# Patient Record
Sex: Male | Born: 1980 | Race: Black or African American | Hispanic: No | Smoking: Former smoker
Health system: Southern US, Community
[De-identification: ages and names within clinical notes are randomized; demographics above are authoritative.]

## PROBLEM LIST (undated history)

## (undated) ENCOUNTER — Emergency Department (HOSPITAL_COMMUNITY): Discharge status: Absent without leave | Payer: Self-pay

## (undated) DIAGNOSIS — Z9109 Other allergy status, other than to drugs and biological substances: Secondary | ICD-10-CM

## (undated) DIAGNOSIS — K469 Unspecified abdominal hernia without obstruction or gangrene: Secondary | ICD-10-CM

## (undated) DIAGNOSIS — I10 Essential (primary) hypertension: Secondary | ICD-10-CM

## (undated) DIAGNOSIS — Z8679 Personal history of other diseases of the circulatory system: Secondary | ICD-10-CM

## (undated) DIAGNOSIS — J45909 Unspecified asthma, uncomplicated: Secondary | ICD-10-CM

## (undated) DIAGNOSIS — J4540 Moderate persistent asthma, uncomplicated: Secondary | ICD-10-CM

## (undated) DIAGNOSIS — R002 Palpitations: Secondary | ICD-10-CM

## (undated) DIAGNOSIS — S8991XA Unspecified injury of right lower leg, initial encounter: Secondary | ICD-10-CM

## (undated) HISTORY — DX: Essential (primary) hypertension: I10

---
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
## 2001-03-25 ENCOUNTER — Emergency Department (HOSPITAL_COMMUNITY): Admission: EM | Admit: 2001-03-25 | Discharge: 2001-03-25 | Payer: Self-pay

## 2001-04-10 ENCOUNTER — Emergency Department (HOSPITAL_COMMUNITY): Admission: EM | Admit: 2001-04-10 | Discharge: 2001-04-10 | Payer: Self-pay | Admitting: Emergency Medicine

## 2001-04-10 ENCOUNTER — Encounter: Payer: Self-pay | Admitting: Emergency Medicine

## 2001-11-22 ENCOUNTER — Encounter: Payer: Self-pay | Admitting: Emergency Medicine

## 2001-11-22 ENCOUNTER — Emergency Department (HOSPITAL_COMMUNITY): Admission: EM | Admit: 2001-11-22 | Discharge: 2001-11-22 | Payer: Self-pay | Admitting: Emergency Medicine

## 2002-02-27 ENCOUNTER — Emergency Department (HOSPITAL_COMMUNITY): Admission: EM | Admit: 2002-02-27 | Discharge: 2002-02-27 | Payer: Self-pay | Admitting: Emergency Medicine

## 2002-02-27 ENCOUNTER — Encounter: Payer: Self-pay | Admitting: Emergency Medicine

## 2002-09-17 ENCOUNTER — Encounter: Payer: Self-pay | Admitting: Emergency Medicine

## 2002-09-17 ENCOUNTER — Emergency Department (HOSPITAL_COMMUNITY): Admission: EM | Admit: 2002-09-17 | Discharge: 2002-09-17 | Payer: Self-pay | Admitting: Emergency Medicine

## 2003-09-27 ENCOUNTER — Emergency Department (HOSPITAL_COMMUNITY): Admission: EM | Admit: 2003-09-27 | Discharge: 2003-09-27 | Payer: Self-pay | Admitting: Emergency Medicine

## 2004-01-16 ENCOUNTER — Emergency Department (HOSPITAL_COMMUNITY): Admission: AD | Admit: 2004-01-16 | Discharge: 2004-01-16 | Payer: Self-pay | Admitting: Family Medicine

## 2004-02-23 ENCOUNTER — Emergency Department (HOSPITAL_COMMUNITY): Admission: EM | Admit: 2004-02-23 | Discharge: 2004-02-23 | Payer: Self-pay | Admitting: Emergency Medicine

## 2004-04-12 ENCOUNTER — Emergency Department (HOSPITAL_COMMUNITY): Admission: EM | Admit: 2004-04-12 | Discharge: 2004-04-12 | Payer: Self-pay | Admitting: *Deleted

## 2005-04-02 ENCOUNTER — Emergency Department (HOSPITAL_COMMUNITY): Admission: EM | Admit: 2005-04-02 | Discharge: 2005-04-02 | Payer: Self-pay | Admitting: Emergency Medicine

## 2006-04-03 ENCOUNTER — Emergency Department (HOSPITAL_COMMUNITY): Admission: EM | Admit: 2006-04-03 | Discharge: 2006-04-03 | Payer: Self-pay | Admitting: Family Medicine

## 2006-10-17 ENCOUNTER — Emergency Department (HOSPITAL_COMMUNITY): Admission: EM | Admit: 2006-10-17 | Discharge: 2006-10-17 | Payer: Self-pay | Admitting: Family Medicine

## 2007-02-10 ENCOUNTER — Emergency Department (HOSPITAL_COMMUNITY): Admission: EM | Admit: 2007-02-10 | Discharge: 2007-02-11 | Payer: Self-pay | Admitting: Emergency Medicine

## 2011-07-29 ENCOUNTER — Emergency Department (HOSPITAL_COMMUNITY)
Admission: EM | Admit: 2011-07-29 | Discharge: 2011-07-30 | Disposition: A | Discharge status: Home / Self Care | Payer: Self-pay | Attending: Emergency Medicine | Admitting: Emergency Medicine

## 2011-07-29 DIAGNOSIS — J45909 Unspecified asthma, uncomplicated: Secondary | ICD-10-CM | POA: Insufficient documentation

## 2011-07-29 DIAGNOSIS — K089 Disorder of teeth and supporting structures, unspecified: Secondary | ICD-10-CM | POA: Insufficient documentation

## 2013-06-23 ENCOUNTER — Encounter (HOSPITAL_COMMUNITY): Payer: Self-pay | Admitting: Emergency Medicine

## 2013-06-23 ENCOUNTER — Emergency Department (HOSPITAL_COMMUNITY): Payer: No Typology Code available for payment source

## 2013-06-23 ENCOUNTER — Emergency Department (HOSPITAL_COMMUNITY)
Admission: EM | Admit: 2013-06-23 | Discharge: 2013-06-23 | Disposition: A | Discharge status: Home / Self Care | Payer: No Typology Code available for payment source | Attending: Emergency Medicine | Admitting: Emergency Medicine

## 2013-06-23 DIAGNOSIS — K5289 Other specified noninfective gastroenteritis and colitis: Secondary | ICD-10-CM | POA: Insufficient documentation

## 2013-06-23 DIAGNOSIS — J45909 Unspecified asthma, uncomplicated: Secondary | ICD-10-CM | POA: Insufficient documentation

## 2013-06-23 DIAGNOSIS — K529 Noninfective gastroenteritis and colitis, unspecified: Secondary | ICD-10-CM

## 2013-06-23 DIAGNOSIS — F17200 Nicotine dependence, unspecified, uncomplicated: Secondary | ICD-10-CM | POA: Insufficient documentation

## 2013-06-23 DIAGNOSIS — R197 Diarrhea, unspecified: Secondary | ICD-10-CM | POA: Insufficient documentation

## 2013-06-23 DIAGNOSIS — Z8719 Personal history of other diseases of the digestive system: Secondary | ICD-10-CM | POA: Insufficient documentation

## 2013-06-23 DIAGNOSIS — R112 Nausea with vomiting, unspecified: Secondary | ICD-10-CM | POA: Insufficient documentation

## 2013-06-23 HISTORY — DX: Unspecified asthma, uncomplicated: J45.909

## 2013-06-23 HISTORY — DX: Unspecified abdominal hernia without obstruction or gangrene: K46.9

## 2013-06-23 LAB — COMPREHENSIVE METABOLIC PANEL
Albumin: 4.5 g/dL (ref 3.5–5.2)
Alkaline Phosphatase: 61 U/L (ref 39–117)
BUN: 10 mg/dL (ref 6–23)
Calcium: 9.6 mg/dL (ref 8.4–10.5)
GFR calc Af Amer: >90 mL/min (ref >90)
Glucose, Bld: 105 mg/dL — ABNORMAL HIGH (ref 70–99)
Potassium: 3.4 mEq/L — ABNORMAL LOW (ref 3.5–5.1)
Total Protein: 7.8 g/dL (ref 6.0–8.3)

## 2013-06-23 LAB — CBC WITH DIFFERENTIAL/PLATELET
Basophils Absolute: 0 10*3/uL (ref 0.0–0.1)
Eosinophils Relative: 1 % (ref 0–5)
HCT: 50.3 % (ref 39.0–52.0)
Hemoglobin: 17.5 g/dL — ABNORMAL HIGH (ref 13.0–17.0)
Lymphocytes Relative: 12 % (ref 12–46)
Lymphs Abs: 1.2 10*3/uL (ref 0.7–4.0)
MCV: 87.6 fL (ref 78.0–100.0)
Monocytes Absolute: 0.6 10*3/uL (ref 0.1–1.0)
Monocytes Relative: 6 % (ref 3–12)
Neutro Abs: 8.2 10*3/uL — ABNORMAL HIGH (ref 1.7–7.7)
RBC: 5.74 MIL/uL (ref 4.22–5.81)
RDW: 12.9 % (ref 11.5–15.5)
WBC: 10.1 10*3/uL (ref 4.0–10.5)

## 2013-06-23 MED ORDER — ONDANSETRON HCL 4 MG/2ML IJ SOLN
4.0000 mg | Freq: Once | INTRAMUSCULAR | Status: AC
  Administered 2013-06-23: 4 mg via INTRAVENOUS
  Filled 2013-06-23: qty 2

## 2013-06-23 MED ORDER — SODIUM CHLORIDE 0.9 % IV SOLN: Freq: Once | INTRAVENOUS | Status: DC

## 2013-06-23 MED ORDER — LOPERAMIDE HCL 2 MG PO CAPS
4.0000 mg | ORAL_CAPSULE | Freq: Once | ORAL | Status: AC
  Administered 2013-06-23: 4 mg via ORAL
  Filled 2013-06-23: qty 2

## 2013-06-23 MED ORDER — SODIUM CHLORIDE 0.9 % IV SOLN
1000.0000 mL | Freq: Once | INTRAVENOUS | Status: AC
  Administered 2013-06-23: 1000 mL via INTRAVENOUS

## 2013-06-23 MED ORDER — IOHEXOL 300 MG/ML  SOLN
100.0000 mL | Freq: Once | INTRAMUSCULAR | Status: AC | PRN
  Administered 2013-06-23: 100 mL via INTRAVENOUS

## 2013-06-23 MED ORDER — ONDANSETRON HCL 4 MG PO TABS: 4.0000 mg | ORAL_TABLET | Freq: Four times a day (QID) | ORAL | Status: DC | PRN

## 2013-06-23 MED ORDER — MORPHINE SULFATE 4 MG/ML IJ SOLN
4.0000 mg | Freq: Once | INTRAMUSCULAR | Status: DC
  Filled 2013-06-23: qty 1

## 2013-06-23 MED ORDER — GI COCKTAIL ~~LOC~~
30.0000 mL | Freq: Once | ORAL | Status: AC
  Administered 2013-06-23: 30 mL via ORAL
  Filled 2013-06-23: qty 30

## 2013-06-23 MED ORDER — ONDANSETRON HCL 4 MG/2ML IJ SOLN: 4.0000 mg | Freq: Once | INTRAMUSCULAR | Status: DC

## 2013-06-23 MED ORDER — FENTANYL CITRATE 0.05 MG/ML IJ SOLN
50.0000 ug | Freq: Once | INTRAMUSCULAR | Status: AC
  Administered 2013-06-23: 50 ug via INTRAVENOUS
  Filled 2013-06-23: qty 2

## 2013-06-23 NOTE — ED Notes (Signed)
PT. REPORTS DIARRHEA YESTERDAY , EMESIS LAST NIGHT WITH RIGHT ABDOMINAL PAIN , DENIES FEVER OR CHILLS.

## 2013-06-23 NOTE — ED Provider Notes (Signed)
History    CSN: [id number] Arrival date & time 06/23/13  0222  First MD Initiated Contact with Patient 06/23/13 0305     Chief Complaint  Patient presents with  . Abdominal Pain  . Diarrhea  . Emesis   (Consider location/radiation/quality/duration/timing/severity/associated sxs/prior Treatment) The history is provided by the patient.  32 year old male comes in with complaints of abdominal pain, nausea, vomiting, diarrhea. Symptoms started yesterday with diarrhea and he did develop nausea and vomited once last night. Today, his develop pain in the right mid and upper abdomen without radiation. Pain is severe and he rates it at 9/10. It is not improved with vomiting or with passing stool. He has continued to have watery diarrhea throughout the day today. He denies fever, chills, sweats. He's not done anything to try and treat this. He denies any sick contacts. Past Medical History  Diagnosis Date  . Asthma   . Hernia    History reviewed. No pertinent past surgical history. History reviewed. No pertinent family history. History  Substance Use Topics  . Smoking status: Current Every Day Smoker  . Smokeless tobacco: Not on file  . Alcohol Use: Yes    Review of Systems  All other systems reviewed and are negative.    Allergies  Review of patient's allergies indicates no known allergies.  Home Medications  No current outpatient prescriptions on file. BP 143/71  Pulse 81  Temp(Src) 98.8 F (37.1 C) (Oral)  Resp 20  SpO2 97% Physical Exam  Nursing note and vitals reviewed.  32 year old male, resting comfortably and in no acute distress. Vital signs are significant for borderline hypertension with blood pressure 143/71. Oxygen saturation is 97%, which is normal. Head is normocephalic and atraumatic. PERRLA, EOMI. Oropharynx is clear. Neck is nontender and supple without adenopathy or JVD. Back is nontender and there is no CVA tenderness. Lungs are clear without rales,  wheezes, or rhonchi. Chest is nontender. Heart has regular rate and rhythm without murmur. Abdomen is soft, flat, with moderate tenderness in the right mid and upper abdomen. There is solitary guarding but no rebound tenderness is present. There are no masses or hepatosplenomegaly and peristalsis is hypoactive. Extremities have no cyanosis or edema, full range of motion is present. Skin is warm and dry without rash. Neurologic: Mental status is normal, cranial nerves are intact, there are no motor or sensory deficits.  ED Course  Procedures (including critical care time) Results for orders placed during the hospital encounter of 06/23/13  LIPASE, BLOOD      Result Value Range   Lipase 15  11 - 59 U/L  COMPREHENSIVE METABOLIC PANEL      Result Value Range   Sodium 139  135 - 145 mEq/L   Potassium 3.4 (*) 3.5 - 5.1 mEq/L   Chloride 100  96 - 112 mEq/L   CO2 27  19 - 32 mEq/L   Glucose, Bld 105 (*) 70 - 99 mg/dL   BUN 10  6 - 23 mg/dL   Creatinine, Ser 4.09  0.50 - 1.35 mg/dL   Calcium 9.6  8.4 - 81.1 mg/dL   Total Protein 7.8  6.0 - 8.3 g/dL   Albumin 4.5  3.5 - 5.2 g/dL   AST 23  0 - 37 U/L   ALT 11  0 - 53 U/L   Alkaline Phosphatase 61  39 - 117 U/L   Total Bilirubin 0.7  0.3 - 1.2 mg/dL   GFR calc non Af Amer 86 (*) >  90 mL/min   GFR calc Af Amer >90  >90 mL/min  CBC WITH DIFFERENTIAL      Result Value Range   WBC 10.1  4.0 - 10.5 K/uL   RBC 5.74  4.22 - 5.81 MIL/uL   Hemoglobin 17.5 (*) 13.0 - 17.0 g/dL   HCT 95.2  84.1 - 32.4 %   MCV 87.6  78.0 - 100.0 fL   MCH 30.5  26.0 - 34.0 pg   MCHC 34.8  30.0 - 36.0 g/dL   RDW 40.1  02.7 - 25.3 %   Platelets 157  150 - 400 K/uL   Neutrophils Relative % 81 (*) 43 - 77 %   Neutro Abs 8.2 (*) 1.7 - 7.7 K/uL   Lymphocytes Relative 12  12 - 46 %   Lymphs Abs 1.2  0.7 - 4.0 K/uL   Monocytes Relative 6  3 - 12 %   Monocytes Absolute 0.6  0.1 - 1.0 K/uL   Eosinophils Relative 1  0 - 5 %   Eosinophils Absolute 0.1  0.0 - 0.7 K/uL    Basophils Relative 0  0 - 1 %   Basophils Absolute 0.0  0.0 - 0.1 K/uL   Ct Abdomen Pelvis W Contrast  06/23/2013   *RADIOLOGY REPORT*  Clinical Data: Diarrhea.  Emesis last night with right side abdominal pain.  CT ABDOMEN AND PELVIS WITH CONTRAST  Technique:  Multidetector CT imaging of the abdomen and pelvis was performed following the standard protocol during bolus administration of intravenous contrast.  Contrast: OMNIPAQUE IOHEXOL 300 MG/ML  SOLN  Comparison: Plain film of 10/17/2006.  No prior CT.  Findings: Lung bases:  Normal  Abdomen/pelvis:  Normal liver, spleen, stomach, pancreas, gallbladder, biliary tract, adrenal glands, left kidney.  Right extrarenal pelvis.  No retroperitoneal or retrocrural adenopathy.  Normal colon and terminal ileum.  Appendix is not visualized but there is no evidence of right lower quadrant inflammation.  Normal small bowel without abdominal ascites.    No pelvic adenopathy.    Normal urinary bladder and prostate.  No significant free fluid.  Bones/Musculoskeletal:  No acute osseous abnormality.  IMPRESSION:  1. No acute process in the abdomen or pelvis. 2.  Lack of visualization of the appendix.  No right lower quadrant inflammation seen.   Original Report Authenticated By: Jeronimo Greaves, M.D.     1. Gastroenteritis     MDM  Vomiting, diarrhea, abdominal pain which may be viral gastroenteritis. However, abdominal tenderness seems out of proportion. WBC is come back normal with a left shift. He will be sent for CT scan. He is also given IV fluids, IV morphine, and IV ondansetron.  CT is unremarkable. He states that he is feeling somewhat better but still having intermittent cramping pain. He is given a dose of loperamide. He started to complain of some epigastric pain and is given a dose of a GI cocktail. Following this, he is sleeping comfortably. Clinically, he seems to have a viral gastroenteritis. He is discharged with a prescription for ondansetron and is  told to use over-the-counter loperamide as needed for diarrhea.  Dione Booze, MD 06/23/13 [phone]

## 2013-06-23 NOTE — ED Notes (Signed)
Patient presents with c/o RLQ pain that started 2 days ago had diarrhea yesterday and this AM he started vomiting.  Also has a hernia above his umbilicus.  When palpation to the RLQ, patient grimaces and grabs hand.

## 2013-06-24 ENCOUNTER — Telehealth: Payer: Self-pay | Admitting: Family Medicine

## 2013-06-24 NOTE — Telephone Encounter (Signed)
Pt aware, appt made w/ Dr Shary Decamp

## 2013-06-24 NOTE — Telephone Encounter (Signed)
Pt's mom called and would like to know if you will accept her son, the pt. as a new pt? Pt was seen in ED on Sun for stomach issues.

## 2013-06-24 NOTE — Telephone Encounter (Signed)
Sorry but I'm not taking new patients now

## 2013-06-25 ENCOUNTER — Encounter: Payer: Self-pay | Admitting: Internal Medicine

## 2013-06-25 ENCOUNTER — Ambulatory Visit (INDEPENDENT_AMBULATORY_CARE_PROVIDER_SITE_OTHER): Payer: No Typology Code available for payment source | Admitting: Internal Medicine

## 2013-06-25 VITALS — BP 128/80 | HR 68 | Temp 98.2°F | Resp 18 | Ht 73.25 in | Wt 162.0 lb

## 2013-06-25 DIAGNOSIS — J4520 Mild intermittent asthma, uncomplicated: Secondary | ICD-10-CM

## 2013-06-25 DIAGNOSIS — Z720 Tobacco use: Secondary | ICD-10-CM | POA: Insufficient documentation

## 2013-06-25 DIAGNOSIS — Z23 Encounter for immunization: Secondary | ICD-10-CM

## 2013-06-25 DIAGNOSIS — J45909 Unspecified asthma, uncomplicated: Secondary | ICD-10-CM | POA: Insufficient documentation

## 2013-06-25 DIAGNOSIS — F17200 Nicotine dependence, unspecified, uncomplicated: Secondary | ICD-10-CM

## 2013-06-25 HISTORY — DX: Unspecified asthma, uncomplicated: J45.909

## 2013-06-25 MED ORDER — ALBUTEROL SULFATE HFA 108 (90 BASE) MCG/ACT IN AERS: 2.0000 | INHALATION_SPRAY | Freq: Four times a day (QID) | RESPIRATORY_TRACT | Status: DC | PRN

## 2013-06-25 NOTE — Progress Notes (Signed)
  Subjective:    Patient ID: Austin Curry, male    DOB: Jul 29, 1981, 32 y.o.   MRN: [id number]  HPI   32 year old patient who is seen today to establish with our practice. He recently had a EEG visit do to nausea and vomiting and abdominal pain. These symptoms have resolved.  Past medical history is remarkable for a history of asthma. He presently uses when necessary albuterol and has only mild seasonal asthma. Past medical  history otherwise unremarkable.  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 Gradually the patient's high school. [employer]  Family history father in good health Mother history of diabetes and coronary artery disease Paternal grandmother with colon cancer    Review of Systems  Constitutional: Negative for fever, chills, appetite change and fatigue.  HENT: Negative for hearing loss, ear pain, congestion, sore throat, trouble swallowing, neck stiffness, dental problem, voice change and tinnitus.   Eyes: Negative for pain, discharge and visual disturbance.  Respiratory: Negative for cough, chest tightness, wheezing and stridor.   Cardiovascular: Negative for chest pain, palpitations and leg swelling.  Gastrointestinal: Negative for nausea, vomiting, abdominal pain, diarrhea, constipation, blood in stool and abdominal distention.  Genitourinary: Negative for urgency, hematuria, flank pain, discharge, difficulty urinating and genital sores.  Musculoskeletal: Negative for myalgias, back pain, joint swelling, arthralgias and gait problem.  Skin: Negative for rash.  Neurological: Negative for dizziness, syncope, speech difficulty, weakness, numbness and headaches.  Hematological: Negative for adenopathy. Does not bruise/bleed easily.  Psychiatric/Behavioral: Negative for behavioral problems and dysphoric mood. The patient is not nervous/anxious.        Objective:   Physical Exam  Constitutional: He appears well-developed and well-nourished.   HENT:  Head: Normocephalic and atraumatic.  Right Ear: External ear normal.  Left Ear: External ear normal.  Nose: Nose normal.  Mouth/Throat: Oropharynx is clear and moist.  Eyes: Conjunctivae and EOM are normal. Pupils are equal, round, and reactive to light. No scleral icterus.  Neck: Normal range of motion. Neck supple. No JVD present. No thyromegaly present.  Cardiovascular: Regular rhythm, normal heart sounds and intact distal pulses.  Exam reveals no gallop and no friction rub.   No murmur heard. Pulmonary/Chest: Effort normal and breath sounds normal. He exhibits no tenderness.  Abdominal: Soft. Bowel sounds are normal. He exhibits no distension and no mass. There is no tenderness.  Small easily reducible ventral hernia in the epigastric area  Genitourinary: Prostate normal and penis normal.  Musculoskeletal: Normal range of motion. He exhibits no edema and no tenderness.  Lymphadenopathy:    He has no cervical adenopathy.  Neurological: He is alert. He has normal reflexes. No cranial nerve deficit. Coordination normal.  Skin: Skin is warm and dry. No rash noted.  Psychiatric: He has a normal mood and affect. His behavior is normal.          Assessment & Plan:   Preventive health exam Asthma mild stable Small ventral hernia  Albuterol refilled Return in one year or as needed Smoking cessation encouraged

## 2013-06-25 NOTE — Patient Instructions (Addendum)
It is important that you exercise regularly, at least 20 minutes 3 to 4 times per week.  If you develop chest pain or shortness of breath seek  medical attention.  Smoking tobacco is very bad for your health. You should stop smoking immediately.  Call or return to clinic prn if these symptoms worsen or fail to improve as anticipated. Preventive Care for Adults, Male A healthy lifestyle and preventive care can promote health and wellness. Preventive health guidelines for men include the following key practices:  A routine yearly physical is a good way to check with your caregiver about your health and preventative screening. It is a chance to share any concerns and updates on your health, and to receive a thorough exam.  Visit your dentist for a routine exam and preventative care every 6 months. Brush your teeth twice a day and floss once a day. Good oral hygiene prevents tooth decay and gum disease.  The frequency of eye exams is based on your age, health, family medical history, use of contact lenses, and other factors. Follow your caregiver's recommendations for frequency of eye exams.  Eat a healthy diet. Foods like vegetables, fruits, whole grains, low-fat dairy products, and lean protein foods contain the nutrients you need without too many calories. Decrease your intake of foods high in solid fats, added sugars, and salt. Eat the right amount of calories for you.Get information about a proper diet from your caregiver, if necessary.  Regular physical exercise is one of the most important things you can do for your health. Most adults should get at least 150 minutes of moderate-intensity exercise (any activity that increases your heart rate and causes you to sweat) each week. In addition, most adults need muscle-strengthening exercises on 2 or more days a week.  Maintain a healthy weight. The body mass index (BMI) is a screening tool to identify possible weight problems. It provides an  estimate of body fat based on height and weight. Your caregiver can help determine your BMI, and can help you achieve or maintain a healthy weight.For adults 20 years and older:  A BMI below 18.5 is considered underweight.  A BMI of 18.5 to 24.9 is normal.  A BMI of 25 to 29.9 is considered overweight.  A BMI of 30 and above is considered obese.  Maintain normal blood lipids and cholesterol levels by exercising and minimizing your intake of saturated fat. Eat a balanced diet with plenty of fruit and vegetables. Blood tests for lipids and cholesterol should begin at age 69 and be repeated every 5 years. If your lipid or cholesterol levels are high, you are over 50, or you are a high risk for heart disease, you may need your cholesterol levels checked more frequently.Ongoing high lipid and cholesterol levels should be treated with medicines if diet and exercise are not effective.  If you smoke, find out from your caregiver how to quit. If you do not use tobacco, do not start.  If you choose to drink alcohol, do not exceed 2 drinks per day. One drink is considered to be 12 ounces (355 mL) of beer, 5 ounces (148 mL) of wine, or 1.5 ounces (44 mL) of liquor.  Avoid use of street drugs. Do not share needles with anyone. Ask for help if you need support or instructions about stopping the use of drugs.  High blood pressure causes heart disease and increases the risk of stroke. Your blood pressure should be checked at least every 1 to 2  years. Ongoing high blood pressure should be treated with medicines, if weight loss and exercise are not effective.  If you are 57 to 31 years old, ask your caregiver if you should take aspirin to prevent heart disease.  Diabetes screening involves taking a blood sample to check your fasting blood sugar level. This should be done once every 3 years, after age 11, if you are within normal weight and without risk factors for diabetes. Testing should be considered at a  younger age or be carried out more frequently if you are overweight and have at least 1 risk factor for diabetes.  Colorectal cancer can be detected and often prevented. Most routine colorectal cancer screening begins at the age of 47 and continues through age 10. However, your caregiver may recommend screening at an earlier age if you have risk factors for colon cancer. On a yearly basis, your caregiver may provide home test kits to check for hidden blood in the stool. Use of a small camera at the end of a tube, to directly examine the colon (sigmoidoscopy or colonoscopy), can detect the earliest forms of colorectal cancer. Talk to your caregiver about this at age 53, when routine screening begins. Direct examination of the colon should be repeated every 5 to 10 years through age 79, unless early forms of pre-cancerous polyps or small growths are found.  Hepatitis C blood testing is recommended for all people born from [birth year] through 1965 and any individual with known risks for hepatitis C.  Practice safe sex. Use condoms and avoid high-risk sexual practices to reduce the spread of sexually transmitted infections (STIs). STIs include gonorrhea, chlamydia, syphilis, trichomonas, herpes, HPV, and human immunodeficiency virus (HIV). Herpes, HIV, and HPV are viral illnesses that have no cure. They can result in disability, cancer, and death.  A one-time screening for abdominal aortic aneurysm (AAA) and surgical repair of large AAAs by sound wave imaging (ultrasonography) is recommended for ages 22 to 39 years who are current or former smokers.  Healthy men should no longer receive prostate-specific antigen (PSA) blood tests as part of routine cancer screening. Consult with your caregiver about prostate cancer screening.  Testicular cancer screening is not recommended for adult males who have no symptoms. Screening includes self-exam, caregiver exam, and other screening tests. Consult with your caregiver  about any symptoms you have or any concerns you have about testicular cancer.  Use sunscreen with skin protection factor (SPF) of 30 or more. Apply sunscreen liberally and repeatedly throughout the day. You should seek shade when your shadow is shorter than you. Protect yourself by wearing long sleeves, pants, a wide-brimmed hat, and sunglasses year round, whenever you are outdoors.  Once a month, do a whole body skin exam, using a mirror to look at the skin on your back. Notify your caregiver of new moles, moles that have irregular borders, moles that are larger than a pencil eraser, or moles that have changed in shape or color.  Stay current with required immunizations.  Influenza. You need a dose every fall (or winter). The composition of the flu vaccine changes each year, so being vaccinated once is not enough.  Pneumococcal polysaccharide. You need 1 to 2 doses if you smoke cigarettes or if you have certain chronic medical conditions. You need 1 dose at age 44 (or older) if you have never been vaccinated.  Tetanus, diphtheria, pertussis (Tdap, Td). Get 1 dose of Tdap vaccine if you are younger than age 97 years, are over 34  and have contact with an infant, are a Research scientist (physical sciences), or simply want to be protected from whooping cough. After that, you need a Td booster dose every 10 years. Consult your caregiver if you have not had at least 3 tetanus and diphtheria-containing shots sometime in your life or have a deep or dirty wound.  HPV. This vaccine is recommended for males 13 through 31 years of age. This vaccine may be given to men 22 through 31 years of age who have not completed the 3 dose series. It is recommended for men through age 64 who have sex with men or whose immune system is weakened because of HIV infection, other illness, or medications. The vaccine is given in 3 doses over 6 months.  Measles, mumps, rubella (MMR). You need at least 1 dose of MMR if you were born in 1957 or later.  You may also need a 2nd dose.  Meningococcal. If you are age 69 to 58 years and a Orthoptist living in a residence hall, or have one of several medical conditions, you need to get vaccinated against meningococcal disease. You may also need additional booster doses.  Zoster (shingles). If you are age 21 years or older, you should get this vaccine.  Varicella (chickenpox). If you have never had chickenpox or you were vaccinated but received only 1 dose, talk to your caregiver to find out if you need this vaccine.  Hepatitis A. You need this vaccine if you have a specific risk factor for hepatitis A virus infection, or you simply wish to be protected from this disease. The vaccine is usually given as 2 doses, 6 to 18 months apart.  Hepatitis B. You need this vaccine if you have a specific risk factor for hepatitis B virus infection or you simply wish to be protected from this disease. The vaccine is given in 3 doses, usually over 6 months. Preventative Service / Frequency Ages 8 to 86  Blood pressure check.** / Every 1 to 2 years.  Lipid and cholesterol check.** / Every 5 years beginning at age 40.  Hepatitis C blood test.** / For any individual with known risks for hepatitis C.  Skin self-exam. / Monthly.  Influenza immunization.** / Every year.  Pneumococcal polysaccharide immunization.** / 1 to 2 doses if you smoke cigarettes or if you have certain chronic medical conditions.  Tetanus, diphtheria, pertussis (Tdap,Td) immunization. / A one-time dose of Tdap vaccine. After that, you need a Td booster dose every 10 years.  HPV immunization. / 3 doses over 6 months, if 26 and younger.  Measles, mumps, rubella (MMR) immunization. / You need at least 1 dose of MMR if you were born in 1957 or later. You may also need a 2nd dose.  Meningococcal immunization. / 1 dose if you are age 65 to 43 years and a Orthoptist living in a residence hall, or have one of  several medical conditions, you need to get vaccinated against meningococcal disease. You may also need additional booster doses.  Varicella immunization.** / Consult your caregiver.  Hepatitis A immunization.** / Consult your caregiver. 2 doses, 6 to 18 months apart.  Hepatitis B immunization.** / Consult your caregiver. 3 doses usually over 6 months. Ages 8 to 50  Blood pressure check.** / Every 1 to 2 years.  Lipid and cholesterol check.** / Every 5 years beginning at age 9.  Fecal occult blood test (FOBT) of stool. / Every year beginning at age 3 and continuing until  age 49. You may not have to do this test if you get colonoscopy every 10 years.  Flexible sigmoidoscopy** or colonoscopy.** / Every 5 years for a flexible sigmoidoscopy or every 10 years for a colonoscopy beginning at age 27 and continuing until age 50.  Hepatitis C blood test.** / For all people born from [birth year] through 1965 and any individual with known risks for hepatitis C.  Skin self-exam. / Monthly.  Influenza immunization.** / Every year.  Pneumococcal polysaccharide immunization.** / 1 to 2 doses if you smoke cigarettes or if you have certain chronic medical conditions.  Tetanus, diphtheria, pertussis (Tdap/Td) immunization.** / A one-time dose of Tdap vaccine. After that, you need a Td booster dose every 10 years.  Measles, mumps, rubella (MMR) immunization. / You need at least 1 dose of MMR if you were born in 1957 or later. You may also need a 2nd dose.  Varicella immunization.**/ Consult your caregiver.  Meningococcal immunization.** / Consult your caregiver.  Hepatitis A immunization.** / Consult your caregiver. 2 doses, 6 to 18 months apart.  Hepatitis B immunization.** / Consult your caregiver. 3 doses, usually over 6 months. Ages 65 and over  Blood pressure check.** / Every 1 to 2 years.  Lipid and cholesterol check.**/ Every 5 years beginning at age 91.  Fecal occult blood test (FOBT) of  stool. / Every year beginning at age 60 and continuing until age 70. You may not have to do this test if you get colonoscopy every 10 years.  Flexible sigmoidoscopy** or colonoscopy.** / Every 5 years for a flexible sigmoidoscopy or every 10 years for a colonoscopy beginning at age 26 and continuing until age 109.  Hepatitis C blood test.** / For all people born from [birth year] through 1964 and any individual with known risks for hepatitis C.  Abdominal aortic aneurysm (AAA) screening.** / A one-time screening for ages 63 to 73 years who are current or former smokers.  Skin self-exam. / Monthly.  Influenza immunization.** / Every year.  Pneumococcal polysaccharide immunization.** / 1 dose at age 73 (or older) if you have never been vaccinated.  Tetanus, diphtheria, pertussis (Tdap, Td) immunization. / A one-time dose of Tdap vaccine if you are over 65 and have contact with an infant, are a Research scientist (physical sciences), or simply want to be protected from whooping cough. After that, you need a Td booster dose every 10 years.  Varicella immunization. ** / Consult your caregiver.  Meningococcal immunization.** / Consult your caregiver.  Hepatitis A immunization. ** / Consult your caregiver. 2 doses, 6 to 18 months apart.  Hepatitis B immunization.** / Check with your caregiver. 3 doses, usually over 6 months. **Family history and personal history of risk and conditions may change your caregiver's recommendations. Document Released: 02/07/2002 Document Revised: 03/05/2012 Document Reviewed: 05/09/2011 [facility] Patient Information 2014 [place], [place].

## 2013-09-11 ENCOUNTER — Ambulatory Visit (INDEPENDENT_AMBULATORY_CARE_PROVIDER_SITE_OTHER): Payer: No Typology Code available for payment source | Admitting: *Deleted

## 2013-09-11 DIAGNOSIS — Z299 Encounter for prophylactic measures, unspecified: Secondary | ICD-10-CM

## 2013-10-31 ENCOUNTER — Other Ambulatory Visit: Payer: Self-pay

## 2014-03-24 ENCOUNTER — Encounter (HOSPITAL_COMMUNITY): Payer: Self-pay | Admitting: Emergency Medicine

## 2014-03-24 ENCOUNTER — Emergency Department (INDEPENDENT_AMBULATORY_CARE_PROVIDER_SITE_OTHER)
Admission: EM | Admit: 2014-03-24 | Discharge: 2014-03-24 | Disposition: A | Discharge status: Home / Self Care | Payer: No Typology Code available for payment source | Source: Home / Self Care | Attending: Emergency Medicine | Admitting: Emergency Medicine

## 2014-03-24 DIAGNOSIS — L0390 Cellulitis, unspecified: Secondary | ICD-10-CM

## 2014-03-24 DIAGNOSIS — L0291 Cutaneous abscess, unspecified: Secondary | ICD-10-CM

## 2014-03-24 MED ORDER — MUPIROCIN 2 % EX OINT: TOPICAL_OINTMENT | CUTANEOUS | Status: DC

## 2014-03-24 MED ORDER — SULFAMETHOXAZOLE-TMP DS 800-160 MG PO TABS: 2.0000 | ORAL_TABLET | Freq: Two times a day (BID) | ORAL | Status: DC

## 2014-03-24 NOTE — ED Provider Notes (Addendum)
  Chief Complaint   Chief Complaint  Patient presents with  . Abscess    History of Present Illness   Austin Curry is a 84[name]3 year old male who has had a one-week history of a small abscess in his right temple. This is draining a small amount of pus. He denies any fever or chills. No other skin lesions. No prior history of abscesses or skin infections.  Review of Systems   Other than as noted above, the patient denies any of the following symptoms: Systemic:  No fever, chills or sweats. Skin:  No rash or itching.  PMFSH   Past medical history, family history, social history, meds, and allergies were reviewed. No history of diabetes.  Physical Examination     Vital signs:  BP 141/79  Pulse 57  Temp(Src) 98.5 F (36.9 C) (Oral)  Resp 18  SpO2 100% Skin:  There is a 1 cm, firm, raised, red papule in the right temporal area. This was not fluctuant, was mildly tender, was not draining any pus.  Skin exam was otherwise normal.  No rash. Ext:  Distal pulses were full, patient has full ROM of all joints.  Assessment   The encounter diagnosis was Abscess.  No need for I&D.  Plan     1.  Meds:  The following meds were prescribed:   Discharge Medication List as of 03/24/2014  8:47 PM    START taking these medications   Details  mupirocin ointment (BACTROBAN) 2 % Apply QID, Normal    sulfamethoxazole-trimethoprim (BACTRIM DS) 800-160 MG per tablet Take 2 tablets by mouth 2 (two) times daily., Starting 03/24/2014, Until Discontinued, Normal        2.  Patient Education/Counseling:  The patient was given appropriate handouts, self care instructions, and instructed in symptomatic relief.  Suggested moist warm compresses, antibiotics, and mupirocin ointment to be applied topically. Return again if this hasn't completely healed up in 2 weeks.  3.  Follow up:  The patient was instructed to return again if this hasn't completely healed in 2 weeks or sooner if it should get  worse.    [name], MD 03/24/14 2118  [name], MD 03/24/14 2118

## 2014-03-24 NOTE — ED Notes (Addendum)
Thought he had a ringworm on R temple onset 3/20. He applied Blue star ointment. Then it started swelling. He squeezed it and pus came out 3/26. 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

## 2014-03-24 NOTE — Discharge Instructions (Signed)
Abscess An abscess is an infected area that contains a collection of pus and debris.It can occur in almost any part of the body. An abscess is also known as a furuncle or boil. CAUSES  An abscess occurs when tissue gets infected. This can occur from blockage of oil or sweat glands, infection of hair follicles, or a minor injury to the skin. As the body tries to fight the infection, pus collects in the area and creates pressure under the skin. This pressure causes pain. People with weakened immune systems have difficulty fighting infections and get certain abscesses more often.  SYMPTOMS Usually an abscess develops on the skin and becomes a painful mass that is red, warm, and tender. If the abscess forms under the skin, you may feel a moveable soft area under the skin. Some abscesses break open (rupture) on their own, but most will continue to get worse without care. The infection can spread deeper into the body and eventually into the bloodstream, causing you to feel ill.  DIAGNOSIS  Your caregiver will take your medical history and perform a physical exam. A sample of fluid may also be taken from the abscess to determine what is causing your infection. TREATMENT  Your caregiver may prescribe antibiotic medicines to fight the infection. However, taking antibiotics alone usually does not cure an abscess. Your caregiver may need to make a small cut (incision) in the abscess to drain the pus. In some cases, gauze is packed into the abscess to reduce pain and to continue draining the area. HOME CARE INSTRUCTIONS   Only take over-the-counter or prescription medicines for pain, discomfort, or fever as directed by your caregiver.  If you were prescribed antibiotics, take them as directed. Finish them even if you start to feel better.  If gauze is used, follow your caregiver's directions for changing the gauze.  To avoid spreading the infection:  Keep your draining abscess covered with a  bandage.  Wash your hands well.  Do not share personal care items, towels, or whirlpools with others.  Avoid skin contact with others.  Keep your skin and clothes clean around the abscess.  Keep all follow-up appointments as directed by your caregiver. SEEK MEDICAL CARE IF:   You have increased pain, swelling, redness, fluid drainage, or bleeding.  You have muscle aches, chills, or a general ill feeling.  You have a fever. MAKE SURE YOU:   Understand these instructions.  Will watch your condition.  Will get help right away if you are not doing well or get worse. Document Released: 09/21/2005 Document Revised: 06/12/2012 Document Reviewed: 02/24/2012 [employer] Patient Information 2014 [employer].  

## 2014-03-26 ENCOUNTER — Telehealth: Payer: Self-pay | Admitting: Internal Medicine

## 2014-03-26 NOTE — Telephone Encounter (Signed)
Patient taking Bactrim DS for abscess on the side of his head.  [facility]  States the bottle tells him to take 2 tabs twice daily.  Wants to know if this is correct.  Per Epic, Rx written for Bactrim DS, 2 tabs BID x 10 days.  Advised to take two pills twice daily until finished.  No further questions or concerns at this time.  krs/can

## 2014-04-17 ENCOUNTER — Ambulatory Visit (INDEPENDENT_AMBULATORY_CARE_PROVIDER_SITE_OTHER): Payer: Self-pay | Admitting: Physician Assistant

## 2014-04-17 ENCOUNTER — Encounter: Payer: Self-pay | Admitting: Physician Assistant

## 2014-04-17 ENCOUNTER — Telehealth: Payer: Self-pay | Admitting: Internal Medicine

## 2014-04-17 VITALS — BP 124/88 | HR 95 | Temp 98.9°F | Resp 16 | Wt 154.0 lb

## 2014-04-17 DIAGNOSIS — J45901 Unspecified asthma with (acute) exacerbation: Secondary | ICD-10-CM

## 2014-04-17 DIAGNOSIS — J3089 Other allergic rhinitis: Secondary | ICD-10-CM

## 2014-04-17 MED ORDER — ALBUTEROL SULFATE HFA 108 (90 BASE) MCG/ACT IN AERS: 2.0000 | INHALATION_SPRAY | Freq: Four times a day (QID) | RESPIRATORY_TRACT | Status: DC | PRN

## 2014-04-17 MED ORDER — BECLOMETHASONE DIPROPIONATE 80 MCG/ACT IN AERS: 1.0000 | INHALATION_SPRAY | Freq: Two times a day (BID) | RESPIRATORY_TRACT | Status: DC

## 2014-04-17 NOTE — Progress Notes (Signed)
Pre visit review using our clinic review tool, if applicable. No additional management support is needed unless otherwise documented below in the visit note. 

## 2014-04-17 NOTE — Patient Instructions (Addendum)
You should have refills on your albuterol inhaler at the pharmacy, 2 puffs by mouth every 6 hours as needed for rescue symptoms.  Qvar 80 mcg inhaler one inhalation twice a day for symptom control.  Force NON dairy fluids .    OTC Flonase OR Nasacort AQ 1 spray in each nostril twice a day as needed. Use the "crossover" technique into opposite nostril spraying toward opposite ear @ 45 degree angle, not straight up into nostril.   Plain OTC Allegra (NOT D )  160 daily , OTC Loratidine 10 mg , OR OTC Zyrtec 10 mg @ bedtime  as needed for itchy eyes & sneezing.  Followup is symptoms worsen or do not improve despite treatment. Asthma Attack Prevention Although there is no way to prevent asthma from starting, you can take steps to control the disease and reduce its symptoms. Learn about your asthma and how to control it. Take an active role to control your asthma by working with your health care provider to create and follow an asthma action plan. An asthma action plan guides you in:  Taking your medicines properly.  Avoiding things that set off your asthma or make your asthma worse (asthma triggers).  Tracking your level of asthma control.  Responding to worsening asthma.  Seeking emergency care when needed. To track your asthma, keep records of your symptoms, check your peak flow number using a handheld device that shows how well air moves out of your lungs (peak flow meter), and get regular asthma checkups.  WHAT ARE SOME WAYS TO PREVENT AN ASTHMA ATTACK?  Take medicines as directed by your health care provider.  Keep track of your asthma symptoms and level of control.  With your health care provider, write a detailed plan for taking medicines and managing an asthma attack. Then be sure to follow your action plan. Asthma is an ongoing condition that needs regular monitoring and treatment.  Identify and avoid asthma triggers. Many outdoor allergens and irritants (such as pollen, mold,  cold air, and air pollution) can trigger asthma attacks. Find out what your asthma triggers are and take steps to avoid them.  Monitor your breathing. Learn to recognize warning signs of an attack, such as coughing, wheezing, or shortness of breath. Your lung function may decrease before you notice any signs or symptoms, so regularly measure and record your peak airflow with a home peak flow meter.  Identify and treat attacks early. If you act quickly, you are less likely to have a severe attack. You will also need less medicine to control your symptoms. When your peak flow measurements decrease and alert you to an upcoming attack, take your medicine as instructed and immediately stop any activity that may have triggered the attack. If your symptoms do not improve, get medical help.  Pay attention to increasing quick-relief inhaler use. If you find yourself relying on your quick-relief inhaler, your asthma is not under control. See your health care provider about adjusting your treatment. WHAT CAN MAKE MY SYMPTOMS WORSE? A number of common things can set off or make your asthma symptoms worse and cause temporary increased inflammation of your airways. Keep track of your asthma symptoms for several weeks, detailing all the environmental and emotional factors that are linked with your asthma. When you have an asthma attack, go back to your asthma diary to see which factor, or combination of factors, might have contributed to it. Once you know what these factors are, you can take steps to control  many of them. If you have allergies and asthma, it is important to take asthma prevention steps at home. Minimizing contact with the substance to which you are allergic will help prevent an asthma attack. Some triggers and ways to avoid these triggers are: Animal Dander:  Some people are allergic to the flakes of skin or dried saliva from animals with fur or feathers.   There is no such thing as a hypoallergenic  dog or cat breed. All dogs or cats can cause allergies, even if they don't shed.  Keep these pets out of your home.  If you are not able to keep a pet outdoors, keep the pet out of your bedroom and other sleeping areas at all times, and keep the door closed.  Remove carpets and furniture covered with cloth from your home. If that is not possible, keep the pet away from fabric-covered furniture and carpets. Dust Mites: Many people with asthma are allergic to dust mites. Dust mites are tiny bugs that are found in every home in mattresses, pillows, carpets, fabric-covered furniture, bedcovers, clothes, stuffed toys, and other fabric-covered items.   Cover your mattress in a special dust-proof cover.  Cover your pillow in a special dust-proof cover, or wash the pillow each week in hot water. Water must be hotter than 130 F (54.4 C) to kill dust mites. Cold or warm water used with detergent and bleach can also be effective.  Wash the sheets and blankets on your bed each week in hot water.  Try not to sleep or lie on cloth-covered cushions.  Call ahead when traveling and ask for a smoke-free hotel room. Bring your own bedding and pillows in case the hotel only supplies feather pillows and down comforters, which may contain dust mites and cause asthma symptoms.  Remove carpets from your bedroom and those laid on concrete, if you can.  Keep stuffed toys out of the bed, or wash the toys weekly in hot water or cooler water with detergent and bleach. Cockroaches: Many people with asthma are allergic to the droppings and remains of cockroaches.   Keep food and garbage in closed containers. Never leave food out.  Use poison baits, traps, powders, gels, or paste (for example, boric acid).  If a spray is used to kill cockroaches, stay out of the room until the odor goes away. Indoor Mold:  Fix leaky faucets, pipes, or other sources of water that have mold around them.  Clean floors and moldy  surfaces with a fungicide or diluted bleach.  Avoid using humidifiers, vaporizers, or swamp coolers. These can spread molds through the air. Pollen and Outdoor Mold:  When pollen or mold spore counts are high, try to keep your windows closed.  Stay indoors with windows closed from late morning to afternoon. Pollen and some mold spore counts are highest at that time.  Ask your health care provider whether you need to take anti-inflammatory medicine or increase your dose of the medicine before your allergy season starts. Other Irritants to Avoid:  Tobacco smoke is an irritant. If you smoke, ask your health care provider how you can quit. Ask family members to quit smoking too. Do not allow smoking in your home or car.  If possible, do not use a wood-burning stove, kerosene heater, or fireplace. Minimize exposure to all sources of smoke, including to incense, candles, fires, and fireworks.  Try to stay away from strong odors and sprays, such as perfume, talcum powder, hair spray, and paints.  Decrease  humidity in your home and use an indoor air cleaning device. Reduce indoor humidity to below 60%. Dehumidifiers or central air conditioners can do this.  Decrease house dust exposure by changing furnace and air cooler filters frequently.  Try to have someone else vacuum for you once or twice a week. Stay out of rooms while they are being vacuumed and for a short while afterward.  If you vacuum, use a dust mask from a hardware store, a double-layered or microfilter vacuum cleaner bag, or a vacuum cleaner with a HEPA filter.  Sulfites in foods and beverages can be irritants. Do not drink beer or wine or eat dried fruit, processed potatoes, or shrimp if they cause asthma symptoms.  Cold air can trigger an asthma attack. Cover your nose and mouth with a scarf on cold or windy days.  Several health conditions can make asthma more difficult to manage, including a runny nose, sinus infections,  reflux disease, psychological stress, and sleep apnea. Work with your health care provider to manage these conditions.  Avoid close contact with people who have a respiratory infection such as a cold or the flu, since your asthma symptoms may get worse if you catch the infection. Wash your hands thoroughly after touching items that may have been handled by people with a respiratory infection.  Get a flu shot every year to protect against the flu virus, which often makes asthma worse for days or weeks. Also get a pneumonia shot if you have not previously had one. Unlike the flu shot, the pneumonia shot does not need to be given yearly. Medicines:  Talk to your health care provider about whether it is safe for you to take aspirin or non-steroidal anti-inflammatory medicines (NSAIDs). In a small number of people with asthma, aspirin and NSAIDs can cause asthma attacks. These medicines must be avoided by people who have known aspirin-sensitive asthma. It is important that people with aspirin-sensitive asthma read labels of all over-the-counter medicines used to treat pain, colds, coughs, and fever.  Beta blockers and ACE inhibitors are other medicines you should discuss with your health care provider. HOW CAN I FIND OUT WHAT I AM ALLERGIC TO? Ask your asthma health care provider about allergy skin testing or blood testing (the RAST test) to identify the allergens to which you are sensitive. If you are found to have allergies, the most important thing to do is to try to avoid exposure to any allergens that you are sensitive to as much as possible. Other treatments for allergies, such as medicines and allergy shots (immunotherapy) are available.  CAN I EXERCISE? Follow your health care provider's advice regarding asthma treatment before exercising. It is important to maintain a regular exercise program, but vigorous exercise, or exercise in cold, humid, or dry environments can cause asthma attacks, especially  for those people who have exercise-induced asthma. Document Released: 11/30/2009 Document Revised: 08/14/2013 Document Reviewed: 06/19/2013 [facility] Patient Information 2014 [place], [place].

## 2014-04-17 NOTE — Progress Notes (Signed)
Subjective:    Patient ID: Austin Curry, male    DOB: Aug 12, 1981, 33 y.o.   MRN: [id number]  Asthma His past medical history is significant for asthma.   Patient is a 33 year old African American male with a history of asthma presenting today for an asthma attack he experienced yesterday with symptoms of wheezing and shortness of breath.. Patient states that as the weather has gotten warmer his asthma has progressively gotten worse with him sometimes having to use his inhaler as many times as 4 times per day, as was the case for yesterday, as well as around 4 nighttime symptom episodes per month. Patient currently only has an albuterol rescue inhaler which he carries with him daily. Patient states that yesterday after his albuterol inhaler hadn't worked all day, he used his brother's nebulizer treatment, which he states helped him dramatically. He states that seasonal allergies make his symptoms worse, however he is not currently taking any allergy medicine. This morning he states he is no longer feeling symptoms. He denies fevers, chills, nausea, vomiting, diarrhea.   Review of Systems As per the history of present illness and are otherwise negative.  Past Medical History  Diagnosis Date  . Asthma   . Hernia    History reviewed. No pertinent past surgical history.  reports that he has been smoking Cigarettes.  He has been smoking about 0.25 packs per day. He has never used smokeless tobacco. He reports that he drinks alcohol. He reports that he uses illicit drugs (Marijuana). family history includes Diabetes in his mother; Hyperlipidemia in his father; Hypertension in his mother. No Known Allergies     Objective:   Physical Exam  Nursing note and vitals reviewed. Constitutional: He is oriented to person, place, and time. He appears well-developed and well-nourished. No distress.  HENT:  Head: Normocephalic and atraumatic.  Right Ear: External ear normal.  Left Ear: External ear  normal.  Nose: Nose normal.  Mouth/Throat: Oropharynx is clear and moist. No oropharyngeal exudate.  Eyes: Conjunctivae and EOM are normal. Pupils are equal, round, and reactive to light.  Neck: Normal range of motion. Neck supple. No JVD present. No tracheal deviation present. No thyromegaly present.  Cardiovascular: Normal rate, regular rhythm, normal heart sounds and intact distal pulses.  Exam reveals no gallop and no friction rub.   No murmur heard. Pulmonary/Chest: Effort normal and breath sounds normal. No stridor. No respiratory distress. He has no wheezes. He has no rales. He exhibits no tenderness.  Abdominal: Soft. Bowel sounds are normal. There is no tenderness.  Musculoskeletal: Normal range of motion.  Lymphadenopathy:    He has no cervical adenopathy.  Neurological: He is alert and oriented to person, place, and time. He has normal reflexes. Coordination normal.  Skin: Skin is warm and dry. No rash noted. He is not diaphoretic. No erythema. No pallor.  Psychiatric: He has a normal mood and affect. His behavior is normal. Judgment and thought content normal.   Filed Vitals:   04/17/14 1019  BP: 124/88  Pulse: 95  Temp: 98.9 F (37.2 C)  Resp: 16   Lab Results  Component Value Date   WBC 10.1 06/23/2013   HGB 17.5* 06/23/2013   HCT 50.3 06/23/2013   PLT 157 06/23/2013   GLUCOSE 105* 06/23/2013   ALT 11 06/23/2013   AST 23 06/23/2013   NA 139 06/23/2013   K 3.4* 06/23/2013   CL 100 06/23/2013   CREATININE 1.12 06/23/2013   BUN 10 06/23/2013  CO2 27 06/23/2013      Assessment & Plan:  Francee PiccoloDerek was seen today for asthma.  Diagnoses and associated orders for this visit:  Asthma with acute exacerbation - albuterol (PROVENTIL HFA;VENTOLIN HFA) 108 (90 BASE) MCG/ACT inhaler; Inhale 2 puffs into the lungs every 6 (six) hours as needed for wheezing. - beclomethasone (QVAR) 80 MCG/ACT inhaler; Inhale 1 puff into the lungs 2 (two) times daily.  Environmental and seasonal  allergies - albuterol (PROVENTIL HFA;VENTOLIN HFA) 108 (90 BASE) MCG/ACT inhaler; Inhale 2 puffs into the lungs every 6 (six) hours as needed for wheezing.    Patient Instructions  You should have refills on your albuterol inhaler at the pharmacy, 2 puffs by mouth every 6 hours as needed for rescue symptoms.  Qvar 80 mcg inhaler one inhalation twice a day for symptom control.  Force NON dairy fluids .    OTC Flonase OR Nasacort AQ 1 spray in each nostril twice a day as needed. Use the "crossover" technique into opposite nostril spraying toward opposite ear @ 45 degree angle, not straight up into nostril.   Plain OTC Allegra (NOT D )  160 daily , OTC Loratidine 10 mg , OR OTC Zyrtec 10 mg @ bedtime  as needed for itchy eyes & sneezing.  Followup is symptoms worsen or do not improve despite treatment.

## 2014-04-17 NOTE — Telephone Encounter (Signed)
Patient Information:  Caller Name: Francee PiccoloDerek  Phone: [phone]  Patient: Austin Curry, Austin Curry  Gender: Male  DOB: 07/04/1981  Age: 33 Years  PCP: Eleonore ChiquitoKwiatkowski, Peter (Family Practice > 2256yrs old)  Office Follow Up:  Does the office need to follow up with this patient?: No  Instructions For The Office: N/A  RN Note:  ER CALL. Asthma Flare up w/ Cough, SOB, Chest tightness, onset 4-22. Pt used last of Proventil HFA on 4-22, needs refill. Afebrile. Pt is currently not wheezing or SOB.  All emergent sxs ruled out per Asthma Attack protocol, see w/n 2 weeks d/t no asthma checkup in over 6mths.  Appt scheduled at 1015 on 4-23 w/ Donell BeersMatthew Tucker, PA, d/t Asthma flare up, out of inhaler w/ refill request and no OV since June 2014, no availablity w/ PCP.  Discussed taking anti-histamine daily.  Pt verbalized understanding.  Symptoms  Reason For Call & Symptoms: ER CALL. Asthma Flare up w/ Cough, SOB, onset 4-22.  Reviewed Health History In EMR: N/A  Reviewed Medications In EMR: N/A  Reviewed Allergies In EMR: N/A  Reviewed Surgeries / Procedures: N/A  Date of Onset of Symptoms: 04/16/2014  Treatments Tried: Proventil  Treatments Tried Worked: No  Guideline(s) Used:  Asthma Attack  Disposition Per Guideline:   See Within 2 Weeks in Office  Reason For Disposition Reached:   No asthma checkup in > 6 months  Advice Given:  N/A  Patient Will Follow Care Advice:  YES  Appointment Scheduled:  04/17/2014 10:15:00 Appointment Scheduled Provider:  Donell Beersucker, Matthew

## 2014-04-17 NOTE — Telephone Encounter (Signed)
Noted  

## 2014-04-18 ENCOUNTER — Telehealth: Payer: Self-pay | Admitting: Internal Medicine

## 2014-04-18 NOTE — Telephone Encounter (Signed)
Relevant patient education assigned to patient using Emmi. ° °

## 2014-06-30 ENCOUNTER — Other Ambulatory Visit (INDEPENDENT_AMBULATORY_CARE_PROVIDER_SITE_OTHER): Payer: Self-pay

## 2014-06-30 DIAGNOSIS — Z0000 Encounter for general adult medical examination without abnormal findings: Secondary | ICD-10-CM

## 2014-06-30 LAB — BASIC METABOLIC PANEL
BUN: 21 mg/dL (ref 6–23)
CALCIUM: 9.6 mg/dL (ref 8.4–10.5)
CO2: 25 mEq/L (ref 19–32)
CREATININE: 1.1 mg/dL (ref 0.4–1.5)
Chloride: 103 mEq/L (ref 96–112)
GFR: 95.45 mL/min (ref >60.00)
Glucose, Bld: 96 mg/dL (ref 70–99)
Potassium: 4.1 mEq/L (ref 3.5–5.1)
Sodium: 138 mEq/L (ref 135–145)

## 2014-06-30 LAB — CBC WITH DIFFERENTIAL/PLATELET
BASOS PCT: 0.7 % (ref 0.0–3.0)
Basophils Absolute: 0 10*3/uL (ref 0.0–0.1)
EOS ABS: 0.1 10*3/uL (ref 0.0–0.7)
EOS PCT: 1.7 % (ref 0.0–5.0)
HCT: 47.4 % (ref 39.0–52.0)
Hemoglobin: 15.7 g/dL (ref 13.0–17.0)
LYMPHS PCT: 38.2 % (ref 12.0–46.0)
Lymphs Abs: 2.1 10*3/uL (ref 0.7–4.0)
MCHC: 33.2 g/dL (ref 30.0–36.0)
MCV: 89.3 fl (ref 78.0–100.0)
Monocytes Absolute: 0.4 10*3/uL (ref 0.1–1.0)
Monocytes Relative: 7.6 % (ref 3.0–12.0)
NEUTROS PCT: 51.8 % (ref 43.0–77.0)
Neutro Abs: 2.9 10*3/uL (ref 1.4–7.7)
Platelets: 169 10*3/uL (ref 150.0–400.0)
RBC: 5.3 Mil/uL (ref 4.22–5.81)
RDW: 13.4 % (ref 11.5–15.5)
WBC: 5.6 10*3/uL (ref 4.0–10.5)

## 2014-06-30 LAB — TSH: TSH: 0.5 u[IU]/mL (ref 0.35–4.50)

## 2014-06-30 LAB — POCT URINALYSIS DIPSTICK
BILIRUBIN UA: NEGATIVE
Blood, UA: NEGATIVE
GLUCOSE UA: NEGATIVE
KETONES UA: NEGATIVE
Leukocytes, UA: NEGATIVE
Nitrite, UA: NEGATIVE
PROTEIN UA: NEGATIVE
SPEC GRAV UA: 1.01
Urobilinogen, UA: 0.2
pH, UA: 6.5

## 2014-06-30 LAB — HEPATIC FUNCTION PANEL
ALK PHOS: 50 U/L (ref 39–117)
ALT: 15 U/L (ref 0–53)
AST: 24 U/L (ref 0–37)
Albumin: 4.2 g/dL (ref 3.5–5.2)
BILIRUBIN DIRECT: 0 mg/dL (ref 0.0–0.3)
TOTAL PROTEIN: 7.4 g/dL (ref 6.0–8.3)
Total Bilirubin: 0.8 mg/dL (ref 0.2–1.2)

## 2014-06-30 LAB — LIPID PANEL
CHOLESTEROL: 189 mg/dL (ref 0–200)
HDL: 59.2 mg/dL (ref >39.00)
LDL CALC: 118 mg/dL — AB (ref 0–99)
NonHDL: 129.8
TRIGLYCERIDES: 58 mg/dL (ref 0.0–149.0)
Total CHOL/HDL Ratio: 3
VLDL: 11.6 mg/dL (ref 0.0–40.0)

## 2014-07-07 ENCOUNTER — Ambulatory Visit (INDEPENDENT_AMBULATORY_CARE_PROVIDER_SITE_OTHER): Payer: Self-pay | Admitting: Internal Medicine

## 2014-07-07 ENCOUNTER — Encounter: Payer: Self-pay | Admitting: Internal Medicine

## 2014-07-07 VITALS — BP 130/80 | HR 90 | Temp 98.8°F | Resp 20 | Ht 73.5 in | Wt 167.0 lb

## 2014-07-07 DIAGNOSIS — Z720 Tobacco use: Secondary | ICD-10-CM

## 2014-07-07 DIAGNOSIS — F17200 Nicotine dependence, unspecified, uncomplicated: Secondary | ICD-10-CM

## 2014-07-07 DIAGNOSIS — Z0000 Encounter for general adult medical examination without abnormal findings: Secondary | ICD-10-CM

## 2014-07-07 DIAGNOSIS — J45909 Unspecified asthma, uncomplicated: Secondary | ICD-10-CM

## 2014-07-07 NOTE — Progress Notes (Signed)
Pre visit review using our clinic review tool, if applicable. No additional management support is needed unless otherwise documented below in the visit note. 

## 2014-07-07 NOTE — Patient Instructions (Signed)

## 2014-07-07 NOTE — Progress Notes (Signed)
   Subjective:    Patient ID: Austin Curry, male    DOB: 09/04/1981, 33 y.o.   MRN: [id number]  HPI  Wt Readings from Last 3 Encounters:  07/07/14 167 lb (75.751 kg)  04/17/14 154 lb (69.854 kg)  06/25/13 162 lb (73.483 kg)   Subjective:    Patient ID: Austin Kirschnererek J Stober, male    DOB: 09/04/1981, 33 y.o.   MRN: [id number]  HPI   33 year old patient who is seen today for a health examination. Past medical history is remarkable for a history of asthma. He presently uses when necessary albuterol and has only mild seasonal asthma. Past medical  history otherwise unremarkable. He smokes a rare cigarette only  [place]  Graduated [employer]. College  Degree  Family history father in good health Mother history of diabetes and coronary artery disease Paternal grandmother with colon cancer    Review of Systems  Constitutional: Negative for fever, chills, appetite change and fatigue.  HENT: Negative for hearing loss, ear pain, congestion, sore throat, trouble swallowing, neck stiffness, dental problem, voice change and tinnitus.   Eyes: Negative for pain, discharge and visual disturbance.  Respiratory: Negative for cough, chest tightness, wheezing and stridor.   Cardiovascular: Negative for chest pain, palpitations and leg swelling.  Gastrointestinal: Negative for nausea, vomiting, abdominal pain, diarrhea, constipation, blood in stool and abdominal distention.  Genitourinary: Negative for urgency, hematuria, flank pain, discharge, difficulty urinating and genital sores.  Musculoskeletal: Negative for myalgias, back pain, joint swelling, arthralgias and gait problem.  Skin: Negative for rash.  Neurological: Negative for dizziness, syncope, speech difficulty, weakness, numbness and headaches.  Hematological: Negative for adenopathy. Does not bruise/bleed easily.  Psychiatric/Behavioral: Negative for behavioral problems and  dysphoric mood. The patient is not nervous/anxious.        Objective:   Physical Exam  Constitutional: He appears well-developed and well-nourished.  HENT:  Head: Normocephalic and atraumatic.  Right Ear: External ear normal.  Left Ear: External ear normal.  Nose: Nose normal.  Mouth/Throat: Oropharynx is clear and moist.  Eyes: Conjunctivae and EOM are normal. Pupils are equal, round, and reactive to light. No scleral icterus.  Neck: Normal range of motion. Neck supple. No JVD present. No thyromegaly present.  Cardiovascular: Regular rhythm, normal heart sounds and intact distal pulses.  Exam reveals no gallop and no friction rub.   No murmur heard. Pulmonary/Chest: Effort normal and breath sounds normal. He exhibits no tenderness.  Abdominal: Soft. Bowel sounds are normal. He exhibits no distension and no mass. There is no tenderness.  Small easily reducible ventral hernia in the epigastric area  Genitourinary:  Musculoskeletal: Normal range of motion. He exhibits no edema and no tenderness.  Lymphadenopathy:    He has no cervical adenopathy.  Neurological: He is alert. He has normal reflexes. No cranial nerve deficit. Coordination normal.  Skin: Skin is warm and dry. No rash noted.  Psychiatric: He has a normal mood and affect. His behavior is normal.          Assessment & Plan:   Preventive health exam Asthma mild stable Small ventral hernia  Albuterol refilled Return in one year or as needed Smoking cessation encouraged   Review of Systems    as above Objective:   Physical Exam   As above     Assessment & Plan:  Preventive health examination

## 2014-07-08 ENCOUNTER — Telehealth: Payer: Self-pay | Admitting: Internal Medicine

## 2014-07-08 NOTE — Telephone Encounter (Signed)
Relevant patient education mailed to patient.  

## 2014-08-06 ENCOUNTER — Telehealth: Payer: Self-pay | Admitting: Internal Medicine

## 2014-08-06 DIAGNOSIS — Z114 Encounter for screening for human immunodeficiency virus [HIV]: Secondary | ICD-10-CM

## 2014-08-06 NOTE — Telephone Encounter (Signed)
Pt is requesting an order for labs to be tested for HIV.

## 2014-08-07 NOTE — Telephone Encounter (Signed)
Left message on voicemail to call office.  

## 2014-08-11 NOTE — Telephone Encounter (Signed)
Error/gd °

## 2014-08-12 NOTE — Telephone Encounter (Signed)
Left message for pt to call back and schedule appt °

## 2014-08-12 NOTE — Telephone Encounter (Signed)
Left message on voicemail to call office. [facility].

## 2014-08-15 NOTE — Telephone Encounter (Signed)
Left message to call back  

## 2015-02-24 ENCOUNTER — Emergency Department (HOSPITAL_COMMUNITY)
Admission: EM | Admit: 2015-02-24 | Discharge: 2015-02-24 | Disposition: A | Discharge status: Home / Self Care | Payer: 59 | Attending: Emergency Medicine | Admitting: Emergency Medicine

## 2015-02-24 ENCOUNTER — Encounter (HOSPITAL_COMMUNITY): Payer: Self-pay | Admitting: Emergency Medicine

## 2015-02-24 ENCOUNTER — Emergency Department (HOSPITAL_COMMUNITY): Payer: 59

## 2015-02-24 DIAGNOSIS — Y998 Other external cause status: Secondary | ICD-10-CM | POA: Insufficient documentation

## 2015-02-24 DIAGNOSIS — Y92003 Bedroom of unspecified non-institutional (private) residence as the place of occurrence of the external cause: Secondary | ICD-10-CM | POA: Diagnosis not present

## 2015-02-24 DIAGNOSIS — Z7951 Long term (current) use of inhaled steroids: Secondary | ICD-10-CM | POA: Diagnosis not present

## 2015-02-24 DIAGNOSIS — Z8719 Personal history of other diseases of the digestive system: Secondary | ICD-10-CM | POA: Diagnosis not present

## 2015-02-24 DIAGNOSIS — J45909 Unspecified asthma, uncomplicated: Secondary | ICD-10-CM | POA: Insufficient documentation

## 2015-02-24 DIAGNOSIS — S8991XA Unspecified injury of right lower leg, initial encounter: Secondary | ICD-10-CM | POA: Diagnosis present

## 2015-02-24 DIAGNOSIS — X58XXXA Exposure to other specified factors, initial encounter: Secondary | ICD-10-CM | POA: Diagnosis not present

## 2015-02-24 DIAGNOSIS — Z79899 Other long term (current) drug therapy: Secondary | ICD-10-CM | POA: Insufficient documentation

## 2015-02-24 DIAGNOSIS — Z87891 Personal history of nicotine dependence: Secondary | ICD-10-CM | POA: Diagnosis not present

## 2015-02-24 DIAGNOSIS — Y9389 Activity, other specified: Secondary | ICD-10-CM | POA: Diagnosis not present

## 2015-02-24 DIAGNOSIS — M25561 Pain in right knee: Secondary | ICD-10-CM

## 2015-02-24 MED ORDER — HYDROCODONE-ACETAMINOPHEN 5-325 MG PO TABS: 2.0000 | ORAL_TABLET | ORAL | Status: DC | PRN

## 2015-02-24 MED ORDER — NAPROXEN 500 MG PO TABS: 500.0000 mg | ORAL_TABLET | Freq: Two times a day (BID) | ORAL | Status: DC

## 2015-02-24 NOTE — ED Notes (Signed)
Pt states he is having pain in his right knee  Pt states last night he felt a pop and it has become more uncomfortable making it hard for him to sleep

## 2015-02-24 NOTE — Discharge Instructions (Signed)
It is important to follow-up with orthopedics for definitive care of your right knee. Your x-rays today did not show any evidence of fracture or dislocation, but does suggest you may have a ruptured patellar tendon. Please take your pain medicines as directed. Return to ED for worsening symptoms.

## 2015-02-24 NOTE — ED Notes (Signed)
Applied knee imboilzer to right knee. Pt tolerated it well.

## 2015-02-24 NOTE — ED Provider Notes (Signed)
CSN: [id number]     Arrival date & time 02/24/15  0505 History   First MD Initiated Contact with Patient 02/24/15 0630     Chief Complaint  Patient presents with  . Knee Pain     (Consider location/radiation/quality/duration/timing/severity/associated sxs/prior Treatment) HPI Austin Curry is a 34 y.o. male who comes in for evaluation of right knee pain. Patient states this morning at approximately 4:00 he was rolling over in bed when he experienced a sharp pain and a pop in his right knee. He reports being able to bear weight, but it is uncomfortable. He reports decreased range of motion due to discomfort. He has not tried anything to improve his symptoms, palpation and certain movements exacerbate his symptoms. He rates his discomfort as 7/10.  Past Medical History  Diagnosis Date  . Asthma   . Hernia    History reviewed. No pertinent past surgical history. Family History  Problem Relation Age of Onset  . Diabetes Mother   . Hypertension Mother   . Hyperlipidemia Father    History  Substance Use Topics  . Smoking status: Former Smoker -- 0.25 packs/day    Types: Cigarettes    Quit date: 12/09/2014  . Smokeless tobacco: Never Used  . Alcohol Use: Yes     Comment: 1 drink every other month or so, socially    Review of Systems  Constitutional: Negative for fever.  Respiratory: Negative for shortness of breath.   Cardiovascular: Negative for chest pain.  Musculoskeletal: Positive for arthralgias.  Skin: Negative for color change and wound.  Neurological: Negative for weakness and numbness.      Allergies  Review of patient's allergies indicates no known allergies.  Home Medications   Prior to Admission medications   Medication Sig Start Date End Date Taking? Authorizing Provider  albuterol (PROVENTIL HFA;VENTOLIN HFA) 108 (90 BASE) MCG/ACT inhaler Inhale 2 puffs into the lungs every 6 (six) hours as needed for wheezing. 04/17/14  Yes Toniann Ket, PA-C   beclomethasone (QVAR) 80 MCG/ACT inhaler Inhale 1 puff into the lungs 2 (two) times daily. 04/17/14  Yes Toniann Ket, PA-C  HYDROcodone-acetaminophen (NORCO/VICODIN) 5-325 MG per tablet Take 2 tablets by mouth every 4 (four) hours as needed. 02/24/15   Earle Gell Beckey Polkowski, PA-C  naproxen (NAPROSYN) 500 MG tablet Take 1 tablet (500 mg total) by mouth 2 (two) times daily. 02/24/15   Earle Gell Aneira Cavitt, PA-C   BP 138/69 mmHg  Pulse 81  Temp(Src) 97.6 F (36.4 C) (Oral)  Resp 16  Ht  (1.905 m)  Wt 170 lb (77.111 kg)  BMI 21.25 kg/m2  SpO2 98% Physical Exam  Constitutional:  Awake, alert, nontoxic appearance.  HENT:  Head: Atraumatic.  Eyes: Right eye exhibits no discharge. Left eye exhibits no discharge.  Neck: Neck supple.  Pulmonary/Chest: Effort normal. He exhibits no tenderness.  Abdominal: Soft. There is no tenderness. There is no rebound.  Musculoskeletal: He exhibits no tenderness.  Mild tenderness to medial aspect of the patellar tendon on right knee. No ligamentous laxity noted. No tenderness along the joint lines. No obvious effusion evident. No overt warmth, erythema or edema. Decreased range of motion due to discomfort. Distal pulses intact.  Neurological:  Mental status and motor strength appears baseline for patient and situation.  Skin: No rash noted.  Psychiatric: He has a normal mood and affect.  Nursing note and vitals reviewed.   ED Course  Procedures (including critical care time) Labs Review Labs Reviewed - No  data to display  Imaging Review Dg Knee Complete 4 Views Right  02/24/2015   CLINICAL DATA:  Medial right knee pain after pop. Initial encounter.  EXAM: RIGHT KNEE - COMPLETE 4+ VIEW  COMPARISON:  None.  FINDINGS: Persistent patella alta with mild fat haziness in Hoffa's fat pad. There is no joint effusion or fracture. No degenerative change.  IMPRESSION: 1. Patella alta, as seen with patellar tendon injury. Correlate with extensor exam. 2. No acute  fracture.   Electronically Signed   By: Marnee SpringJonathon  Watts M.D.   On: 02/24/2015 06:53     EKG Interpretation None     Meds given in ED:  Medications - No data to display  New Prescriptions   HYDROCODONE-ACETAMINOPHEN (NORCO/VICODIN) 5-325 MG PER TABLET    Take 2 tablets by mouth every 4 (four) hours as needed.   NAPROXEN (NAPROSYN) 500 MG TABLET    Take 1 tablet (500 mg total) by mouth 2 (two) times daily.   Filed Vitals:   02/24/15 0517  BP: 138/69  Pulse: 81  Temp: 97.6 F (36.4 C)  TempSrc: Oral  Resp: 16  Height: 6\' 3"  (1.905 m)  Weight: 170 lb (77.111 kg)  SpO2: 98%    MDM  Vitals stable - WNL -afebrile Pt resting comfortably in ED. PE--no evidence of hemarthrosis, septic joint. Distal pulses and neurovascularly intact. Imaging--x-ray right knee shows patella alta, no evidence of other acute fracture or dislocation.  Will DC with pain medicines, knee immobilizer and referral to orthopedics. I discussed all relevant lab findings and imaging results with pt and they verbalized understanding. Discussed f/u with PCP within 48 hrs and return precautions, pt very amenable to plan.  Prior to patient discharge, I discussed and reviewed this case with Dr.Oni     Final diagnoses:  Knee pain, acute, right        Sharlene MottsBenjamin W Siana Panameno, PA-C 02/24/15 [id number]  Tomasita CrumbleAdeleke Oni, MD 02/24/15 [phone]

## 2015-02-27 ENCOUNTER — Ambulatory Visit: Payer: 59 | Admitting: Internal Medicine

## 2015-04-21 ENCOUNTER — Emergency Department (HOSPITAL_COMMUNITY): Admission: EM | Admit: 2015-04-21 | Discharge: 2015-04-21 | Discharge status: Absent without leave | Payer: 59

## 2015-07-02 ENCOUNTER — Other Ambulatory Visit (INDEPENDENT_AMBULATORY_CARE_PROVIDER_SITE_OTHER): Payer: 59

## 2015-07-02 DIAGNOSIS — Z0000 Encounter for general adult medical examination without abnormal findings: Secondary | ICD-10-CM | POA: Diagnosis not present

## 2015-07-02 LAB — LIPID PANEL
Cholesterol: 173 mg/dL (ref 0–200)
HDL: 46 mg/dL (ref >39.00)
LDL Cholesterol: 102 mg/dL — ABNORMAL HIGH (ref 0–99)
NonHDL: 127
Total CHOL/HDL Ratio: 4
Triglycerides: 125 mg/dL (ref 0.0–149.0)
VLDL: 25 mg/dL (ref 0.0–40.0)

## 2015-07-02 LAB — POCT URINALYSIS DIPSTICK
Bilirubin, UA: NEGATIVE
Blood, UA: NEGATIVE
GLUCOSE UA: NEGATIVE
Ketones, UA: NEGATIVE
Leukocytes, UA: NEGATIVE
Nitrite, UA: NEGATIVE
Protein, UA: NEGATIVE
Spec Grav, UA: 1.025
UROBILINOGEN UA: 1
pH, UA: 5.5

## 2015-07-02 LAB — CBC WITH DIFFERENTIAL/PLATELET
Basophils Absolute: 0 10*3/uL (ref 0.0–0.1)
Basophils Relative: 0.6 % (ref 0.0–3.0)
EOS PCT: 1.7 % (ref 0.0–5.0)
Eosinophils Absolute: 0.1 10*3/uL (ref 0.0–0.7)
HCT: 46.4 % (ref 39.0–52.0)
Hemoglobin: 15.5 g/dL (ref 13.0–17.0)
LYMPHS PCT: 31.3 % (ref 12.0–46.0)
Lymphs Abs: 1.6 10*3/uL (ref 0.7–4.0)
MCHC: 33.4 g/dL (ref 30.0–36.0)
MCV: 88.8 fl (ref 78.0–100.0)
MONOS PCT: 4.8 % (ref 3.0–12.0)
Monocytes Absolute: 0.2 10*3/uL (ref 0.1–1.0)
NEUTROS PCT: 61.6 % (ref 43.0–77.0)
Neutro Abs: 3.1 10*3/uL (ref 1.4–7.7)
Platelets: 161 10*3/uL (ref 150.0–400.0)
RBC: 5.22 Mil/uL (ref 4.22–5.81)
RDW: 13.9 % (ref 11.5–15.5)
WBC: 5 10*3/uL (ref 4.0–10.5)

## 2015-07-02 LAB — HEPATIC FUNCTION PANEL
ALT: 11 U/L (ref 0–53)
AST: 17 U/L (ref 0–37)
Albumin: 4 g/dL (ref 3.5–5.2)
Alkaline Phosphatase: 41 U/L (ref 39–117)
BILIRUBIN DIRECT: 0.1 mg/dL (ref 0.0–0.3)
TOTAL PROTEIN: 6.7 g/dL (ref 6.0–8.3)
Total Bilirubin: 0.4 mg/dL (ref 0.2–1.2)

## 2015-07-02 LAB — BASIC METABOLIC PANEL
BUN: 13 mg/dL (ref 6–23)
CO2: 30 mEq/L (ref 19–32)
Calcium: 9.8 mg/dL (ref 8.4–10.5)
Chloride: 102 mEq/L (ref 96–112)
Creatinine, Ser: 1.04 mg/dL (ref 0.40–1.50)
GFR: 105.46 mL/min (ref >60.00)
GLUCOSE: 88 mg/dL (ref 70–99)
Potassium: 5 mEq/L (ref 3.5–5.1)
SODIUM: 140 meq/L (ref 135–145)

## 2015-07-02 LAB — TSH: TSH: 0.42 u[IU]/mL (ref 0.35–4.50)

## 2015-07-09 ENCOUNTER — Encounter: Payer: Self-pay | Admitting: Internal Medicine

## 2015-07-09 ENCOUNTER — Ambulatory Visit (INDEPENDENT_AMBULATORY_CARE_PROVIDER_SITE_OTHER): Payer: 59 | Admitting: Internal Medicine

## 2015-07-09 VITALS — BP 120/70 | HR 70 | Temp 98.6°F | Resp 18 | Ht 73.0 in | Wt 164.0 lb

## 2015-07-09 DIAGNOSIS — Z0000 Encounter for general adult medical examination without abnormal findings: Secondary | ICD-10-CM | POA: Diagnosis not present

## 2015-07-09 DIAGNOSIS — J4520 Mild intermittent asthma, uncomplicated: Secondary | ICD-10-CM

## 2015-07-09 DIAGNOSIS — J3089 Other allergic rhinitis: Secondary | ICD-10-CM

## 2015-07-09 MED ORDER — ALBUTEROL SULFATE HFA 108 (90 BASE) MCG/ACT IN AERS: 2.0000 | INHALATION_SPRAY | Freq: Four times a day (QID) | RESPIRATORY_TRACT | Status: DC | PRN

## 2015-07-09 MED ORDER — BECLOMETHASONE DIPROPIONATE 80 MCG/ACT IN AERS: 1.0000 | INHALATION_SPRAY | Freq: Two times a day (BID) | RESPIRATORY_TRACT | Status: DC

## 2015-07-09 NOTE — Patient Instructions (Signed)

## 2015-07-09 NOTE — Progress Notes (Signed)
Pre visit review using our clinic review tool, if applicable. No additional management support is needed unless otherwise documented below in the visit note. 

## 2015-07-09 NOTE — Progress Notes (Signed)
Subjective:    Patient ID: Elmon Kirschnererek J Jaber, male    DOB: [birth year], 34 y.o.   MRN: [id number]  HPI   Wt Readings from Last 3 Encounters:  07/09/15 164 lb (74.39 kg)  02/24/15 170 lb (77.111 kg)  07/07/14 167 lb (75.751 kg)   Subjective:    Patient ID: Elmon Kirschnererek J Boldon, male    DOB: [birth year], 34 y.o.   MRN: [id number]  HPI   34 -year-old patient who is seen today for a health examination. Past medical history is remarkable for a history of asthma. He presently uses when necessary albuterol and has only mild seasonal asthma. Past medical  history otherwise unremarkable. He smokes a rare cigarette only  [place]  Graduated [employer]. College  Degree; Graduated  [employer]  Family history father in good health Mother history of diabetes and coronary artery disease Paternal grandmother with colon cancer    Review of Systems  Constitutional: Negative for fever, chills, appetite change and fatigue.  HENT: Negative for hearing loss, ear pain, congestion, sore throat, trouble swallowing, neck stiffness, dental problem, voice change and tinnitus.   Eyes: Negative for pain, discharge and visual disturbance.  Respiratory: Negative for cough, chest tightness, wheezing and stridor.   Cardiovascular: Negative for chest pain, palpitations and leg swelling.  Gastrointestinal: Negative for nausea, vomiting, abdominal pain, diarrhea, constipation, blood in stool and abdominal distention.  Genitourinary: Negative for urgency, hematuria, flank pain, discharge, difficulty urinating and genital sores.  Musculoskeletal: Negative for myalgias, back pain, joint swelling, arthralgias and gait problem.  Skin: Negative for rash.  Neurological: Negative for dizziness, syncope, speech difficulty, weakness, numbness and headaches.  Hematological: Negative for adenopathy. Does not bruise/bleed easily.  Psychiatric/Behavioral: Negative for  behavioral problems and dysphoric mood. The patient is not nervous/anxious.        Objective:   Physical Exam  Constitutional: He appears well-developed and well-nourished.  HENT:  Head: Normocephalic and atraumatic.  Right Ear: External ear normal.  Left Ear: External ear normal.  Nose: Nose normal.  Mouth/Throat: Oropharynx is clear and moist.  Eyes: Conjunctivae and EOM are normal. Pupils are equal, round, and reactive to light. No scleral icterus.  Neck: Normal range of motion. Neck supple. No JVD present. No thyromegaly present.  Cardiovascular: Regular rhythm, normal heart sounds and intact distal pulses.  Exam reveals no gallop and no friction rub.   No murmur heard. Pulmonary/Chest: Effort normal and breath sounds normal. He exhibits no tenderness.  Abdominal: Soft. Bowel sounds are normal. He exhibits no distension and no mass. There is no tenderness.  Small easily reducible ventral hernia in the epigastric area  Genitourinary:  Musculoskeletal: Normal range of motion. He exhibits no edema and no tenderness.  Lymphadenopathy:    He has no cervical adenopathy.  Neurological: He is alert. He has normal reflexes. No cranial nerve deficit. Coordination normal.  Skin: Skin is warm and dry. No rash noted.  Psychiatric: He has a normal mood and affect. His behavior is normal.          Assessment & Plan:   Preventive health exam Asthma mild stable Small ventral hernia  Albuterol refilled Return in one year or as needed Smoking cessation encouraged   Review of Systems     as above Objective:   Physical Exam    As above     Assessment & Plan:  Preventive health examination Total smoking cessation encouraged Mild asthma, stable.  Continue when necessary albuterol  Recheck one year or as needed

## 2015-08-21 ENCOUNTER — Emergency Department (INDEPENDENT_AMBULATORY_CARE_PROVIDER_SITE_OTHER)
Admission: EM | Admit: 2015-08-21 | Discharge: 2015-08-21 | Disposition: A | Discharge status: Home / Self Care | Payer: 59 | Source: Home / Self Care | Attending: Family Medicine | Admitting: Family Medicine

## 2015-08-21 ENCOUNTER — Encounter (HOSPITAL_COMMUNITY): Payer: Self-pay | Admitting: Emergency Medicine

## 2015-08-21 DIAGNOSIS — M79672 Pain in left foot: Secondary | ICD-10-CM

## 2015-08-21 DIAGNOSIS — M722 Plantar fascial fibromatosis: Secondary | ICD-10-CM | POA: Diagnosis not present

## 2015-08-21 MED ORDER — DICLOFENAC SODIUM 1 % TD GEL: 2.0000 g | Freq: Four times a day (QID) | TRANSDERMAL | Status: DC

## 2015-08-21 MED ORDER — NAPROXEN 500 MG PO TABS: 500.0000 mg | ORAL_TABLET | Freq: Two times a day (BID) | ORAL | Status: DC

## 2015-08-21 NOTE — ED Notes (Signed)
C/o left heel pain onset 1 month... Pain increases w/activity Denies inj/trauma Steady gait Alert... No acute distress.

## 2015-08-21 NOTE — ED Provider Notes (Signed)
CSN: [id number]     Arrival date & time 08/21/15  1731 History   First MD Initiated Contact with Patient 08/21/15 1914     Chief Complaint  Patient presents with  . Foot Pain   (Consider location/radiation/quality/duration/timing/severity/associated sxs/prior Treatment) HPI Comments: Patient presents with a painful left heel. No known injury. Very painful in the mornings when stepping out of bed. 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 No achilles pain. No swelling is noted.   Patient is a 34 y.o. male presenting with lower extremity pain. The history is provided by the patient.  Foot Pain    Past Medical History  Diagnosis Date  . Asthma   . Hernia    History reviewed. No pertinent past surgical history. Family History  Problem Relation Age of Onset  . Diabetes Mother   . Hypertension Mother   . Hyperlipidemia Father    Social History  Substance Use Topics  . Smoking status: Former Smoker -- 0.25 packs/day    Types: Cigarettes    Quit date: 12/09/2014  . Smokeless tobacco: Never Used  . Alcohol Use: Yes     Comment: 1 drink every other month or so, socially    Review of Systems  All other systems reviewed and are negative.   Allergies  Review of patient's allergies indicates no known allergies.  Home Medications   Prior to Admission medications   Medication Sig Start Date End Date Taking? Authorizing Provider  albuterol (PROVENTIL HFA;VENTOLIN HFA) 108 (90 BASE) MCG/ACT inhaler Inhale 2 puffs into the lungs every 6 (six) hours as needed for wheezing. 07/09/15   Gordy Savers, MD  beclomethasone (QVAR) 80 MCG/ACT inhaler Inhale 1 puff into the lungs 2 (two) times daily. 07/09/15   Gordy Savers, MD  diclofenac sodium (VOLTAREN) 1 % GEL Apply 2 g topically 4 (four) times daily. 08/21/15   Riki Sheer, PA-C  naproxen (NAPROSYN) 500 MG tablet Take 1 tablet (500 mg total) by mouth 2 (two) times daily. 08/21/15   Riki Sheer, PA-C   Meds Ordered  and Administered this Visit  Medications - No data to display  BP 134/87 mmHg  Pulse 60  Temp(Src) 98.2 F (36.8 C) (Oral)  Resp 16  SpO2 96% No data found.   Physical Exam  Constitutional: He is oriented to person, place, and time. He appears well-developed and well-nourished. No distress.  Musculoskeletal:  Tenderness to the left heel with palpation. No warmth or erythema. No welling. Pain with flexion of the foot is reproduced.   Neurological: He is alert and oriented to person, place, and time.  Skin: Skin is warm and dry. He is not diaphoretic.  Psychiatric: His behavior is normal.  Nursing note and vitals reviewed.   ED Course  Procedures (including critical care time)  Labs Review Labs Reviewed - No data to display  Imaging Review No results found.   Visual Acuity Review  Right Eye Distance:   Left Eye Distance:   Bilateral Distance:    Right Eye Near:   Left Eye Near:    Bilateral Near:         MDM   1. Plantar fasciitis of left foot   2. Heel pain, left    Treat with NSAIDs both oral and topical. Rest, ice, stretches and heel cups. Work note given for rest. F/U with ortho if not improved. No emergent concerns.    Riki Sheer, PA-C 08/21/15 1941

## 2015-08-21 NOTE — Discharge Instructions (Signed)
Plantar Fasciitis Plantar fasciitis is a common condition that causes foot pain. It is soreness (inflammation) of the band of tough fibrous tissue on the bottom of the foot that runs from the heel bone (calcaneus) to the ball of the foot. The cause of this soreness may be from excessive standing, poor fitting shoes, running on hard surfaces, being overweight, having an abnormal walk, or overuse (this is common in runners) of the painful foot or feet. It is also common in aerobic exercise dancers and ballet dancers. SYMPTOMS  Most people with plantar fasciitis complain of:  Severe pain in the morning on the bottom of their foot especially when taking the first steps out of bed. This pain recedes after a few minutes of walking.  Severe pain is experienced also during walking following a long period of inactivity.  Pain is worse when walking barefoot or up stairs DIAGNOSIS   Your caregiver will diagnose this condition by examining and feeling your foot.  Special tests such as X-rays of your foot, are usually not needed. PREVENTION   Consult a sports medicine professional before beginning a new exercise program.  Walking programs offer a good workout. With walking there is a lower chance of overuse injuries common to runners. There is less impact and less jarring of the joints.  Begin all new exercise programs slowly. If problems or pain develop, decrease the amount of time or distance until you are at a comfortable level.  Wear good shoes and replace them regularly.  Stretch your foot and the heel cords at the back of the ankle (Achilles tendon) both before and after exercise.  Run or exercise on even surfaces that are not hard. For example, asphalt is better than pavement.  Do not run barefoot on hard surfaces.  If using a treadmill, vary the incline.  Do not continue to workout if you have foot or joint problems. Seek professional help if they do not improve. HOME CARE INSTRUCTIONS     Avoid activities that cause you pain until you recover.  Use ice or cold packs on the problem or painful areas after working out.  Only take over-the-counter or prescription medicines for pain, discomfort, or fever as directed by your caregiver.  Soft shoe inserts or athletic shoes with air or gel sole cushions may be helpful.  If problems continue or become more severe, consult a sports medicine caregiver or your own health care provider. Cortisone is a potent anti-inflammatory medication that may be injected into the painful area. You can discuss this treatment with your caregiver. MAKE SURE YOU:   Understand these instructions.  Will watch your condition.  Will get help right away if you are not doing well or get worse. Document Released: 09/06/2001 Document Revised: 03/05/2012 Document Reviewed: 11/05/2008 [facility] Patient Information 2015 [place], [place]. This information is not intended to replace advice given to you by your health care provider. Make sure you discuss any questions you have with your health care provider.    Treat with oral medications and topical medications for 2 weeks. Gel cups to heels. Stretches as we discussed and icing with frozen water bottle.

## 2015-09-02 ENCOUNTER — Encounter (HOSPITAL_COMMUNITY): Payer: Self-pay | Admitting: *Deleted

## 2015-09-02 ENCOUNTER — Emergency Department (HOSPITAL_COMMUNITY): Payer: 59

## 2015-09-02 ENCOUNTER — Emergency Department (HOSPITAL_COMMUNITY)
Admission: EM | Admit: 2015-09-02 | Discharge: 2015-09-02 | Disposition: A | Discharge status: Home / Self Care | Payer: 59 | Attending: Emergency Medicine | Admitting: Emergency Medicine

## 2015-09-02 DIAGNOSIS — Z87891 Personal history of nicotine dependence: Secondary | ICD-10-CM | POA: Diagnosis not present

## 2015-09-02 DIAGNOSIS — R079 Chest pain, unspecified: Secondary | ICD-10-CM | POA: Diagnosis not present

## 2015-09-02 DIAGNOSIS — Z7951 Long term (current) use of inhaled steroids: Secondary | ICD-10-CM | POA: Insufficient documentation

## 2015-09-02 DIAGNOSIS — Z79899 Other long term (current) drug therapy: Secondary | ICD-10-CM | POA: Diagnosis not present

## 2015-09-02 DIAGNOSIS — Z8719 Personal history of other diseases of the digestive system: Secondary | ICD-10-CM | POA: Insufficient documentation

## 2015-09-02 DIAGNOSIS — J45909 Unspecified asthma, uncomplicated: Secondary | ICD-10-CM | POA: Diagnosis not present

## 2015-09-02 LAB — BASIC METABOLIC PANEL
ANION GAP: 9 (ref 5–15)
BUN: 16 mg/dL (ref 6–20)
CALCIUM: 10 mg/dL (ref 8.9–10.3)
CO2: 27 mmol/L (ref 22–32)
CREATININE: 1.2 mg/dL (ref 0.61–1.24)
Chloride: 102 mmol/L (ref 101–111)
Glucose, Bld: 109 mg/dL — ABNORMAL HIGH (ref 65–99)
Potassium: 3.7 mmol/L (ref 3.5–5.1)
SODIUM: 138 mmol/L (ref 135–145)

## 2015-09-02 LAB — I-STAT CHEM 8, ED
BUN: 19 mg/dL (ref 6–20)
CALCIUM ION: 1.18 mmol/L (ref 1.12–1.23)
CREATININE: 1.2 mg/dL (ref 0.61–1.24)
Chloride: 103 mmol/L (ref 101–111)
GLUCOSE: 104 mg/dL — AB (ref 65–99)
HEMATOCRIT: 49 % (ref 39.0–52.0)
HEMOGLOBIN: 16.7 g/dL (ref 13.0–17.0)
Potassium: 3.7 mmol/L (ref 3.5–5.1)
Sodium: 141 mmol/L (ref 135–145)
TCO2: 24 mmol/L (ref 0–100)

## 2015-09-02 LAB — CBC
HCT: 46.1 % (ref 39.0–52.0)
HEMOGLOBIN: 15.5 g/dL (ref 13.0–17.0)
MCH: 29.6 pg (ref 26.0–34.0)
MCHC: 33.6 g/dL (ref 30.0–36.0)
MCV: 88 fL (ref 78.0–100.0)
PLATELETS: 184 10*3/uL (ref 150–400)
RBC: 5.24 MIL/uL (ref 4.22–5.81)
RDW: 13.1 % (ref 11.5–15.5)
WBC: 6.4 10*3/uL (ref 4.0–10.5)

## 2015-09-02 LAB — I-STAT TROPONIN, ED: Troponin i, poc: 0 ng/mL (ref 0.00–0.08)

## 2015-09-02 NOTE — ED Provider Notes (Addendum)
CSN: [id number]     Arrival date & time 09/02/15  1618 History   First MD Initiated Contact with Patient 09/02/15 1816     Chief Complaint  Patient presents with  . Chest Pain     (Consider location/radiation/quality/duration/timing/severity/associated sxs/prior Treatment) Patient is a 34 y.o. male presenting with chest pain. The history is provided by the patient.  Chest Pain Associated symptoms: no abdominal pain, no back pain, no headache, no nausea, no numbness, no shortness of breath, not vomiting and no weakness    patient has had chest pain. His his left mid chest. Worse with moving. No cough. No shortness of breath. No trauma. Not worse with exertion. Worse with certain positions and moving his arms. He is a former smoker. No early cardiac disease in his first-degree relatives. No fevers or chills. No swelling in his legs. Has had it on his left chest for last couple days. He's had a decreased appetite but states he's not been able eat much due to his work schedule. Past Medical History  Diagnosis Date  . Asthma   . Hernia    History reviewed. No pertinent past surgical history. Family History  Problem Relation Age of Onset  . Diabetes Mother   . Hypertension Mother   . Hyperlipidemia Father    Social History  Substance Use Topics  . Smoking status: Former Smoker -- 0.25 packs/day    Types: Cigarettes    Quit date: 12/09/2014  . Smokeless tobacco: Never Used  . Alcohol Use: Yes     Comment: 1 drink every other month or so, socially    Review of Systems  Constitutional: Negative for activity change and appetite change.  Eyes: Negative for pain.  Respiratory: Negative for chest tightness and shortness of breath.   Cardiovascular: Positive for chest pain. Negative for leg swelling.  Gastrointestinal: Negative for nausea, vomiting, abdominal pain and diarrhea.  Genitourinary: Negative for flank pain.  Musculoskeletal: Negative for back pain and neck stiffness.  Skin:  Negative for rash.  Neurological: Negative for weakness, numbness and headaches.  Psychiatric/Behavioral: Negative for behavioral problems.  All other systems reviewed and are negative.     Allergies  Review of patient's allergies indicates no known allergies.  Home Medications   Prior to Admission medications   Medication Sig Start Date End Date Taking? Authorizing Provider  albuterol (PROVENTIL HFA;VENTOLIN HFA) 108 (90 BASE) MCG/ACT inhaler Inhale 2 puffs into the lungs every 6 (six) hours as needed for wheezing. 07/09/15  Yes Gordy Savers, MD  beclomethasone (QVAR) 80 MCG/ACT inhaler Inhale 1 puff into the lungs 2 (two) times daily. 07/09/15  Yes Gordy Savers, MD  naproxen (NAPROSYN) 500 MG tablet Take 1 tablet (500 mg total) by mouth 2 (two) times daily. 08/21/15  Yes Riki Sheer, PA-C  diclofenac sodium (VOLTAREN) 1 % GEL Apply 2 g topically 4 (four) times daily. Patient not taking: Reported on 09/02/2015 08/21/15   Riki Sheer, PA-C   BP 139/73 mmHg  Pulse 62  Temp(Src) 98.7 F (37.1 C) (Oral)  Resp 21  SpO2 97% Physical Exam  Constitutional: He appears well-developed and well-nourished.  HENT:  Head: Atraumatic.  Cardiovascular: Normal rate.   Pulmonary/Chest: Effort normal.   Mild tenderness to anterior chest wall bilaterally.  Abdominal: Soft.  Musculoskeletal: He exhibits no edema.  Neurological: He is alert.  Skin: Skin is warm.  Vitals reviewed.   ED Course  Procedures (including critical care time) Labs Review Labs Reviewed  BASIC  METABOLIC PANEL - Abnormal; Notable for the following:    Glucose, Bld 109 (*)    All other components within normal limits  I-STAT CHEM 8, ED - Abnormal; Notable for the following:    Glucose, Bld 104 (*)    All other components within normal limits  CBC  I-STAT TROPOININ, ED    Imaging Review No results found. I have personally reviewed and evaluated these images and lab results as part of my medical  decision-making.   EKG Interpretation   Date/Time:  Wednesday September 02 2015 17:07:53 EDT Ventricular Rate:  94 PR Interval:  136 QRS Duration: 96 QT Interval:  352 QTC Calculation: 440 R Axis:   100 Text Interpretation:  Normal sinus rhythm Rightward axis Borderline ECG  Confirmed by Rubin Payor  MD, Harrold Donath [phone]) on 09/02/2015 6:14:06 PM      MDM   Final diagnoses:  Chest pain, unspecified chest pain type    Chest pain. Doubt cardiac cause. Will d/c home.     Benjiman Core, MD 09/02/15 6045  Benjiman Core, MD 09/30/15 [phone]

## 2015-09-02 NOTE — ED Notes (Signed)
NAD. Pt verbalized understanding of discharge orders.

## 2015-09-02 NOTE — Discharge Instructions (Signed)

## 2015-09-02 NOTE — ED Notes (Signed)
Pt states that he has had intermittent chest pain since Saturday. Pt states that pain is in the left side of his chest with no radiation. Pt states that the pain worsened today. Denies any associated symptoms.

## 2016-01-12 ENCOUNTER — Encounter: Payer: Self-pay | Admitting: Family Medicine

## 2016-01-12 ENCOUNTER — Ambulatory Visit (INDEPENDENT_AMBULATORY_CARE_PROVIDER_SITE_OTHER): Payer: 59 | Admitting: Family Medicine

## 2016-01-12 VITALS — BP 141/86 | HR 82 | Temp 98.7°F | Ht 73.0 in | Wt 169.0 lb

## 2016-01-12 DIAGNOSIS — J4520 Mild intermittent asthma, uncomplicated: Secondary | ICD-10-CM

## 2016-01-12 DIAGNOSIS — K439 Ventral hernia without obstruction or gangrene: Secondary | ICD-10-CM | POA: Diagnosis not present

## 2016-01-12 DIAGNOSIS — J3089 Other allergic rhinitis: Secondary | ICD-10-CM | POA: Diagnosis not present

## 2016-01-12 HISTORY — DX: Ventral hernia without obstruction or gangrene: K43.9

## 2016-01-12 MED ORDER — ALBUTEROL SULFATE HFA 108 (90 BASE) MCG/ACT IN AERS: 2.0000 | INHALATION_SPRAY | Freq: Four times a day (QID) | RESPIRATORY_TRACT | Status: DC | PRN

## 2016-01-12 MED ORDER — BECLOMETHASONE DIPROPIONATE 80 MCG/ACT IN AERS: 1.0000 | INHALATION_SPRAY | Freq: Two times a day (BID) | RESPIRATORY_TRACT | Status: DC

## 2016-01-12 NOTE — Progress Notes (Signed)
   Subjective:    Patient ID: Austin Curry, male    DOB: Dec 20, 1981, 35 y.o.   MRN: [id number]  HPI Here to check a hernia on the abdominal wall that has been present for about 10 years but which has become more painful over the past few weeks. BMs are normal. Sometimes he as to lis down and take Ibuprofen for relief.    Review of Systems  Constitutional: Negative.   Respiratory: Negative.   Cardiovascular: Negative.   Gastrointestinal: Positive for abdominal pain. Negative for nausea, vomiting, diarrhea, constipation, blood in stool, abdominal distention, anal bleeding and rectal pain.       Objective:   Physical Exam  Constitutional: He appears well-developed and well-nourished.  Cardiovascular: Normal rate, regular rhythm, normal heart sounds and intact distal pulses.   Pulmonary/Chest: Effort normal and breath sounds normal.  Abdominal: Soft. Bowel sounds are normal. He exhibits no distension. There is no rebound and no guarding.  Tender reducible ventral hernia just above the umbilicus           Assessment & Plan:  Ventral hernia that may require surgery. Refer to Surgery.

## 2016-01-12 NOTE — Progress Notes (Signed)
Pre visit review using our clinic review tool, if applicable. No additional management support is needed unless otherwise documented below in the visit note. 

## 2016-01-26 ENCOUNTER — Other Ambulatory Visit: Payer: Self-pay | Admitting: Surgery

## 2016-02-15 ENCOUNTER — Emergency Department (HOSPITAL_COMMUNITY)
Admission: EM | Admit: 2016-02-15 | Discharge: 2016-02-15 | Disposition: A | Discharge status: Home / Self Care | Payer: Self-pay | Attending: Emergency Medicine | Admitting: Emergency Medicine

## 2016-02-15 ENCOUNTER — Encounter (HOSPITAL_COMMUNITY): Payer: Self-pay

## 2016-02-15 DIAGNOSIS — Z87891 Personal history of nicotine dependence: Secondary | ICD-10-CM | POA: Insufficient documentation

## 2016-02-15 DIAGNOSIS — Z7951 Long term (current) use of inhaled steroids: Secondary | ICD-10-CM | POA: Insufficient documentation

## 2016-02-15 DIAGNOSIS — J45909 Unspecified asthma, uncomplicated: Secondary | ICD-10-CM | POA: Insufficient documentation

## 2016-02-15 DIAGNOSIS — Z79899 Other long term (current) drug therapy: Secondary | ICD-10-CM | POA: Insufficient documentation

## 2016-02-15 DIAGNOSIS — K439 Ventral hernia without obstruction or gangrene: Secondary | ICD-10-CM

## 2016-02-15 MED ORDER — TRAMADOL HCL 50 MG PO TABS: 50.0000 mg | ORAL_TABLET | Freq: Four times a day (QID) | ORAL | Status: DC | PRN

## 2016-02-15 NOTE — Discharge Instructions (Signed)
Ventral Hernia A ventral hernia (also called an incisional hernia) is a hernia that occurs at the site of a previous surgical cut (incision) in the abdomen. The abdominal wall spans from your lower chest down to your pelvis. If the abdominal wall is weakened from a surgical incision, a hernia can occur. A hernia is a bulge of bowel or muscle tissue pushing out on the weakened part of the abdominal wall. Ventral hernias can get bigger from straining or lifting. Obese and older people are at higher risk for a ventral hernia. People who develop infections after surgery or require repeat incisions at the same site on the abdomen are also at increased risk. CAUSES  A ventral hernia occurs because of weakness in the abdominal wall at an incision site.  SYMPTOMS  Common symptoms include:  A visible bulge or lump on the abdominal wall.  Pain or tenderness around the lump.  Increased discomfort if you cough or make a sudden movement. If the hernia has blocked part of the intestine, a serious complication can occur (incarcerated or strangulated hernia). This can become a problem that requires emergency surgery because the blood flow to the blocked intestine may be cut off. Symptoms may include:  Feeling sick to your stomach (nauseous).  Throwing up (vomiting).  Stomach swelling (distention) or bloating.  Fever.  Rapid heartbeat. DIAGNOSIS  Your health care provider will take a medical history and perform a physical exam. Various tests may be ordered, such as:  Blood tests.  Urine tests.  Ultrasonography.  X-rays.  Computed tomography (CT). TREATMENT  Watchful waiting may be all that is needed for a smaller hernia that does not cause symptoms. Your health care provider may recommend the use of a supportive belt (truss) that helps to keep the abdominal wall intact. For larger hernias or those that cause pain, surgery to repair the hernia is usually recommended. If a hernia becomes  strangulated, emergency surgery needs to be done right away. HOME CARE INSTRUCTIONS  Avoid putting pressure or strain on the abdominal area.  Avoid heavy lifting.  Use good body positioning for physical tasks. Ask your health care provider about proper body positioning.  Use a supportive belt as directed by your health care provider.  Maintain a healthy weight.  Eat foods that are high in fiber, such as whole grains, fruits, and vegetables. Fiber helps prevent difficult bowel movements (constipation).  Drink enough fluids to keep your urine clear or pale yellow.  Follow up with your health care provider as directed. SEEK MEDICAL CARE IF:   Your hernia seems to be getting larger or more painful. SEEK IMMEDIATE MEDICAL CARE IF:   You have abdominal pain that is sudden and sharp.  Your pain becomes severe.  You have repeated vomiting.  You are sweating a lot.  You notice a rapid heartbeat.  You develop a fever. MAKE SURE YOU:   Understand these instructions.  Will watch your condition.  Will get help right away if you are not doing well or get worse.   This information is not intended to replace advice given to you by your health care provider. Make sure you discuss any questions you have with your health care provider.  Follow up with your general surgeon's office for re-evaluation. Take pain medication as needed. Avoid heavy lifting. Return to the Emergency department if you experience severe worsening of your symptoms, inability to have a bowel movement, vomiting, fever, severe increase in your pain unrelieved by pain medication. See  above for additional symptoms that would warrant return to the Emergency Department.

## 2016-02-15 NOTE — ED Notes (Signed)
Declined W/C at D/C and was escorted to lobby by RN. 

## 2016-02-15 NOTE — ED Notes (Signed)
Patient here with umbilical hernia pain, reports that he does a lot of moving at work and pain increased, area soft and no swelling noted

## 2016-02-15 NOTE — ED Provider Notes (Signed)
CSN: [id number]     Arrival date & time 02/15/16  1459 History  By signing my name below, I, Soijett Blue, attest that this documentation has been prepared under the direction and in the presence of Gaylyn Rong, PA-C Electronically Signed: Soijett Blue, ED Scribe. 02/15/2016. 4:42 PM.   Chief Complaint  Patient presents with  . pain to umbilical hernia      The history is provided by the patient. No language interpreter was used.     HPI Comments: Austin Curry is a 35 y.o. male with a medical hx of hernia who presents to the Emergency Department complaining of constant umbilical hernia pain x 10 years worsening recently. He notes that this pain is the same pain that he has been evaluated for in January. Pt reports that his umbilical hernia is reducible with moderate pain when attempted. Pt states that since he has been working the pain has worsened and he works for [employer]. Pt last bowel movement was this afternoon. Pt has been seen by his PCP and a surgeon, Dr. Rayburn Ma since January for his symptoms. Pt has been prescribed norco for the relief of his symptoms. Pt was informed that he will need to have surgery and he hasn't had it yet due to issues with his insurance. He states that he has tried Rx norco for the relief for his symptoms. He reports that he is not taking any other medications for the relief of his symptoms. He denies fever, testicular pain, n/v, and any other symptoms.  Past Medical History  Diagnosis Date  . Asthma   . Hernia    History reviewed. No pertinent past surgical history. Family History  Problem Relation Age of Onset  . Diabetes Mother   . Hypertension Mother   . Hyperlipidemia Father    Social History  Substance Use Topics  . Smoking status: Former Smoker -- 0.25 packs/day    Types: Cigarettes    Quit date: 12/09/2014  . Smokeless tobacco: Never Used  . Alcohol Use: 0.0 oz/week    0 Standard drinks or equivalent per week     Comment: 1 drink every  other month or so, socially    Review of Systems  Constitutional: Negative for fever.  Gastrointestinal: Negative for nausea, vomiting and constipation.       Pain to umbilical hernia  Genitourinary: Negative for testicular pain.  All other systems reviewed and are negative.     Allergies  Review of patient's allergies indicates no known allergies.  Home Medications   Prior to Admission medications   Medication Sig Start Date End Date Taking? Authorizing Provider  albuterol (PROVENTIL HFA;VENTOLIN HFA) 108 (90 Base) MCG/ACT inhaler Inhale 2 puffs into the lungs every 6 (six) hours as needed for wheezing. 01/12/16   Nelwyn Salisbury, MD  beclomethasone (QVAR) 80 MCG/ACT inhaler Inhale 1 puff into the lungs 2 (two) times daily. 01/12/16   Nelwyn Salisbury, MD  diclofenac sodium (VOLTAREN) 1 % GEL Apply 2 g topically 4 (four) times daily. Patient not taking: Reported on 09/02/2015 08/21/15   Riki Sheer, PA-C  naproxen (NAPROSYN) 500 MG tablet Take 1 tablet (500 mg total) by mouth 2 (two) times daily. Patient not taking: Reported on 01/12/2016 08/21/15   Riki Sheer, PA-C   BP 104/81 mmHg  Pulse 85  Temp(Src) 98.8 F (37.1 C) (Oral)  Resp 20  Ht  (1.905 m)  Wt 172 lb (78.019 kg)  BMI 21.50 kg/m2  SpO2 96%  Physical Exam  Constitutional: He is oriented to person, place, and time. He appears well-developed and well-nourished. No distress.  HENT:  Head: Normocephalic and atraumatic.  Mouth/Throat: No oropharyngeal exudate.  Eyes: Conjunctivae and EOM are normal. Pupils are equal, round, and reactive to light. Right eye exhibits no discharge. Left eye exhibits no discharge. No scleral icterus.  Neck: Neck supple.  Cardiovascular: Normal rate, regular rhythm, normal heart sounds and intact distal pulses.  Exam reveals no gallop and no friction rub.   No murmur heard. Pulmonary/Chest: Effort normal and breath sounds normal. No respiratory distress. He has no wheezes. He has no  rales. He exhibits no tenderness.  Abdominal: Soft. Bowel sounds are normal. He exhibits no distension. There is tenderness in the periumbilical area. There is no guarding. A hernia is present. Hernia confirmed positive in the ventral area.  Ventral hernia present, reducible. No sign of incarceration or strangulation. Bowel sound present and active. No testicular pain/swelling  Musculoskeletal: Normal range of motion. He exhibits no edema.  Neurological: He is alert and oriented to person, place, and time.  Skin: Skin is warm and dry. No rash noted. He is not diaphoretic. No erythema. No pallor.  Psychiatric: He has a normal mood and affect. His behavior is normal.  Nursing note and vitals reviewed.   ED Course  Procedures (including critical care time) DIAGNOSTIC STUDIES: Oxygen Saturation is 96% on RA, nl by my interpretation.    COORDINATION OF CARE: 4:19 PM Discussed treatment plan with pt at bedside which includes ultram Rx and follow up with PCP and pt agreed to plan.    Labs Review Labs Reviewed - No data to display  Imaging Review No results found.    EKG Interpretation None      MDM   Final diagnoses:  Ventral hernia without obstruction or gangrene    Pt presents with ventral hernia. Pt has been seen by PCP and surgery for same issues. Pt was scheduled to have surgery to correct this in March but is waiting for insurance to come through so is having to wait for this. Pt is taking hydrocodone for pain which does help, but he is unable to take this at work as it is a narcotic. N sign of incarceration or strangulation on exam. Hernia is reducible. Bowel sounds presents. Pt having normal, regular bowel movements. Will provide pt with additional pain medication for the interim. Recommend contacting insurance companies about retroactive pay. Work note given, no heavy lifting. Educated pts on red flag symptoms of hernia. Return precautions outlined in patient discharge  instructions.    I personally performed the services described in this documentation, which was scribed in my presence. The recorded information has been reviewed and is accurate.     Lester Kinsman [place], PA-C 02/15/16 1741  Benjiman Core, MD 02/16/16 [phone]

## 2016-02-15 NOTE — ED Notes (Signed)
PT reports he saw a surgeon 01-26-16

## 2016-02-16 ENCOUNTER — Ambulatory Visit (INDEPENDENT_AMBULATORY_CARE_PROVIDER_SITE_OTHER): Payer: Self-pay | Admitting: Internal Medicine

## 2016-02-16 ENCOUNTER — Ambulatory Visit: Payer: Self-pay | Admitting: Internal Medicine

## 2016-02-16 ENCOUNTER — Encounter: Payer: Self-pay | Admitting: Internal Medicine

## 2016-02-16 VITALS — BP 110/70 | HR 88 | Temp 98.4°F | Resp 20 | Ht 75.0 in | Wt 169.0 lb

## 2016-02-16 DIAGNOSIS — K439 Ventral hernia without obstruction or gangrene: Secondary | ICD-10-CM

## 2016-02-16 NOTE — Progress Notes (Signed)
Pre visit review using our clinic review tool, if applicable. No additional management support is needed unless otherwise documented below in the visit note. 

## 2016-02-16 NOTE — Patient Instructions (Addendum)
General surgery follow-up as scheduled  Call or return to clinic prn if these symptoms worsen or fail to improve as anticipated.

## 2016-02-16 NOTE — Progress Notes (Signed)
Subjective:    Patient ID: Austin Curry, male    DOB: 1981-09-28, 35 y.o.   MRN: [id number]  HPI  35 year old patient who has a history of a chronic ventral hernia.  This has been symptomatic for some months and he has been referred to general surgery.  Elective surgery is pending availability of insurance benefits.  He has been seen in the ED also and has been on tramadol.  He has a note from general surgery restricting heavy lifting to 20 pounds.  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  Hours  Past Medical History  Diagnosis Date  . Asthma   . Hernia     Social History   Social History  . Marital Status: Single    Spouse Name: N/A  . Number of Children: N/A  . Years of Education: N/A   Occupational History  . Not on file.   Social History Main Topics  . Smoking status: Former Smoker -- 0.25 packs/day    Types: Cigarettes    Quit date: 12/09/2014  . Smokeless tobacco: Never Used  . Alcohol Use: 0.0 oz/week    0 Standard drinks or equivalent per week     Comment: 1 drink every other month or so, socially  . Drug Use: No     Comment: Stopped Marjuiana 03/24/2014  . Sexual Activity: Not on file   Other Topics Concern  . Not on file   Social History Narrative    No past surgical history on file.  Family History  Problem Relation Age of Onset  . Diabetes Mother   . Hypertension Mother   . Hyperlipidemia Father     No Known Allergies  Current Outpatient Prescriptions on File Prior to Visit  Medication Sig Dispense Refill  . albuterol (PROVENTIL HFA;VENTOLIN HFA) 108 (90 Base) MCG/ACT inhaler Inhale 2 puffs into the lungs every 6 (six) hours as needed for wheezing. 1 each 11  . beclomethasone (QVAR) 80 MCG/ACT inhaler Inhale 1 puff into the lungs 2 (two) times daily. 1 Inhaler 11  . traMADol (ULTRAM) 50 MG tablet Take 1 tablet (50 mg total) by mouth every 6 (six) hours as needed. (Patient not taking: Reported on 02/16/2016) 15 tablet 0    No current facility-administered medications on file prior to visit.    BP 110/70 mmHg  Pulse 88  Temp(Src) 98.4 F (36.9 C) (Oral)  Resp 20  Ht  (1.905 m)  Wt 169 lb (76.658 kg)  BMI 21.12 kg/m2  SpO2 98%     Review of Systems  Constitutional: Negative for fever, chills, appetite change and fatigue.  HENT: Negative for congestion, dental problem, ear pain, hearing loss, sore throat, tinnitus, trouble swallowing and voice change.   Eyes: Negative for pain, discharge and visual disturbance.  Respiratory: Negative for cough, chest tightness, wheezing and stridor.   Cardiovascular: Negative for chest pain, palpitations and leg swelling.  Gastrointestinal: Positive for abdominal pain. Negative for nausea, vomiting, diarrhea, constipation, blood in stool and abdominal distention.  Genitourinary: Negative for urgency, hematuria, flank pain, discharge, difficulty urinating and genital sores.  Musculoskeletal: Negative for myalgias, back pain, joint swelling, arthralgias, gait problem and neck stiffness.  Skin: Negative for rash.  Neurological: Negative for dizziness, syncope, speech difficulty, weakness, numbness and headaches.  Hematological: Negative for adenopathy. Does not bruise/bleed easily.  Psychiatric/Behavioral: Negative for behavioral problems and dysphoric mood. The patient is not nervous/anxious.        Objective:  Physical Exam  Constitutional: He is oriented to person, place, and time. He appears well-developed.  HENT:  Head: Normocephalic.  Right Ear: External ear normal.  Left Ear: External ear normal.  Eyes: Conjunctivae and EOM are normal.  Neck: Normal range of motion.  Cardiovascular: Normal rate and normal heart sounds.   Pulmonary/Chest: Breath sounds normal. He has no wheezes.  Abdominal: Soft. Bowel sounds are normal.  3-4 cm ventral hernia just superior to the umbilicus.  Easily reducible  Musculoskeletal: Normal range of motion. He exhibits  no edema or tenderness.  Neurological: He is alert and oriented to person, place, and time.  Psychiatric: He has a normal mood and affect. His behavior is normal.          Assessment & Plan:   Symptomatic ventral hernia.  The patient has been evaluated by general surgery.  Will have elective surgery when he has insurance coverage.  He does have analgesics from the ED A note was written on his behalf to limit workdays to 8 hours and work weeks  To 40 hours and to avoid heavy lifting in excess of 20 pounds

## 2016-04-06 ENCOUNTER — Encounter (HOSPITAL_COMMUNITY): Payer: Self-pay | Admitting: Emergency Medicine

## 2016-04-06 ENCOUNTER — Emergency Department (HOSPITAL_COMMUNITY)
Admission: EM | Admit: 2016-04-06 | Discharge: 2016-04-06 | Disposition: A | Discharge status: Home / Self Care | Payer: Self-pay | Attending: Emergency Medicine | Admitting: Emergency Medicine

## 2016-04-06 DIAGNOSIS — J45901 Unspecified asthma with (acute) exacerbation: Secondary | ICD-10-CM | POA: Insufficient documentation

## 2016-04-06 DIAGNOSIS — Z8719 Personal history of other diseases of the digestive system: Secondary | ICD-10-CM | POA: Insufficient documentation

## 2016-04-06 DIAGNOSIS — Z87891 Personal history of nicotine dependence: Secondary | ICD-10-CM | POA: Insufficient documentation

## 2016-04-06 DIAGNOSIS — Z7951 Long term (current) use of inhaled steroids: Secondary | ICD-10-CM | POA: Insufficient documentation

## 2016-04-06 MED ORDER — IPRATROPIUM-ALBUTEROL 0.5-2.5 (3) MG/3ML IN SOLN
3.0000 mL | Freq: Once | RESPIRATORY_TRACT | Status: AC
  Administered 2016-04-06: 3 mL via RESPIRATORY_TRACT
  Filled 2016-04-06: qty 3

## 2016-04-06 MED ORDER — ALBUTEROL SULFATE (2.5 MG/3ML) 0.083% IN NEBU: 2.5000 mg | INHALATION_SOLUTION | RESPIRATORY_TRACT | Status: DC | PRN

## 2016-04-06 MED ORDER — PREDNISONE 20 MG PO TABS
60.0000 mg | ORAL_TABLET | Freq: Once | ORAL | Status: AC
  Administered 2016-04-06: 60 mg via ORAL
  Filled 2016-04-06: qty 3

## 2016-04-06 MED ORDER — ALBUTEROL SULFATE HFA 108 (90 BASE) MCG/ACT IN AERS
2.0000 | INHALATION_SPRAY | RESPIRATORY_TRACT | Status: DC | PRN
  Filled 2016-04-06: qty 6.7

## 2016-04-06 MED ORDER — PREDNISONE 10 MG PO TABS: 40.0000 mg | ORAL_TABLET | Freq: Every day | ORAL | Status: DC

## 2016-04-06 NOTE — ED Provider Notes (Signed)
CSN: [id number]     Arrival date & time 04/06/16  0505 History   First MD Initiated Contact with Patient 04/06/16 0522     Chief Complaint  Patient presents with  . Shortness of Breath     (Consider location/radiation/quality/duration/timing/severity/associated sxs/prior Treatment) HPI Comments: Patient presents with shortness of breath. He has a history of asthma. He states that over the last 2 days he's had worsening asthma symptoms with shortness of breath cough and wheezing. He does tend to get worse during this time of year with the pollen. He reports some mild runny nose and congestion. No fevers. No chest pain. No productive cough. He denies any leg pain or swelling. He used his inhaler yesterday with some improvement in symptoms but currently is out of his inhaler.  Patient is a 35 y.o. male presenting with shortness of breath.  Shortness of Breath Associated symptoms: cough and wheezing   Associated symptoms: no abdominal pain, no chest pain, no diaphoresis, no fever, no headaches, no rash and no vomiting     Past Medical History  Diagnosis Date  . Asthma   . Hernia    History reviewed. No pertinent past surgical history. Family History  Problem Relation Age of Onset  . Diabetes Mother   . Hypertension Mother   . Hyperlipidemia Father    Social History  Substance Use Topics  . Smoking status: Former Smoker -- 0.25 packs/day    Types: Cigarettes    Quit date: 12/09/2014  . Smokeless tobacco: Never Used  . Alcohol Use: 0.0 oz/week    0 Standard drinks or equivalent per week     Comment: 1 drink every other month or so, socially    Review of Systems  Constitutional: Negative for fever, chills, diaphoresis and fatigue.  HENT: Negative for congestion, rhinorrhea and sneezing.   Eyes: Negative.   Respiratory: Positive for cough, shortness of breath and wheezing. Negative for chest tightness.   Cardiovascular: Negative for chest pain and leg swelling.   Gastrointestinal: Negative for nausea, vomiting, abdominal pain, diarrhea and blood in stool.  Genitourinary: Negative for frequency, hematuria, flank pain and difficulty urinating.  Musculoskeletal: Negative for back pain and arthralgias.  Skin: Negative for rash.  Neurological: Negative for dizziness, speech difficulty, weakness, numbness and headaches.      Allergies  Review of patient's allergies indicates no known allergies.  Home Medications   Prior to Admission medications   Medication Sig Start Date End Date Taking? Authorizing Provider  beclomethasone (QVAR) 80 MCG/ACT inhaler Inhale 1 puff into the lungs 2 (two) times daily. 01/12/16  Yes Nelwyn Salisbury, MD  cetirizine (ZYRTEC) 10 MG tablet Take 10 mg by mouth daily as needed for allergies.   Yes Historical Provider, MD  albuterol (PROVENTIL) (2.5 MG/3ML) 0.083% nebulizer solution Take 3 mLs (2.5 mg total) by nebulization every 4 (four) hours as needed for wheezing or shortness of breath. 04/06/16   Rolan Bucco, MD  predniSONE (DELTASONE) 10 MG tablet Take 4 tablets (40 mg total) by mouth daily. 04/06/16   Rolan Bucco, MD  traMADol (ULTRAM) 50 MG tablet Take 1 tablet (50 mg total) by mouth every 6 (six) hours as needed. Patient not taking: Reported on 02/16/2016 02/15/16   Samantha Tripp Dowless, PA-C   BP 135/89 mmHg  Pulse 63  Temp(Src) 97.9 F (36.6 C) (Oral)  Resp 20  Ht  (1.905 m)  Wt 172 lb (78.019 kg)  BMI 21.50 kg/m2  SpO2 95% Physical Exam  Constitutional:  He is oriented to person, place, and time. He appears well-developed and well-nourished.  HENT:  Head: Normocephalic and atraumatic.  Eyes: Pupils are equal, round, and reactive to light.  Neck: Normal range of motion. Neck supple.  Cardiovascular: Normal rate, regular rhythm and normal heart sounds.   Pulmonary/Chest: Effort normal. No respiratory distress. He has wheezes. He has no rales. He exhibits no tenderness.  Moderate expiratory wheezing with  some diminished breath sounds bilaterally. No increased work of breathing.  Abdominal: Soft. Bowel sounds are normal. There is no tenderness. There is no rebound and no guarding.  Musculoskeletal: Normal range of motion. He exhibits no edema.  No edema or calf tenderness  Lymphadenopathy:    He has no cervical adenopathy.  Neurological: He is alert and oriented to person, place, and time.  Skin: Skin is warm and dry. No rash noted.  Psychiatric: He has a normal mood and affect.    ED Course  Procedures (including critical care time) Labs Review Labs Reviewed - No data to display  Imaging Review No results found. I have personally reviewed and evaluated these images and lab results as part of my medical decision-making.   EKG Interpretation None      MDM   Final diagnoses:  Asthma exacerbation    Patient presents with wheezing and shortness of breath consistent with his past asthma exacerbations. He was given nebulizer treatment in the ED and is feeling much better after this. He has some scarce wheezing following this but feels pretty much back to baseline. He's talking in full sentences with no increased work of breathing. He is maintaining normal oxygen saturations. He has no fever or productive cough or other symptoms that would warrant chest x-ray. He was discharged home in good condition. He was dispensed an albuterol inhaler. He was given a prescription for his albuterol nebulizer solution as well as a continuation of a five-day course of prednisone. He was given the first dose in the ED.    Rolan BuccoMelanie Joneric Streight, MD 04/06/16 [phone]

## 2016-04-06 NOTE — Discharge Instructions (Signed)
Asthma, Adult Asthma is a recurring condition in which the airways tighten and narrow. Asthma can make it difficult to breathe. It can cause coughing, wheezing, and shortness of breath. Asthma episodes, also called asthma attacks, range from minor to life-threatening. Asthma cannot be cured, but medicines and lifestyle changes can help control it. CAUSES Asthma is believed to be caused by inherited (genetic) and environmental factors, but its exact cause is unknown. Asthma may be triggered by allergens, lung infections, or irritants in the air. Asthma triggers are different for each person. Common triggers include:   Animal dander.  Dust mites.  Cockroaches.  Pollen from trees or grass.  Mold.  Smoke.  Air pollutants such as dust, household cleaners, hair sprays, aerosol sprays, paint fumes, strong chemicals, or strong odors.  Cold air, weather changes, and winds (which increase molds and pollens in the air).  Strong emotional expressions such as crying or laughing hard.  Stress.  Certain medicines (such as aspirin) or types of drugs (such as beta-blockers).  Sulfites in foods and drinks. Foods and drinks that may contain sulfites include dried fruit, potato chips, and sparkling grape juice.  Infections or inflammatory conditions such as the flu, a cold, or an inflammation of the nasal membranes (rhinitis).  Gastroesophageal reflux disease (GERD).  Exercise or strenuous activity. SYMPTOMS Symptoms may occur immediately after asthma is triggered or many hours later. Symptoms include:  Wheezing.  Excessive nighttime or early morning coughing.  Frequent or severe coughing with a common cold.  Chest tightness.  Shortness of breath. DIAGNOSIS  The diagnosis of asthma is made by a review of your medical history and a physical exam. Tests may also be performed. These may include:  Lung function studies. These tests show how much air you breathe in and out.  Allergy  tests.  Imaging tests such as X-rays. TREATMENT  Asthma cannot be cured, but it can usually be controlled. Treatment involves identifying and avoiding your asthma triggers. It also involves medicines. There are 2 classes of medicine used for asthma treatment:   Controller medicines. These prevent asthma symptoms from occurring. They are usually taken every day.  Reliever or rescue medicines. These quickly relieve asthma symptoms. They are used as needed and provide short-term relief. Your health care provider will help you create an asthma action plan. An asthma action plan is a written plan for managing and treating your asthma attacks. It includes a list of your asthma triggers and how they may be avoided. It also includes information on when medicines should be taken and when their dosage should be changed. An action plan may also involve the use of a device called a peak flow meter. A peak flow meter measures how well the lungs are working. It helps you monitor your condition. HOME CARE INSTRUCTIONS   Take medicines only as directed by your health care provider. Speak with your health care provider if you have questions about how or when to take the medicines.  Use a peak flow meter as directed by your health care provider. Record and keep track of readings.  Understand and use the action plan to help minimize or stop an asthma attack without needing to seek medical care.  Control your home environment in the following ways to help prevent asthma attacks:  Do not smoke. Avoid being exposed to secondhand smoke.  Change your heating and air conditioning filter regularly.  Limit your use of fireplaces and wood stoves.  Get rid of pests (such as roaches   and mice) and their droppings.  Throw away plants if you see mold on them.  Clean your floors and dust regularly. Use unscented cleaning products.  Try to have someone else vacuum for you regularly. Stay out of rooms while they are  being vacuumed and for a short while afterward. If you vacuum, use a dust mask from a hardware store, a double-layered or microfilter vacuum cleaner bag, or a vacuum cleaner with a HEPA filter.  Replace carpet with wood, tile, or vinyl flooring. Carpet can trap dander and dust.  Use allergy-proof pillows, mattress covers, and box spring covers.  Wash bed sheets and blankets every week in hot water and dry them in a dryer.  Use blankets that are made of polyester or cotton.  Clean bathrooms and kitchens with bleach. If possible, have someone repaint the walls in these rooms with mold-resistant paint. Keep out of the rooms that are being cleaned and painted.  Wash hands frequently. SEEK MEDICAL CARE IF:   You have wheezing, shortness of breath, or a cough even if taking medicine to prevent attacks.  The colored mucus you cough up (sputum) is thicker than usual.  Your sputum changes from clear or white to yellow, green, gray, or bloody.  You have any problems that may be related to the medicines you are taking (such as a rash, itching, swelling, or trouble breathing).  You are using a reliever medicine more than 2-3 times per week.  Your peak flow is still at 50-79% of your personal best after following your action plan for 1 hour.  You have a fever. SEEK IMMEDIATE MEDICAL CARE IF:   You seem to be getting worse and are unresponsive to treatment during an asthma attack.  You are short of breath even at rest.  You get short of breath when doing very little physical activity.  You have difficulty eating, drinking, or talking due to asthma symptoms.  You develop chest pain.  You develop a fast heartbeat.  You have a bluish color to your lips or fingernails.  You are light-headed, dizzy, or faint.  Your peak flow is less than 50% of your personal best.   This information is not intended to replace advice given to you by your health care provider. Make sure you discuss any  questions you have with your health care provider.   Document Released: 12/12/2005 Document Revised: 09/02/2015 Document Reviewed: 07/11/2013 Elsevier Interactive Patient Education 2016 [employer]  

## 2016-04-06 NOTE — ED Notes (Signed)
Per pt, he started feeling short of breath x 2 days ago. Pt states it was worse this morning and he had difficulty sleeping due to the shortness of breath. Hx of asthma.

## 2016-04-06 NOTE — ED Notes (Signed)
Pt verbalized understanding of discharge instructions and follow up care. Demonstrated appropriate use of albuterol inhaler. Vital signs stable at discharge.

## 2016-04-07 ENCOUNTER — Telehealth: Payer: Self-pay | Admitting: Internal Medicine

## 2016-04-07 ENCOUNTER — Encounter (HOSPITAL_BASED_OUTPATIENT_CLINIC_OR_DEPARTMENT_OTHER): Payer: Self-pay | Admitting: Emergency Medicine

## 2016-04-07 ENCOUNTER — Emergency Department (HOSPITAL_BASED_OUTPATIENT_CLINIC_OR_DEPARTMENT_OTHER): Payer: Self-pay

## 2016-04-07 ENCOUNTER — Observation Stay (HOSPITAL_BASED_OUTPATIENT_CLINIC_OR_DEPARTMENT_OTHER)
Admission: EM | Admit: 2016-04-07 | Discharge: 2016-04-08 | Disposition: A | Discharge status: Home / Self Care | Payer: Self-pay | Attending: Internal Medicine | Admitting: Internal Medicine

## 2016-04-07 DIAGNOSIS — J45901 Unspecified asthma with (acute) exacerbation: Principal | ICD-10-CM | POA: Insufficient documentation

## 2016-04-07 DIAGNOSIS — J189 Pneumonia, unspecified organism: Secondary | ICD-10-CM

## 2016-04-07 DIAGNOSIS — Z87891 Personal history of nicotine dependence: Secondary | ICD-10-CM | POA: Insufficient documentation

## 2016-04-07 DIAGNOSIS — K439 Ventral hernia without obstruction or gangrene: Secondary | ICD-10-CM | POA: Insufficient documentation

## 2016-04-07 DIAGNOSIS — J45902 Unspecified asthma with status asthmaticus: Secondary | ICD-10-CM

## 2016-04-07 LAB — CBC WITH DIFFERENTIAL/PLATELET
Basophils Absolute: 0 10*3/uL (ref 0.0–0.1)
Basophils Relative: 1 %
EOS ABS: 0.4 10*3/uL (ref 0.0–0.7)
EOS PCT: 5 %
HCT: 44.1 % (ref 39.0–52.0)
Hemoglobin: 14.4 g/dL (ref 13.0–17.0)
LYMPHS ABS: 3.3 10*3/uL (ref 0.7–4.0)
Lymphocytes Relative: 37 %
MCH: 30.1 pg (ref 26.0–34.0)
MCHC: 32.7 g/dL (ref 30.0–36.0)
MCV: 92.1 fL (ref 78.0–100.0)
MONOS PCT: 8 %
Monocytes Absolute: 0.7 10*3/uL (ref 0.1–1.0)
NEUTROS PCT: 49 %
Neutro Abs: 4.4 10*3/uL (ref 1.7–7.7)
PLATELETS: 159 10*3/uL (ref 150–400)
RBC: 4.79 MIL/uL (ref 4.22–5.81)
RDW: 13.6 % (ref 11.5–15.5)
WBC: 8.8 10*3/uL (ref 4.0–10.5)

## 2016-04-07 LAB — BASIC METABOLIC PANEL
Anion gap: 6 (ref 5–15)
BUN: 16 mg/dL (ref 6–20)
CALCIUM: 9.1 mg/dL (ref 8.9–10.3)
CO2: 27 mmol/L (ref 22–32)
CREATININE: 1.19 mg/dL (ref 0.61–1.24)
Chloride: 108 mmol/L (ref 101–111)
GFR calc Af Amer: >60 mL/min (ref >60)
GLUCOSE: 86 mg/dL (ref 65–99)
Potassium: 3.7 mmol/L (ref 3.5–5.1)
SODIUM: 141 mmol/L (ref 135–145)

## 2016-04-07 MED ORDER — ALBUTEROL (5 MG/ML) CONTINUOUS INHALATION SOLN
15.0000 mg | INHALATION_SOLUTION | Freq: Once | RESPIRATORY_TRACT | Status: AC
  Administered 2016-04-07: 15 mg via RESPIRATORY_TRACT

## 2016-04-07 MED ORDER — PREDNISONE 20 MG PO TABS
40.0000 mg | ORAL_TABLET | Freq: Once | ORAL | Status: AC
  Administered 2016-04-07: 40 mg via ORAL
  Filled 2016-04-07: qty 2

## 2016-04-07 MED ORDER — IPRATROPIUM BROMIDE 0.02 % IN SOLN
1.5000 mg | Freq: Once | RESPIRATORY_TRACT | Status: AC
  Administered 2016-04-07: 1.5 mg via RESPIRATORY_TRACT
  Filled 2016-04-07: qty 7.5

## 2016-04-07 MED ORDER — ALBUTEROL (5 MG/ML) CONTINUOUS INHALATION SOLN
10.0000 mg/h | INHALATION_SOLUTION | RESPIRATORY_TRACT | Status: AC
  Administered 2016-04-07: 10 mg/h via RESPIRATORY_TRACT
  Filled 2016-04-07: qty 20

## 2016-04-07 MED ORDER — MAGNESIUM SULFATE 2 GM/50ML IV SOLN
2.0000 g | Freq: Once | INTRAVENOUS | Status: AC
  Administered 2016-04-07: 2 g via INTRAVENOUS
  Filled 2016-04-07: qty 50

## 2016-04-07 MED ORDER — IPRATROPIUM BROMIDE 0.02 % IN SOLN
0.5000 mg | Freq: Once | RESPIRATORY_TRACT | Status: DC
  Filled 2016-04-07: qty 2.5

## 2016-04-07 NOTE — ED Notes (Signed)
Sob for two days.  [facility], given medications.  Pt continues to have difficulty working with medications.

## 2016-04-07 NOTE — ED Notes (Signed)
No acute respiratory distress noted.  Pulse ox 96% on room air.

## 2016-04-07 NOTE — ED Provider Notes (Signed)
CSN: [id number]     Arrival date & time 04/07/16  1854 History  By signing my name below, I, [facility], attest that this documentation has been prepared under the direction and in the presence of Melene Plan, DO. Electronically Signed: Randell Patient, ED Scribe. 04/07/2016. 11:46 PM.   Chief Complaint  Patient presents with  . Shortness of Breath   Patient is a 35 y.o. male presenting with shortness of breath. The history is provided by the patient. No language interpreter was used.  Shortness of Breath Severity:  Mild Onset quality:  Gradual Duration:  2 days Timing:  Constant Progression:  Unchanged Chronicity:  New Ineffective treatments:  Inhaler Associated symptoms: no abdominal pain, no chest pain, no fever, no headaches, no rash and no vomiting   HPI Comments: Austin Curry is a 35 y.o. male with PMHx of asthma who presents to the Emergency Department complaining of constant, mild SOB onset 2 days ago. Patient reports that he was seen yesterday by Dr. Fredderick Phenix at [facility] ED for the same complaint where he was prescribed an albuterol inhaler and prednisone. 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 He has used the albuterol inhaler and taken prednisone without relief. Denies wheezing or any other symptoms.  Past Medical History  Diagnosis Date  . Asthma   . Hernia    No past surgical history on file. Family History  Problem Relation Age of Onset  . Diabetes Mother   . Hypertension Mother   . Hyperlipidemia Father    Social History  Substance Use Topics  . Smoking status: Former Smoker -- 0.25 packs/day    Types: Cigarettes    Quit date: 12/09/2014  . Smokeless tobacco: Never Used  . Alcohol Use: 0.0 oz/week    0 Standard drinks or equivalent per week     Comment: 1 drink every other month or so, socially    Review of Systems  Constitutional: Negative for fever and chills.  HENT: Negative for  congestion and facial swelling.   Eyes: Negative for discharge and visual disturbance.  Respiratory: Positive for shortness of breath.   Cardiovascular: Negative for chest pain and palpitations.  Gastrointestinal: Negative for vomiting, abdominal pain and diarrhea.  Musculoskeletal: Negative for myalgias and arthralgias.  Skin: Negative for color change and rash.  Neurological: Negative for tremors, syncope and headaches.  Psychiatric/Behavioral: Negative for confusion and dysphoric mood.    Allergies  Review of patient's allergies indicates no known allergies.  Home Medications   Prior to Admission medications   Medication Sig Start Date End Date Taking? Authorizing Provider  albuterol (PROVENTIL) (2.5 MG/3ML) 0.083% nebulizer solution Take 3 mLs (2.5 mg total) by nebulization every 4 (four) hours as needed for wheezing or shortness of breath. 04/06/16  Yes Rolan Bucco, MD  beclomethasone (QVAR) 80 MCG/ACT inhaler Inhale 1 puff into the lungs 2 (two) times daily. 01/12/16  Yes Nelwyn Salisbury, MD  predniSONE (DELTASONE) 10 MG tablet Take 4 tablets (40 mg total) by mouth daily. 04/06/16  Yes Rolan Bucco, MD   BP 137/75 mmHg  Pulse 70  Temp(Src) 98.1 F (36.7 C) (Oral)  Resp 18  Ht  (1.905 m)  Wt 172 lb (78.019 kg)  BMI 21.50 kg/m2  SpO2 100% Physical Exam  Constitutional: He is oriented to person, place, and time. He appears well-developed and well-nourished.  HENT:  Head: Normocephalic and atraumatic.  Eyes: EOM are normal. Pupils are equal,  round, and reactive to light.  Neck: Normal range of motion. Neck supple. No JVD present.  Cardiovascular: Normal rate and regular rhythm.  Exam reveals no gallop and no friction rub.   No murmur heard. Pulmonary/Chest: No tachypnea. No respiratory distress. He has decreased breath sounds. He has no wheezes.  Diminished breath sounds in all lung field. Prolonged expirations. No tachypnea.  Abdominal: He exhibits no distension. There  is no rebound and no guarding.  Musculoskeletal: Normal range of motion.  Neurological: He is alert and oriented to person, place, and time.  Skin: No rash noted. No pallor.  Psychiatric: He has a normal mood and affect. His behavior is normal.  Nursing note and vitals reviewed.   ED Course  Procedures   DIAGNOSTIC STUDIES: Oxygen Saturation is 98% on RA, normal by my interpretation.    COORDINATION OF CARE: 9:07 PM Will order breathing treatment and show pt how to use spacer for his inhaler. Discussed treatment plan with pt at bedside and pt agreed to plan.  10:27 PM Returned to re-evaluate pt. Pt has not improved with treatment.  MDM   Final diagnoses:  Status asthmaticus, unspecified asthma severity    35 yo M With shortness of breath. Patient was seen yesterday for the same. Feels like his breathing is gotten worse again. Has a history of asthma also is a smoker. Patient is very diminished on my initial exam was given continuous albuterol treatment with 1.5 mg of ipratropium. Patient with very minimal improvement still having increased work of breathing. Placed back on another continuous chest x-ray unremarkable as viewed by me.  Discussed smoking cessation with patient and was they were offerred resources to help stop.  Total time was 5 min CPT code [id number].    I personally performed the services described in this documentation, which was scribed in my presence. The recorded information has been reviewed and is accurate.   CRITICAL CARE Performed by: Rae Roamaniel Patrick Shyam Dawson   Total critical care time: 80 minutes  Critical care time was exclusive of separately billable procedures and treating other patients.  Critical care was necessary to treat or prevent imminent or life-threatening deterioration.  Critical care was time spent personally by me on the following activities: development of treatment plan with patient and/or surrogate as well as nursing, discussions with  consultants, evaluation of patient's response to treatment, examination of patient, obtaining history from patient or surrogate, ordering and performing treatments and interventions, ordering and review of laboratory studies, ordering and review of radiographic studies, pulse oximetry and re-evaluation of patient's condition.  The patients results and plan were reviewed and discussed.   Any x-rays performed were independently reviewed by myself.   Differential diagnosis were considered with the presenting HPI.  Medications  albuterol (PROVENTIL,VENTOLIN) solution continuous neb (0 mg/hr Nebulization Stopped 04/07/16 2239)  ipratropium (ATROVENT) nebulizer solution 1.5 mg (1.5 mg Nebulization Given 04/07/16 2127)  predniSONE (DELTASONE) tablet 40 mg (40 mg Oral Given 04/07/16 2123)  magnesium sulfate IVPB 2 g 50 mL (0 g Intravenous Stopped 04/07/16 2338)  albuterol (PROVENTIL,VENTOLIN) solution continuous neb (15 mg Nebulization Given 04/07/16 2243)    Filed Vitals:   04/07/16 2127 04/07/16 2245 04/07/16 2247 04/07/16 2341  BP:   137/75   Pulse:   70   Temp:      TempSrc:      Resp:   18   Height:      Weight:      SpO2: 97% 94% 99% 100%  Final diagnoses:  Status asthmaticus, unspecified asthma severity    Admission/ observation were discussed with the admitting physician, patient and/or family and they are comfortable with the plan.    Melene Plan, DO 04/08/16 [phone]

## 2016-04-07 NOTE — Telephone Encounter (Signed)
[facility] Night - Client TELEPHONE ADVICE RECORD [facility] Patient Name: Austin MulliganDEREK Folson DOB: 07-26-1981 Initial Comment Seen in ER yesterday asthma, still having issues breathing, shortness of breath Nurse Assessment Nurse: Ladona RidgelGaddy, RN, Felicia Date/Time (Eastern Time): 04/07/2016 3:56:29 PM Confirm and document reason for call. If symptomatic, describe symptoms. You must click the next button to save text entered. ---Trouble breathing since Tues. ER last night. Has asthma. Has the patient traveled out of the country within the last 30 days? ---No Does the patient have any new or worsening symptoms? ---Yes Will a triage be completed? ---YesRelated visit to physician within the last 2 weeks? ---Yes Does the PT have any chronic conditions? (i.e. diabetes, asthma, etc.) ---Yes List chronic conditions. ---asthma Is this a behavioral health or substance abuse call? ---No Guidelines Guideline Title Affirmed Question Affirmed Notes Asthma Attack Patient sounds very sick or weak to the triager Final Disposition User Go to ED Now (or PCP triage) Ladona RidgelGaddy, RN, Sunny SchleinFelicia Comments decided to send him to ER bc he took proventil 2 h ago and he sounds very short of breath and started coughing several times when nurse was trying to get assessment answers. He spoke in short sentences. Told him to take 4 puffs on proventil now and 4 puffs can be used 20 min from now again on the way - suggested he get someone to drive him but he said he can drive self Referrals Disagree/Comply: Comply Call Id: [id number] [facility]

## 2016-04-07 NOTE — ED Notes (Signed)
Patient transported to X-ray 

## 2016-04-08 ENCOUNTER — Observation Stay (HOSPITAL_COMMUNITY): Payer: Self-pay

## 2016-04-08 ENCOUNTER — Encounter (HOSPITAL_COMMUNITY): Payer: Self-pay | Admitting: Internal Medicine

## 2016-04-08 DIAGNOSIS — J45901 Unspecified asthma with (acute) exacerbation: Secondary | ICD-10-CM

## 2016-04-08 DIAGNOSIS — J45902 Unspecified asthma with status asthmaticus: Secondary | ICD-10-CM | POA: Insufficient documentation

## 2016-04-08 HISTORY — DX: Unspecified asthma with (acute) exacerbation: J45.901

## 2016-04-08 LAB — PHOSPHORUS: PHOSPHORUS: 3.4 mg/dL (ref 2.5–4.6)

## 2016-04-08 MED ORDER — IPRATROPIUM-ALBUTEROL 0.5-2.5 (3) MG/3ML IN SOLN
3.0000 mL | Freq: Four times a day (QID) | RESPIRATORY_TRACT | Status: DC
  Administered 2016-04-08 (×2): 3 mL via RESPIRATORY_TRACT
  Filled 2016-04-08 (×2): qty 3

## 2016-04-08 MED ORDER — MONTELUKAST SODIUM 10 MG PO TABS: 10.0000 mg | ORAL_TABLET | Freq: Every day | ORAL | Status: DC

## 2016-04-08 MED ORDER — IPRATROPIUM BROMIDE 0.02 % IN SOLN: 0.5000 mg | Freq: Four times a day (QID) | RESPIRATORY_TRACT | Status: DC

## 2016-04-08 MED ORDER — IPRATROPIUM-ALBUTEROL 0.5-2.5 (3) MG/3ML IN SOLN
3.0000 mL | Freq: Four times a day (QID) | RESPIRATORY_TRACT | Status: DC
  Administered 2016-04-08: 3 mL via RESPIRATORY_TRACT
  Filled 2016-04-08: qty 3

## 2016-04-08 MED ORDER — PANTOPRAZOLE SODIUM 40 MG PO TBEC
40.0000 mg | DELAYED_RELEASE_TABLET | Freq: Every day | ORAL | Status: DC
  Administered 2016-04-08: 40 mg via ORAL
  Filled 2016-04-08: qty 1

## 2016-04-08 MED ORDER — POTASSIUM CHLORIDE IN NACL 20-0.9 MEQ/L-% IV SOLN
INTRAVENOUS | Status: DC
  Administered 2016-04-08: 08:00:00 via INTRAVENOUS
  Filled 2016-04-08 (×2): qty 1000

## 2016-04-08 MED ORDER — FLUTICASONE PROPIONATE 50 MCG/ACT NA SUSP: 1.0000 | Freq: Every day | NASAL | Status: DC

## 2016-04-08 MED ORDER — ALBUTEROL SULFATE (2.5 MG/3ML) 0.083% IN NEBU: 2.5000 mg | INHALATION_SOLUTION | RESPIRATORY_TRACT | Status: DC | PRN

## 2016-04-08 MED ORDER — ENOXAPARIN SODIUM 40 MG/0.4ML ~~LOC~~ SOLN
40.0000 mg | SUBCUTANEOUS | Status: DC
  Administered 2016-04-08: 40 mg via SUBCUTANEOUS
  Filled 2016-04-08: qty 0.4

## 2016-04-08 MED ORDER — ALBUTEROL SULFATE (2.5 MG/3ML) 0.083% IN NEBU: 2.5000 mg | INHALATION_SOLUTION | Freq: Four times a day (QID) | RESPIRATORY_TRACT | Status: DC

## 2016-04-08 MED ORDER — PREDNISONE 10 MG (21) PO TBPK: ORAL_TABLET | ORAL | Status: DC

## 2016-04-08 MED ORDER — SODIUM CHLORIDE 0.9% FLUSH
3.0000 mL | Freq: Two times a day (BID) | INTRAVENOUS | Status: DC
  Administered 2016-04-08: 3 mL via INTRAVENOUS

## 2016-04-08 MED ORDER — METHYLPREDNISOLONE SODIUM SUCC 125 MG IJ SOLR
80.0000 mg | Freq: Once | INTRAMUSCULAR | Status: AC
  Administered 2016-04-08: 80 mg via INTRAVENOUS
  Filled 2016-04-08: qty 2

## 2016-04-08 MED ORDER — METHYLPREDNISOLONE SODIUM SUCC 125 MG IJ SOLR
80.0000 mg | Freq: Two times a day (BID) | INTRAMUSCULAR | Status: DC
  Administered 2016-04-08: 80 mg via INTRAVENOUS
  Filled 2016-04-08: qty 1.28

## 2016-04-08 NOTE — H&P (Signed)
[facility] History and Physical  Austin Curry ZOX:[id number] DOB: [birth year] DOA: 04/07/2016  Referring physician: Melene Plan, D.O. PCP: Rogelia Boga, MD   Chief Complaint: Shortness of breath.  HPI: Austin Curry is a 35 y.o. male with a past medical history of asthma, not glucocorticoid dependent and never hospitalized for asthma exacerbation before, who comes to the emergency department with dyspnea, dry cough and wheezing for several days.  Per patient, he has seasonal allergies this time of the year. 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 He explains, that since Monday, when he's been working outside he has been noticing progressively worse renal areola, dry cough, wheezing and dyspnea. He went to [facility] ED 2 days ago, but yesterday his symptoms started recurring again in the morning. He told his supervisor at work, that he may have to leave early, but he was able to finish his work hours like usual. However, later this evening he felt so dyspneic and fatigued that he had to go to [facility] for evaluation and treatment.   In the ER, at [facility], he was given bronchodilators, prednisone, magnesium sulfate sustaining partial relief and was subsequently transferred to this facility for further treatment.    Review of Systems:  Constitutional:  Positive, fatigue. No weight loss, night sweats, Fevers, chills  HEENT:  No headaches, Difficulty swallowing,Tooth/dental problems,Sore throat,  No sneezing, itching, ear ache, nasal congestion, post nasal drip,  Cardio-vascular:  No chest pain, Orthopnea, PND, swelling in lower extremities, anasarca, dizziness, palpitations  GI:  Ventral hernia. No heartburn, indigestion, abdominal pain, nausea, vomiting, diarrhea, change in bowel habits, loss of appetite  Resp:  Positive dry cough, dyspnea and wheezing. No hemoptysis.  Skin:  no rash or lesions.  GU:  no dysuria, change in color of urine, no urgency  or frequency. No flank pain.  Musculoskeletal:  No joint pain or swelling. No decreased range of motion. No back pain.  Psych:  No change in mood or affect. No depression or anxiety. No memory loss.   Past Medical History  Diagnosis Date  . Asthma   . Hernia    History reviewed. No pertinent past surgical history. Social History:  reports that he quit smoking about 15 months ago. His smoking use included Cigarettes. He smoked 0.25 packs per day. He has never used smokeless tobacco. He reports that he drinks alcohol. He reports that he does not use illicit drugs.  No Known Allergies  Family History  Problem Relation Age of Onset  . Diabetes Mother   . Hypertension Mother   . Hyperlipidemia Father     Prior to Admission medications   Medication Sig Start Date End Date Taking? Authorizing Provider  albuterol (PROVENTIL) (2.5 MG/3ML) 0.083% nebulizer solution Take 3 mLs (2.5 mg total) by nebulization every 4 (four) hours as needed for wheezing or shortness of breath. 04/06/16  Yes Rolan Bucco, MD  beclomethasone (QVAR) 80 MCG/ACT inhaler Inhale 1 puff into the lungs 2 (two) times daily. 01/12/16  Yes Nelwyn Salisbury, MD  predniSONE (DELTASONE) 10 MG tablet Take 4 tablets (40 mg total) by mouth daily. 04/06/16  Yes Rolan Bucco, MD   Physical Exam: Filed Vitals:   04/08/16 0106 04/08/16 0134 04/08/16 0200 04/08/16 0429  BP: 121/65 149/83 130/74   Pulse: 81 93 80   Temp: 98.5 F (36.9 C) 98.4 F (36.9 C) 98.4 F (36.9 C)   TempSrc: Oral Oral Oral   Resp: 18 18 18  Height:   6\' 3"  (1.905 m)   Weight:   72.848 kg (160 lb 9.6 oz)   SpO2: 100% 98% 100% 98%    Wt Readings from Last 3 Encounters:  04/08/16 72.848 kg (160 lb 9.6 oz)  04/06/16 78.019 kg (172 lb)  02/16/16 76.658 kg (169 lb)    General:  Appears calm and comfortable Eyes: PERRL, normal lids, irises & conjunctiva ENT: grossly normal hearing, lips & tongue Neck: no LAD, masses or thyromegaly Cardiovascular: RRR,  no m/r/g. No LE edema. Telemetry: SR, no arrhythmias  Respiratory: Decreased breath sounds bilaterally, minimal wheezing, no rales or rhonchi.                      No accessory muscle use. Abdomen: Supraumbilical ventral hernia, soft, ntnd Skin: no rash or induration seen on limited exam Musculoskeletal: grossly normal tone BUE/BLE Psychiatric: grossly normal mood and affect, speech fluent and appropriate Neurologic: Awake, alert, oriented 4, grossly non-focal.          Labs on Admission:  Basic Metabolic Panel:  Recent Labs Lab 04/07/16 2230  NA 141  K 3.7  CL 108  CO2 27  GLUCOSE 86  BUN 16  CREATININE 1.19  CALCIUM 9.1  PHOS 3.4   CBC:  Recent Labs Lab 04/07/16 2230  WBC 8.8  NEUTROABS 4.4  HGB 14.4  HCT 44.1  MCV 92.1  PLT 159    Radiological Exams on Admission: Dg Chest 2 View  04/07/2016  CLINICAL DATA:  Shortness of breath for 2 days EXAM: CHEST  2 VIEW COMPARISON:  September 02, 2015 FINDINGS: There is no edema or consolidation. The heart size and pulmonary vascularity are normal. No adenopathy. No bone lesions. IMPRESSION: No edema or consolidation. Electronically Signed   By: Bretta BangWilliam  Woodruff III M.D.   On: 04/07/2016 23:31     Assessment/Plan Principal Problem:   Asthma exacerbation Admit to telemetry/observation Continue supplemental oxygen. Continue glucocorticoids. Continue bronchodilators. I will add Singulair to his regimen.  Active Problems:   Ventral hernia He is a scheduled for surgery later this month.    Code Status: Full code. DVT Prophylaxis: Lovenox SQ. Family Communication:  Disposition Plan: Admit to treat with bronchodilators, supplemental oxygen and glucocorticoids.  Time spent: About 70 minutes were spent during the process of this admission.  Bobette Moavid Manuel Ortiz, M.D. [facility] Pager [phone].

## 2016-04-08 NOTE — ED Notes (Signed)
Pt transferred to [facility] via [facility] 

## 2016-04-08 NOTE — Discharge Summary (Signed)
Physician Discharge Summary  Austin KirschnerDerek J Pitones QMV:[id number] DOB: 1981-06-07 DOA: 04/07/2016  PCP: Rogelia BogaKWIATKOWSKI,PETER FRANK, MD  Admit date: 04/07/2016 Discharge date: 04/08/2016  Time spent: 35 minutes  Recommendations for Outpatient Follow-up:  1. Patient admitted for asthmatic exacerbation, discharged on prednisone taper   Discharge Diagnoses:  Principal Problem:   Asthma exacerbation Active Problems:   Ventral hernia   Discharge Condition: Stable  Diet recommendation: Regular diet  Filed Weights   04/07/16 2038 04/08/16 0200  Weight: 78.019 kg (172 lb) 72.848 kg (160 lb 9.6 oz)    History of present illness:  Austin Curry is a 35 y.o. male with a past medical history of asthma, not glucocorticoid dependent and never hospitalized for asthma exacerbation before, who comes to the emergency department with dyspnea, dry cough and wheezing for several days.  Per patient, he has seasonal allergies this time of the year. 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 He explains, that since Monday, when he's been working outside he has been noticing progressively worse renal areola, dry cough, wheezing and dyspnea. [facility] ago, but yesterday his symptoms started recurring again in the morning. He told his supervisor at work, that he may have to leave early, but he was able to finish his work hours like usual. However, later this evening he felt so dyspneic and fatigued that he had to go to [facility] for [facility] and treatment.   In the ER[facility], he was given bronchodilators, prednisone, magnesium sulfate sustaining partial relief and was subsequently transferred to this facility for further treatment.   Hospital Course:  Mr. Bascom LevelsFrazier is a pleasant 35 year old gentleman with a past medical history of asthma, presented to the emergency department on 04/07/2016 with complaints of worsening shortness of breath associate with cough and wheezing. He  reported having a history of seasonal allergies with symptoms worsening in the past weeks. 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 He had a recent emergency room visits and was discharged on prednisone however symptoms continued to worsen. He was placed in overnight observation treated with IV steroids and bronchodilators. By the following morning he reported feeling significantly better, was satting 99% on room air having good air movement on lung exam without further wheezes. He was discharged in stable condition on 04/08/2016 on prednisone taper.   Discharge Exam: Filed Vitals:   04/08/16 0200 04/08/16 0645  BP: 130/74 120/59  Pulse: 80 64  Temp: 98.4 F (36.9 C) 97.3 F (36.3 C)  Resp: 18 20    General: Nontoxic appearing, awake and alert Cardiovascular: Regular rate and rhythm normal S1-S2 Respiratory: Good air movement, lungs overall clear to auscultation bilaterally Abdomen: Soft nontender nondistended  Discharge Instructions   Discharge Instructions    Call MD for:  difficulty breathing, headache or visual disturbances    Complete by:  As directed      Call MD for:  extreme fatigue    Complete by:  As directed      Call MD for:  hives    Complete by:  As directed      Call MD for:  persistant dizziness or light-headedness    Complete by:  As directed      Call MD for:  persistant nausea and vomiting    Complete by:  As directed      Call MD for:  redness, tenderness, or signs of infection (pain, swelling, redness, odor or green/yellow  discharge around incision site)    Complete by:  As directed      Call MD for:  severe uncontrolled pain    Complete by:  As directed      Call MD for:  temperature >100.4    Complete by:  As directed      Call MD for:    Complete by:  As directed      Diet - low sodium heart healthy    Complete by:  As directed      Increase activity slowly    Complete by:  As directed           Current Discharge Medication List     START taking these medications   Details  fluticasone (FLONASE) 50 MCG/ACT nasal spray Place 1 spray into both nostrils daily. Qty: 16 g, Refills: 2    predniSONE (STERAPRED UNI-PAK 21 TAB) 10 MG (21) TBPK tablet Take 6-5-4-3-2-1 tablets by mouth daily till gone. Qty: 21 tablet, Refills: 0      CONTINUE these medications which have NOT CHANGED   Details  albuterol (PROVENTIL) (2.5 MG/3ML) 0.083% nebulizer solution Take 3 mLs (2.5 mg total) by nebulization every 4 (four) hours as needed for wheezing or shortness of breath. Qty: 30 vial, Refills: 0    beclomethasone (QVAR) 80 MCG/ACT inhaler Inhale 1 puff into the lungs 2 (two) times daily. Qty: 1 Inhaler, Refills: 11      STOP taking these medications     predniSONE (DELTASONE) 10 MG tablet        No Known Allergies Follow-up Information    Follow up with Rogelia Boga, MD In 1 week.   Specialty:  Internal Medicine   Contact information:   [address] [place] [address] [phone]        The results of significant diagnostics from this hospitalization (including imaging, microbiology, ancillary and laboratory) are listed below for reference.    Significant Diagnostic Studies: Dg Chest 2 View  04/07/2016  CLINICAL DATA:  Shortness of breath for 2 days EXAM: CHEST  2 VIEW COMPARISON:  September 02, 2015 FINDINGS: There is no edema or consolidation. The heart size and pulmonary vascularity are normal. No adenopathy. No bone lesions. IMPRESSION: No edema or consolidation. Electronically Signed   By: Bretta Bang III M.D.   On: 04/07/2016 23:31    Microbiology: No results found for this or any previous visit (from the past 240 hour(s)).   Labs: Basic Metabolic Panel:  Recent Labs Lab 04/07/16 2230  NA 141  K 3.7  CL 108  CO2 27  GLUCOSE 86  BUN 16  CREATININE 1.19  CALCIUM 9.1  PHOS 3.4   Liver Function Tests: No results for input(s): AST, ALT, ALKPHOS, BILITOT, PROT, ALBUMIN  in the last 168 hours. No results for input(s): LIPASE, AMYLASE in the last 168 hours. No results for input(s): AMMONIA in the last 168 hours. CBC:  Recent Labs Lab 04/07/16 2230  WBC 8.8  NEUTROABS 4.4  HGB 14.4  HCT 44.1  MCV 92.1  PLT 159   Cardiac Enzymes: No results for input(s): CKTOTAL, CKMB, CKMBINDEX, TROPONINI in the last 168 hours. BNP: BNP (last 3 results) No results for input(s): BNP in the last 8760 hours.  ProBNP (last 3 results) No results for input(s): PROBNP in the last 8760 hours.  CBG: No results for input(s): GLUCAP in the last 168 hours.     Signed:  Jeralyn Bennett MD.  [facility] 04/08/2016, 11:38  AM    

## 2016-04-08 NOTE — ED Notes (Signed)
Attempt to call report nurse unavailable

## 2016-04-08 NOTE — Progress Notes (Signed)
Patient ID: Austin KirschnerDerek J Coote, male   DOB: 03-Dec-1981, 35 y.o.   MRN: [id number] [facility] after only getting partial relief with bronchodilators and prednisone.    Per Dr. Melene Planan Floyd  HPI Comments:  Austin Curry is a 35 y.o. male with PMHx of asthma who presents to the Emergency Department complaining of constant, mild SOB onset 2 days ago. Patient reports that he was seen yesterday by [facility]. Fredderick PhenixBelfi at [facility]  for the same complaint where he was prescribed an albuterol inhaler and prednisone. 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 He has used the albuterol inhaler and taken prednisone without relief. Denies wheezing or any other symptoms.      04/12 0700 04/13 0659 04/13 0700 04/14 0038   Most Recent       Temp (F)   98.1  98.1 (36.7)  04/13 2038   Pulse Rate      69-70  70  04/13 2247   Resp      18  18  04/13 2247   BP      123/73-137/75  137/75  04/13 2247   SpO2 (%)      94-100  100  04/13 2341   Weight (kg)   78.019  78.019 kg (172 lb)  04/13 2038             Component Value Units   Basic metabolic panel [[id number]][[id number]] Collected: 04/07/16 2230   Updated: 04/07/16 2251    Specimen Type: Blood    Specimen Source: Vein     Sodium 141 mmol/L    Potassium 3.7 mmol/L    Chloride 108 mmol/L    CO2 27 mmol/L    Glucose, Bld 86 mg/dL    BUN 16 mg/dL    Creatinine, Ser 7.821.19 mg/dL    Calcium 9.1 mg/dL    GFR calc non Af Amer >60 mL/min    GFR calc Af Amer >60 mL/min    Anion gap 6   CBC with Differential [[id number]][[id number]] Collected: 04/07/16 2230   Updated: 04/07/16 2241    Specimen Type: Blood    Specimen Source: Vein     WBC 8.8 K/uL    RBC 4.79 MIL/uL    Hemoglobin 14.4 g/dL    HCT 57.844.1 %    MCV 46.992.1 fL    MCH 30.1 pg    MCHC 32.7 g/dL    RDW 62.913.6 %    Platelets 159 K/uL    Neutrophils Relative % 49 %    Neutro Abs 4.4  K/uL    Lymphocytes Relative 37 %    Lymphs Abs 3.3 K/uL    Monocytes Relative 8 %    Monocytes Absolute 0.7 K/uL    Eosinophils Relative 5 %    Eosinophils Absolute 0.4 K/uL    Basophils Relative 1 %    Basophils Absolute 0.0 K/uL    CLINICAL DATA: Shortness of breath for 2 days  EXAM: CHEST 2 VIEW  COMPARISON: September 02, 2015  FINDINGS: There is no edema or consolidation. The heart size and pulmonary vascularity are normal. No adenopathy. No bone lesions.  IMPRESSION: No edema or consolidation.   Accepted to a [facility] at [facility].  Sanda Kleinavid Ortiz, M.D.

## 2016-04-09 NOTE — Telephone Encounter (Signed)
FYI, pt went to ED and was admitted.

## 2016-04-12 ENCOUNTER — Ambulatory Visit (INDEPENDENT_AMBULATORY_CARE_PROVIDER_SITE_OTHER): Payer: Self-pay | Admitting: Internal Medicine

## 2016-04-12 ENCOUNTER — Encounter: Payer: Self-pay | Admitting: Internal Medicine

## 2016-04-12 VITALS — BP 120/70 | HR 79 | Temp 99.1°F | Resp 20 | Ht 75.0 in | Wt 170.0 lb

## 2016-04-12 DIAGNOSIS — J4521 Mild intermittent asthma with (acute) exacerbation: Secondary | ICD-10-CM

## 2016-04-12 DIAGNOSIS — J45901 Unspecified asthma with (acute) exacerbation: Secondary | ICD-10-CM

## 2016-04-12 DIAGNOSIS — K439 Ventral hernia without obstruction or gangrene: Secondary | ICD-10-CM

## 2016-04-12 NOTE — Progress Notes (Signed)
Subjective:    Patient ID: Austin Curry, male    DOB: [birth year], 35 y.o.   MRN: [id number]  HPI Admit date: 04/07/2016 Discharge date: 04/08/2016  Recommendations for Outpatient Follow-up:  1. Patient admitted for asthmatic exacerbation, discharged on prednisone taper   Discharge Diagnoses:  Principal Problem:  Asthma exacerbation Active Problems:  Ventral hernia    35 year old patient who is seen today following a recent hospital discharge for asthma exacerbation.  Since his discharge.  4 days ago.  He has done well.  He is completing a prednisone taper.  No wheezing.   He has a history of mild asthma and has been on Qvar maintenance, which he has taken inconsistently.    He has rescue albuterol both MDI and nebulizer treatment at home.  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   During his recent hospital admission, it was suggested he be seen by pulmonary/, allergy medicine and he is agreeable.   He is scheduled for general surgery.  Preoperative evaluation later this week for consideration of elective ventral hernia repair next week.  Hospital records reviewed.  Past Medical History  Diagnosis Date  . Asthma   . Hernia      Social History   Social History  . Marital Status: Single    Spouse Name: N/A  . Number of Children: N/A  . Years of Education: N/A   Occupational History  . Not on file.   Social History Main Topics  . Smoking status: Former Smoker -- 0.25 packs/day    Types: Cigarettes    Quit date: 12/09/2014  . Smokeless tobacco: Never Used  . Alcohol Use: 0.0 oz/week    0 Standard drinks or equivalent per week     Comment: 1 drink every other month or so, socially  . Drug Use: No     Comment: Stopped Marjuiana 03/24/2014  . Sexual Activity: Not on file   Other Topics Concern  . Not on file   Social History Narrative    No past surgical history on file.  Family History  Problem Relation Age  of Onset  . Diabetes Mother   . Hypertension Mother   . Hyperlipidemia Father     No Known Allergies  Current Outpatient Prescriptions on File Prior to Visit  Medication Sig Dispense Refill  . albuterol (PROVENTIL) (2.5 MG/3ML) 0.083% nebulizer solution Take 3 mLs (2.5 mg total) by nebulization every 4 (four) hours as needed for wheezing or shortness of breath. 30 vial 0  . beclomethasone (QVAR) 80 MCG/ACT inhaler Inhale 1 puff into the lungs 2 (two) times daily. 1 Inhaler 11  . fluticasone (FLONASE) 50 MCG/ACT nasal spray Place 1 spray into both nostrils daily. 16 g 2  . predniSONE (STERAPRED UNI-PAK 21 TAB) 10 MG (21) TBPK tablet Take 6-5-4-3-2-1 tablets by mouth daily till gone. 21 tablet 0   No current facility-administered medications on file prior to visit.    BP 120/70 mmHg  Pulse 79  Temp(Src) 99.1 F (37.3 C) (Oral)  Resp 20  Ht  (1.905 m)  Wt 170 lb (77.111 kg)  BMI 21.25 kg/m2  SpO2 98%       Review of Systems  Constitutional: Negative for fever, chills, appetite change and fatigue.  HENT: Negative for congestion, dental problem, ear pain, hearing loss, sore throat, tinnitus, trouble swallowing and voice change.   Eyes: Negative for pain, discharge and visual disturbance.  Respiratory: Positive for  shortness of breath and wheezing. Negative for cough, chest tightness and stridor.   Cardiovascular: Negative for chest pain, palpitations and leg swelling.  Gastrointestinal: Negative for nausea, vomiting, abdominal pain, diarrhea, constipation, blood in stool and abdominal distention.  Genitourinary: Negative for urgency, hematuria, flank pain, discharge, difficulty urinating and genital sores.  Musculoskeletal: Negative for myalgias, back pain, joint swelling, arthralgias, gait problem and neck stiffness.  Skin: Negative for rash.  Neurological: Negative for dizziness, syncope, speech difficulty, weakness, numbness and headaches.  Hematological: Negative for  adenopathy. Does not bruise/bleed easily.  Psychiatric/Behavioral: Negative for behavioral problems and dysphoric mood. The patient is not nervous/anxious.        Objective:   Physical Exam  Constitutional: He is oriented to person, place, and time. He appears well-developed.  HENT:  Head: Normocephalic.  Right Ear: External ear normal.  Left Ear: External ear normal.  Eyes: Conjunctivae and EOM are normal.  Neck: Normal range of motion.  Cardiovascular: Normal rate and normal heart sounds.   Pulmonary/Chest: Effort normal and breath sounds normal. No respiratory distress. He has no wheezes. He has no rales.  O2 saturation 98  Abdominal: Bowel sounds are normal.  Musculoskeletal: Normal range of motion. He exhibits no edema or tenderness.  Neurological: He is alert and oriented to person, place, and time.  Psychiatric: He has a normal mood and affect. His behavior is normal.          Assessment & Plan:   Stable status post recent asthma exacerbation.  Will complete present.  Prednisone taper and continue on maintenance Qvar.  We'll continue when necessary rescue albuterol.  Schedule pulmonary/allergy follow-up  Symptomatic ventral hernia.  Patient is to have preoperative evaluation later this week.  Letter for leave of absence from work.  Dictated

## 2016-04-12 NOTE — Patient Instructions (Addendum)
Completed prednisone Dosepak and then discontinue  Continue Qvar as directed  Pulmonary/allergy follow-up as scheduled  Return in 6 months for follow-up Asthma Attack Prevention While you may not be able to control the fact that you have asthma, you can take actions to prevent asthma attacks. The best way to prevent asthma attacks is to maintain good control of your asthma. You can achieve this by:  Taking your medicines as directed.  Avoiding things that can irritate your airways or make your asthma symptoms worse (asthma triggers).  Keeping track of how well your asthma is controlled and of any changes in your symptoms.  Responding quickly to worsening asthma symptoms (asthma attack).  Seeking emergency care when it is needed. WHAT ARE SOME WAYS TO PREVENT AN ASTHMA ATTACK? Have a Plan Work with your health care provider to create a written plan for managing and treating your asthma attacks (asthma action plan). This plan includes:  A list of your asthma triggers and how you can avoid them.  Information on when medicines should be taken and when their dosages should be changed.  The use of a device that measures how well your lungs are working (peak flow meter). Monitor Your Asthma Use your peak flow meter and record your results in a journal every day. A drop in your peak flow numbers on one or more days may indicate the start of an asthma attack. This can happen even before you start to feel symptoms. You can prevent an asthma attack from getting worse by following the steps in your asthma action plan. Avoid Asthma Triggers Work with your asthma health care provider to find out what your asthma triggers are. This can be done by:  Allergy testing.  Keeping a journal that notes when asthma attacks occur and the factors that may have contributed to them.  Determining if there are other medical conditions that are making your asthma worse. Once you have determined your asthma  triggers, take steps to avoid them. This may include avoiding excessive or prolonged exposure to:  Dust. Have someone dust and vacuum your home for you once or twice a week. Using a high-efficiency particulate arrestance (HEPA) vacuum is best.  Smoke. This includes campfire smoke, forest fire smoke, and secondhand smoke from tobacco products.  Pet dander. Avoid contact with animals that you know you are allergic to.  Allergens from trees, grasses or pollens. Avoid spending a lot of time outdoors when pollen counts are high, and on very windy days.  Very cold, dry, or humid air.  Mold.  Foods that contain high amounts of sulfites.  Strong odors.  Outdoor air pollutants, such as Museum/gallery exhibitions officer.  Indoor air pollutants, such as aerosol sprays and fumes from household cleaners.  Household pests, including dust mites and cockroaches, and pest droppings.  Certain medicines, including NSAIDs. Always talk to your health care provider before stopping or starting any new medicines. Medicines Take over-the-counter and prescription medicines only as told by your health care provider. Many asthma attacks can be prevented by carefully following your medicine schedule. Taking your medicines correctly is especially important when you cannot avoid certain asthma triggers. Act Quickly If an asthma attack does happen, acting quickly can decrease how severe it is and how long it lasts. Take these steps:   Pay attention to your symptoms. If you are coughing, wheezing, or having difficulty breathing, do not wait to see if your symptoms go away on their own. Follow your asthma action plan.  If  you have followed your asthma action plan and your symptoms are not improving, call your health care provider or seek immediate medical care at the nearest hospital. It is important to note how often you need to use your fast-acting rescue inhaler. If you are using your rescue inhaler more often, it may mean that your  asthma is not under control. Adjusting your asthma treatment plan may help you to prevent future asthma attacks and help you to gain better control of your condition. HOW CAN I PREVENT AN ASTHMA ATTACK WHEN I EXERCISE? Follow advice from your health care provider about whether you should use your fast-acting inhaler before exercising. Many people with asthma experience exercise-induced bronchoconstriction (EIB). This condition often worsens during vigorous exercise in cold, humid, or dry environments. Usually, people with EIB can stay very active by pre-treating with a fast-acting inhaler before exercising.   This information is not intended to replace advice given to you by your health care provider. Make sure you discuss any questions you have with your health care provider.   Document Released: 11/30/2009 Document Revised: 09/02/2015 Document Reviewed: 05/14/2015 Elsevier Interactive Patient Education 2016 Elsevier Inc. Asthma, Adult Asthma is a recurring condition in which the airways tighten and narrow. Asthma can make it difficult to breathe. It can cause coughing, wheezing, and shortness of breath. Asthma episodes, also called asthma attacks, range from minor to life-threatening. Asthma cannot be cured, but medicines and lifestyle changes can help control it. CAUSES Asthma is believed to be caused by inherited (genetic) and environmental factors, but its exact cause is unknown. Asthma may be triggered by allergens, lung infections, or irritants in the air. Asthma triggers are different for each person. Common triggers include:   Animal dander.  Dust mites.  Cockroaches.  Pollen from trees or grass.  Mold.  Smoke.  Air pollutants such as dust, household cleaners, hair sprays, aerosol sprays, paint fumes, strong chemicals, or strong odors.  Cold air, weather changes, and winds (which increase molds and pollens in the air).  Strong emotional expressions such as crying or laughing  hard.  Stress.  Certain medicines (such as aspirin) or types of drugs (such as beta-blockers).  Sulfites in foods and drinks. Foods and drinks that may contain sulfites include dried fruit, potato chips, and sparkling grape juice.  Infections or inflammatory conditions such as the flu, a cold, or an inflammation of the nasal membranes (rhinitis).  Gastroesophageal reflux disease (GERD).  Exercise or strenuous activity. SYMPTOMS Symptoms may occur immediately after asthma is triggered or many hours later. Symptoms include:  Wheezing.  Excessive nighttime or early morning coughing.  Frequent or severe coughing with a common cold.  Chest tightness.  Shortness of breath. DIAGNOSIS  The diagnosis of asthma is made by a review of your medical history and a physical exam. Tests may also be performed. These may include:  Lung function studies. These tests show how much air you breathe in and out.  Allergy tests.  Imaging tests such as X-rays. TREATMENT  Asthma cannot be cured, but it can usually be controlled. Treatment involves identifying and avoiding your asthma triggers. It also involves medicines. There are 2 classes of medicine used for asthma treatment:   Controller medicines. These prevent asthma symptoms from occurring. They are usually taken every day.  Reliever or rescue medicines. These quickly relieve asthma symptoms. They are used as needed and provide short-term relief. Your health care provider will help you create an asthma action plan. An asthma action plan  is a written plan for managing and treating your asthma attacks. It includes a list of your asthma triggers and how they may be avoided. It also includes information on when medicines should be taken and when their dosage should be changed. An action plan may also involve the use of a device called a peak flow meter. A peak flow meter measures how well the lungs are working. It helps you monitor your  condition. HOME CARE INSTRUCTIONS   Take medicines only as directed by your health care provider. Speak with your health care provider if you have questions about how or when to take the medicines.  Use a peak flow meter as directed by your health care provider. Record and keep track of readings.  Understand and use the action plan to help minimize or stop an asthma attack without needing to seek medical care.  Control your home environment in the following ways to help prevent asthma attacks:  Do not smoke. Avoid being exposed to secondhand smoke.  Change your heating and air conditioning filter regularly.  Limit your use of fireplaces and wood stoves.  Get rid of pests (such as roaches and mice) and their droppings.  Throw away plants if you see mold on them.  Clean your floors and dust regularly. Use unscented cleaning products.  Try to have someone else vacuum for you regularly. Stay out of rooms while they are being vacuumed and for a short while afterward. If you vacuum, use a dust mask from a hardware store, a double-layered or microfilter vacuum cleaner bag, or a vacuum cleaner with a HEPA filter.  Replace carpet with wood, tile, or vinyl flooring. Carpet can trap dander and dust.  Use allergy-proof pillows, mattress covers, and box spring covers.  Wash bed sheets and blankets every week in hot water and dry them in a dryer.  Use blankets that are made of polyester or cotton.  Clean bathrooms and kitchens with bleach. If possible, have someone repaint the walls in these rooms with mold-resistant paint. Keep out of the rooms that are being cleaned and painted.  Wash hands frequently. SEEK MEDICAL CARE IF:   You have wheezing, shortness of breath, or a cough even if taking medicine to prevent attacks.  The colored mucus you cough up (sputum) is thicker than usual.  Your sputum changes from clear or white to yellow, green, gray, or bloody.  You have any problems that  may be related to the medicines you are taking (such as a rash, itching, swelling, or trouble breathing).  You are using a reliever medicine more than 2-3 times per week.  Your peak flow is still at 50-79% of your personal best after following your action plan for 1 hour.  You have a fever. SEEK IMMEDIATE MEDICAL CARE IF:   You seem to be getting worse and are unresponsive to treatment during an asthma attack.  You are short of breath even at rest.  You get short of breath when doing very little physical activity.  You have difficulty eating, drinking, or talking due to asthma symptoms.  You develop chest pain.  You develop a fast heartbeat.  You have a bluish color to your lips or fingernails.  You are light-headed, dizzy, or faint.  Your peak flow is less than 50% of your personal best.   This information is not intended to replace advice given to you by your health care provider. Make sure you discuss any questions you have with your health care  provider.   Document Released: 12/12/2005 Document Revised: 09/02/2015 Document Reviewed: 07/11/2013 Elsevier Interactive Patient Education [employer]2016 Elsevier Inc.

## 2016-04-12 NOTE — Progress Notes (Signed)
Pre visit review using our clinic review tool, if applicable. No additional management support is needed unless otherwise documented below in the visit note. 

## 2016-04-13 NOTE — Patient Instructions (Addendum)
YOUR PROCEDURE IS SCHEDULED ON :  04/18/16  REPORT TO [facility] MAIN ENTRANCE FOLLOW SIGNS TO EAST ELEVATOR - GO TO 3rd FLOOR CHECK IN AT 3 EAST NURSES STATION (SHORT STAY) AT:  5:30 AM  CALL THIS NUMBER IF YOU HAVE PROBLEMS THE MORNING OF SURGERY [phone]  REMEMBER:ONLY 1 PER PERSON MAY GO TO SHORT STAY WITH YOU TO GET READY THE MORNING OF YOUR SURGERY  DO NOT EAT FOOD OR DRINK LIQUIDS AFTER MIDNIGHT  TAKE THESE MEDICINES THE MORNING OF SURGERY:  USE NEBULIZER / USE QVAR INHALER   STOP ASPIRIN / IBUPROFEN / ALEVE / VITAMINS / HERBAL MEDS __5__ DAYS BEFORE SURGERY  YOU MAY NOT HAVE ANY METAL ON YOUR BODY INCLUDING HAIR PINS AND PIERCING'S. DO NOT WEAR JEWELRY, MAKEUP, LOTIONS, POWDERS OR PERFUMES. DO NOT WEAR NAIL POLISH. DO NOT SHAVE 48 HRS PRIOR TO SURGERY. MEN MAY SHAVE FACE AND NECK.  DO NOT BRING VALUABLES TO HOSPITAL. [facility] IS NOT RESPONSIBLE FOR VALUABLES.  CONTACTS, DENTURES OR PARTIALS MAY NOT BE WORN TO SURGERY. LEAVE SUITCASE IN CAR. CAN BE BROUGHT TO ROOM AFTER SURGERY.  PATIENTS DISCHARGED THE DAY OF SURGERY WILL NOT BE ALLOWED TO DRIVE HOME.  PLEASE READ OVER THE FOLLOWING INSTRUCTION SHEETS _________________________________________________________________________________                                          [facility] - PREPARING FOR SURGERY  Before surgery, you can play an important role.  Because skin is not sterile, your skin needs to be as free of germs as possible.  You can reduce the number of germs on your skin by washing with CHG (chlorahexidine gluconate) soap before surgery.  CHG is an antiseptic cleaner which kills germs and bonds with the skin to continue killing germs even after washing. Please DO NOT use if you have an allergy to CHG or antibacterial soaps.  If your skin becomes reddened/irritated stop using the CHG and inform your nurse when you arrive at Short Stay. Do not shave (including legs and underarms) for at  least 48 hours prior to the first CHG shower.  You may shave your face. Please follow these instructions carefully:   1.  Shower with CHG Soap the night before surgery and the  morning of Surgery.   2.  If you choose to wash your hair, wash your hair first as usual with your  normal  Shampoo.   3.  After you shampoo, rinse your hair and body thoroughly to remove the  shampoo.                                         4.  Use CHG as you would any other liquid soap.  You can apply chg directly  to the skin and wash . Gently wash with scrungie or clean wascloth    5.  Apply the CHG Soap to your body ONLY FROM THE NECK DOWN.   Do not use on open                           Wound or open sores. Avoid contact with eyes, ears mouth and genitals (private parts).  Genitals (private parts) with your normal soap.              6.  Wash thoroughly, paying special attention to the area where your surgery  will be performed.   7.  Thoroughly rinse your body with warm water from the neck down.   8.  DO NOT shower/wash with your normal soap after using and rinsing off  the CHG Soap .                9.  Pat yourself dry with a clean towel.             10.  Wear clean night clothes to bed after shower             11.  Place clean sheets on your bed the night of your first shower and do not  sleep with pets.  Day of Surgery : Do not apply any lotions/deodorants the morning of surgery.  Please wear clean clothes to the hospital/surgery center.  FAILURE TO FOLLOW THESE INSTRUCTIONS MAY RESULT IN THE CANCELLATION OF YOUR SURGERY    PATIENT SIGNATURE_________________________________  ______________________________________________________________________

## 2016-04-14 ENCOUNTER — Encounter (HOSPITAL_COMMUNITY)
Admission: RE | Admit: 2016-04-14 | Discharge: 2016-04-14 | Disposition: A | Discharge status: Home / Self Care | Payer: Self-pay | Source: Ambulatory Visit | Attending: Surgery | Admitting: Surgery

## 2016-04-14 ENCOUNTER — Encounter (HOSPITAL_COMMUNITY): Payer: Self-pay

## 2016-04-14 DIAGNOSIS — K439 Ventral hernia without obstruction or gangrene: Secondary | ICD-10-CM | POA: Insufficient documentation

## 2016-04-17 NOTE — H&P (Signed)
Austin Curry  [facility] Patient #: [id number] DOB: [birth year] Single / Language: [place] / Race: Black or African American Male   History of Present Illness Patient words: New-ventral hernia.  The patient is a 35 year old male who presents with an incisional hernia. This is a pleasant gentleman referred by Dr. Gershon CraneStephen Fry for evaluation of a symptomatic ventral hernia. The patient has had a small hernia for many years but is now become incarcerated with omentum and is causing discomfort. He describes a moderate sharp intermittent pain above the umbilicus. He has no nausea, vomiting, or obstructive symptoms. He is otherwise healthy   Other Problems Asthma Umbilical Hernia Repair  Diagnostic Studies History Colonoscopy never  Allergies  No Known Drug Allergies  Medication History Albuterol (90MCG/ACT Aerosol Soln, Inhalation) Active. Qvar (80MCG/ACT Aerosol Soln, Inhalation) Active. Medications Reconciled  Social History Alcohol use Occasional alcohol use. Illicit drug use Remotely quit drug use. No caffeine use Tobacco use Current some day smoker.  Family History ( Cerebrovascular Accident Mother. Diabetes Mellitus Brother, Mother. Hypertension Mother.    Review of Systems  General Not Present- Appetite Loss, Chills, Fatigue, Fever, Night Sweats, Weight Gain and Weight Loss. Skin Not Present- Change in Wart/Mole, Dryness, Hives, Jaundice, New Lesions, Non-Healing Wounds, Rash and Ulcer. HEENT Not Present- Earache, Hearing Loss, Hoarseness, Nose Bleed, Oral Ulcers, Ringing in the Ears, Seasonal Allergies, Sinus Pain, Sore Throat, Visual Disturbances, Wears glasses/contact lenses and Yellow Eyes. Breast Not Present- Breast Mass, Breast Pain, Nipple Discharge and Skin Changes. Cardiovascular Not Present- Chest Pain, Difficulty Breathing Lying Down, Leg Cramps, Palpitations, Rapid Heart Rate, Shortness of Breath and Swelling of  Extremities. Gastrointestinal Present- Abdominal Pain. Not Present- Bloating, Bloody Stool, Change in Bowel Habits, Chronic diarrhea, Constipation, Difficulty Swallowing, Excessive gas, Gets full quickly at meals, Hemorrhoids, Indigestion, Nausea, Rectal Pain and Vomiting. Male Genitourinary Not Present- Blood in Urine, Change in Urinary Stream, Frequency, Impotence, Nocturia, Painful Urination, Urgency and Urine Leakage. Musculoskeletal Not Present- Back Pain, Joint Pain, Joint Stiffness, Muscle Pain, Muscle Weakness and Swelling of Extremities. Neurological Not Present- Decreased Memory, Fainting, Headaches, Numbness, Seizures, Tingling, Tremor, Trouble walking and Weakness. Psychiatric Not Present- Anxiety, Bipolar, Change in Sleep Pattern, Depression, Fearful and Frequent crying. Endocrine Not Present- Cold Intolerance, Excessive Hunger, Hair Changes, Heat Intolerance, Hot flashes and New Diabetes. Hematology Not Present- Easy Bruising, Excessive bleeding, Gland problems, HIV and Persistent Infections.  Vitals   Weight: 172.4 lb Height: 75in Body Surface Area: 2.06 m Body Mass Index: 21.55 kg/m  Temp.: 98.37F(Oral)  Pulse: 72 (Regular)  BP: 110/80 (Sitting, Left Arm, Standard)    Physical Exam  General Mental Status-Alert. General Appearance-Consistent with stated age. Hydration-Well hydrated. Voice-Normal.  Head and Neck Head-normocephalic, atraumatic with no lesions or palpable masses. Trachea-midline.  Eye Eyeball - Bilateral-Extraocular movements intact. Sclera/Conjunctiva - Bilateral-No scleral icterus.  Chest and Lung Exam Chest and lung exam reveals -quiet, even and easy respiratory effort with no use of accessory muscles and on auscultation, normal breath sounds, no adventitious sounds and normal vocal resonance. Inspection Chest Wall - Normal. Back - normal.  Cardiovascular Cardiovascular examination reveals -normal heart sounds,  regular rate and rhythm with no murmurs and normal pedal pulses bilaterally.  Abdomen Inspection Skin - Scar - no surgical scars. Hernias - Ventral - Incarcerated. Note: There is a small incarcerated hernia several inches above the umbilicus at the midline. His abdomen is nontender otherwise and I believe the hernia contains omentum. Palpation/Percussion Palpation and Percussion of the  abdomen reveal - Soft, Non Tender, No Rebound tenderness, No Rigidity (guarding) and No hepatosplenomegaly. Auscultation Auscultation of the abdomen reveals - Bowel sounds normal.  Neurologic Neurologic evaluation reveals -alert and oriented x 3 with no impairment of recent or remote memory. Mental Status-Normal.  Musculoskeletal Normal Exam - Left-Upper Extremity Strength Normal and Lower Extremity Strength Normal. Normal Exam - Right-Upper Extremity Strength Normal, Lower Extremity Weakness.    Assessment & Plan   VENTRAL HERNIA (K43.9)  Impression: I discussed the diagnosis with the patient and his wife. I discussed ventral hernia repair with mesh. I discussed the surgical procedure in detail. I discussed the risks of surgery which includes but is not limited to bleeding, infection, injury to his running structures, recurrence, use of mesh, postoperative recovery, etc. He understands and wished to proceed with surgery

## 2016-04-18 ENCOUNTER — Ambulatory Visit (HOSPITAL_COMMUNITY)
Admission: RE | Admit: 2016-04-18 | Discharge: 2016-04-18 | Disposition: A | Discharge status: Home / Self Care | Payer: Self-pay | Source: Ambulatory Visit | Attending: Surgery | Admitting: Surgery

## 2016-04-18 ENCOUNTER — Encounter (HOSPITAL_COMMUNITY)
Admission: RE | Disposition: A | Discharge status: Home / Self Care | Payer: Self-pay | Source: Ambulatory Visit | Attending: Surgery

## 2016-04-18 ENCOUNTER — Ambulatory Visit (HOSPITAL_COMMUNITY): Payer: Self-pay | Admitting: Anesthesiology

## 2016-04-18 ENCOUNTER — Encounter (HOSPITAL_COMMUNITY): Payer: Self-pay | Admitting: Anesthesiology

## 2016-04-18 DIAGNOSIS — K430 Incisional hernia with obstruction, without gangrene: Secondary | ICD-10-CM | POA: Insufficient documentation

## 2016-04-18 DIAGNOSIS — J45909 Unspecified asthma, uncomplicated: Secondary | ICD-10-CM | POA: Insufficient documentation

## 2016-04-18 DIAGNOSIS — Z79899 Other long term (current) drug therapy: Secondary | ICD-10-CM | POA: Insufficient documentation

## 2016-04-18 DIAGNOSIS — Z87891 Personal history of nicotine dependence: Secondary | ICD-10-CM | POA: Insufficient documentation

## 2016-04-18 HISTORY — PX: INSERTION OF MESH: SHX5868

## 2016-04-18 HISTORY — PX: VENTRAL HERNIA REPAIR: SHX424

## 2016-04-18 SURGERY — REPAIR, HERNIA, VENTRAL
Anesthesia: General | Site: Abdomen

## 2016-04-18 MED ORDER — SODIUM CHLORIDE 0.9% FLUSH: 3.0000 mL | INTRAVENOUS | Status: DC | PRN

## 2016-04-18 MED ORDER — PHENYLEPHRINE 40 MCG/ML (10ML) SYRINGE FOR IV PUSH (FOR BLOOD PRESSURE SUPPORT)
PREFILLED_SYRINGE | INTRAVENOUS | Status: AC
  Filled 2016-04-18: qty 30

## 2016-04-18 MED ORDER — HYDROCODONE-ACETAMINOPHEN 5-325 MG PO TABS: 1.0000 | ORAL_TABLET | ORAL | Status: DC | PRN

## 2016-04-18 MED ORDER — LIDOCAINE HCL (CARDIAC) 20 MG/ML IV SOLN
INTRAVENOUS | Status: AC
  Filled 2016-04-18: qty 5

## 2016-04-18 MED ORDER — ONDANSETRON HCL 4 MG/2ML IJ SOLN
INTRAMUSCULAR | Status: AC
  Filled 2016-04-18: qty 2

## 2016-04-18 MED ORDER — FENTANYL CITRATE (PF) 250 MCG/5ML IJ SOLN
INTRAMUSCULAR | Status: AC
  Filled 2016-04-18: qty 5

## 2016-04-18 MED ORDER — ATROPINE SULFATE 0.4 MG/ML IJ SOLN
INTRAMUSCULAR | Status: AC
  Filled 2016-04-18: qty 2

## 2016-04-18 MED ORDER — DEXAMETHASONE SODIUM PHOSPHATE 10 MG/ML IJ SOLN
INTRAMUSCULAR | Status: DC | PRN
  Administered 2016-04-18: 10 mg via INTRAVENOUS

## 2016-04-18 MED ORDER — SODIUM CHLORIDE 0.9 % IV SOLN: 250.0000 mL | INTRAVENOUS | Status: DC | PRN

## 2016-04-18 MED ORDER — BUPIVACAINE HCL (PF) 0.5 % IJ SOLN
INTRAMUSCULAR | Status: AC
  Filled 2016-04-18: qty 30

## 2016-04-18 MED ORDER — ACETAMINOPHEN 650 MG RE SUPP
650.0000 mg | RECTAL | Status: DC | PRN
  Filled 2016-04-18: qty 1

## 2016-04-18 MED ORDER — LIDOCAINE HCL 4 % EX SOLN
CUTANEOUS | Status: DC | PRN
  Administered 2016-04-18: 4 mL via TOPICAL

## 2016-04-18 MED ORDER — OXYCODONE HCL 5 MG PO TABS
5.0000 mg | ORAL_TABLET | ORAL | Status: DC | PRN
  Administered 2016-04-18 (×2): 5 mg via ORAL
  Filled 2016-04-18 (×2): qty 1

## 2016-04-18 MED ORDER — ONDANSETRON HCL 4 MG/2ML IJ SOLN
INTRAMUSCULAR | Status: DC | PRN
Start: 2016-04-18 — End: 2016-04-18
  Administered 2016-04-18: 4 mg via INTRAVENOUS

## 2016-04-18 MED ORDER — SUGAMMADEX SODIUM 200 MG/2ML IV SOLN
INTRAVENOUS | Status: DC | PRN
  Administered 2016-04-18: 200 mg via INTRAVENOUS

## 2016-04-18 MED ORDER — PROPOFOL 10 MG/ML IV BOLUS
INTRAVENOUS | Status: AC
Start: 2016-04-18 — End: 2016-04-18
  Filled 2016-04-18: qty 40

## 2016-04-18 MED ORDER — SUGAMMADEX SODIUM 200 MG/2ML IV SOLN
INTRAVENOUS | Status: AC
  Filled 2016-04-18: qty 2

## 2016-04-18 MED ORDER — MIDAZOLAM HCL 5 MG/5ML IJ SOLN
INTRAMUSCULAR | Status: DC | PRN
Start: 2016-04-18 — End: 2016-04-18
  Administered 2016-04-18: 2 mg via INTRAVENOUS

## 2016-04-18 MED ORDER — KETOROLAC TROMETHAMINE 30 MG/ML IJ SOLN
30.0000 mg | Freq: Once | INTRAMUSCULAR | Status: DC
  Filled 2016-04-18: qty 1

## 2016-04-18 MED ORDER — PROPOFOL 10 MG/ML IV BOLUS
INTRAVENOUS | Status: DC | PRN
  Administered 2016-04-18: 200 mg via INTRAVENOUS

## 2016-04-18 MED ORDER — 0.9 % SODIUM CHLORIDE (POUR BTL) OPTIME
TOPICAL | Status: DC | PRN
  Administered 2016-04-18: 1000 mL

## 2016-04-18 MED ORDER — DEXAMETHASONE SODIUM PHOSPHATE 10 MG/ML IJ SOLN
INTRAMUSCULAR | Status: AC
  Filled 2016-04-18: qty 1

## 2016-04-18 MED ORDER — MORPHINE SULFATE (PF) 10 MG/ML IV SOLN: 1.0000 mg | INTRAVENOUS | Status: DC | PRN

## 2016-04-18 MED ORDER — BUPIVACAINE HCL (PF) 0.5 % IJ SOLN
INTRAMUSCULAR | Status: DC | PRN
  Administered 2016-04-18: 20 mL

## 2016-04-18 MED ORDER — LIDOCAINE HCL (CARDIAC) 20 MG/ML IV SOLN
INTRAVENOUS | Status: DC | PRN
  Administered 2016-04-18: 50 mg via INTRAVENOUS

## 2016-04-18 MED ORDER — LACTATED RINGERS IV SOLN
INTRAVENOUS | Status: DC | PRN
  Administered 2016-04-18: 07:00:00 via INTRAVENOUS

## 2016-04-18 MED ORDER — GLYCOPYRROLATE 0.2 MG/ML IJ SOLN
INTRAMUSCULAR | Status: AC
  Filled 2016-04-18: qty 1

## 2016-04-18 MED ORDER — HYDROMORPHONE HCL 1 MG/ML IJ SOLN
INTRAMUSCULAR | Status: AC
  Filled 2016-04-18: qty 1

## 2016-04-18 MED ORDER — MEPERIDINE HCL 50 MG/ML IJ SOLN: 6.2500 mg | INTRAMUSCULAR | Status: DC | PRN

## 2016-04-18 MED ORDER — ROCURONIUM BROMIDE 100 MG/10ML IV SOLN
INTRAVENOUS | Status: AC
  Filled 2016-04-18: qty 1

## 2016-04-18 MED ORDER — DEXTROSE 5 % IV SOLN
2.0000 g | INTRAVENOUS | Status: AC
  Administered 2016-04-18: 2 g via INTRAVENOUS
  Filled 2016-04-18: qty 20

## 2016-04-18 MED ORDER — EPHEDRINE SULFATE 50 MG/ML IJ SOLN
INTRAMUSCULAR | Status: AC
  Filled 2016-04-18: qty 1

## 2016-04-18 MED ORDER — ONDANSETRON HCL 4 MG/2ML IJ SOLN: 4.0000 mg | Freq: Once | INTRAMUSCULAR | Status: DC | PRN

## 2016-04-18 MED ORDER — HYDROMORPHONE HCL 1 MG/ML IJ SOLN
0.2500 mg | INTRAMUSCULAR | Status: DC | PRN
  Administered 2016-04-18: 0.5 mg via INTRAVENOUS

## 2016-04-18 MED ORDER — ROCURONIUM BROMIDE 100 MG/10ML IV SOLN
INTRAVENOUS | Status: DC | PRN
  Administered 2016-04-18: 40 mg via INTRAVENOUS

## 2016-04-18 MED ORDER — SODIUM CHLORIDE 0.9 % IJ SOLN
INTRAMUSCULAR | Status: AC
Start: 2016-04-18 — End: 2016-04-18
  Filled 2016-04-18: qty 10

## 2016-04-18 MED ORDER — ACETAMINOPHEN 325 MG PO TABS: 650.0000 mg | ORAL_TABLET | ORAL | Status: DC | PRN

## 2016-04-18 MED ORDER — FENTANYL CITRATE (PF) 100 MCG/2ML IJ SOLN
INTRAMUSCULAR | Status: DC | PRN
  Administered 2016-04-18: 150 ug via INTRAVENOUS
  Administered 2016-04-18 (×2): 50 ug via INTRAVENOUS

## 2016-04-18 MED ORDER — SODIUM CHLORIDE 0.9% FLUSH: 3.0000 mL | Freq: Two times a day (BID) | INTRAVENOUS | Status: DC

## 2016-04-18 MED ORDER — MIDAZOLAM HCL 2 MG/2ML IJ SOLN
INTRAMUSCULAR | Status: AC
  Filled 2016-04-18: qty 2

## 2016-04-18 MED ORDER — CEFAZOLIN SODIUM-DEXTROSE 2-4 GM/100ML-% IV SOLN
INTRAVENOUS | Status: AC
  Filled 2016-04-18: qty 100

## 2016-04-18 SURGICAL SUPPLY — 32 items
BINDER ABDOMINAL 12 ML 46-62 (SOFTGOODS) IMPLANT
BLADE EXTENDED COATED 6.5IN (ELECTRODE) IMPLANT
BLADE HEX COATED 2.75 (ELECTRODE) ×2 IMPLANT
COVER SURGICAL LIGHT HANDLE (MISCELLANEOUS) ×2 IMPLANT
DECANTER SPIKE VIAL GLASS SM (MISCELLANEOUS) ×1 IMPLANT
DRAPE LAPAROSCOPIC ABDOMINAL (DRAPES) ×2 IMPLANT
ELECT REM PT RETURN 9FT ADLT (ELECTROSURGICAL) ×2
ELECTRODE REM PT RTRN 9FT ADLT (ELECTROSURGICAL) ×1 IMPLANT
GAUZE SPONGE 4X4 12PLY STRL (GAUZE/BANDAGES/DRESSINGS) ×2 IMPLANT
GLOVE BIOGEL PI IND STRL 7.0 (GLOVE) ×1 IMPLANT
GLOVE BIOGEL PI INDICATOR 7.0 (GLOVE) ×1
GLOVE SURG SIGNA 7.5 PF LTX (GLOVE) ×4 IMPLANT
GOWN STRL REUS W/TWL LRG LVL3 (GOWN DISPOSABLE) ×2 IMPLANT
GOWN STRL REUS W/TWL XL LVL3 (GOWN DISPOSABLE) ×4 IMPLANT
KIT BASIN OR (CUSTOM PROCEDURE TRAY) ×2 IMPLANT
LIQUID BAND (GAUZE/BANDAGES/DRESSINGS) ×1 IMPLANT
MESH VENTRALEX ST 1-7/10 CRC S (Mesh General) ×1 IMPLANT
NEEDLE HYPO 22GX1.5 SAFETY (NEEDLE) ×1 IMPLANT
NS IRRIG 1000ML POUR BTL (IV SOLUTION) ×2 IMPLANT
PACK GENERAL/GYN (CUSTOM PROCEDURE TRAY) ×2 IMPLANT
STAPLER VISISTAT 35W (STAPLE) ×1 IMPLANT
SUT NOVA 1 T20/GS 25DT (SUTURE) IMPLANT
SUT VIC AB 3-0 SH 27 (SUTURE)
SUT VIC AB 3-0 SH 27X BRD (SUTURE) IMPLANT
SUT VIC AB 4-0 SH 27 (SUTURE)
SUT VIC AB 4-0 SH 27XBRD (SUTURE) IMPLANT
SUT VICRYL 2 0 18  UND BR (SUTURE)
SUT VICRYL 2 0 18 UND BR (SUTURE) IMPLANT
SYR CONTROL 10ML LL (SYRINGE) ×1 IMPLANT
TOWEL OR 17X26 10 PK STRL BLUE (TOWEL DISPOSABLE) ×2 IMPLANT
TRAY FOLEY W/METER SILVER 14FR (SET/KITS/TRAYS/PACK) IMPLANT
TRAY FOLEY W/METER SILVER 16FR (SET/KITS/TRAYS/PACK) IMPLANT

## 2016-04-18 NOTE — Interval H&P Note (Signed)
History and Physical Interval Note: No change in H and P  04/18/2016 6:53 AM  Austin Curry  has presented today for surgery, with the diagnosis of ventral hernia  The various methods of treatment have been discussed with the patient and family. After consideration of risks, benefits and other options for treatment, the patient has consented to  Procedure(s):  VENTRAL HERNIA REPAIR (N/A) INSERTION OF MESH (N/A) as a surgical intervention .  The patient's history has been reviewed, patient examined, no change in status, stable for surgery.  I have reviewed the patient's chart and labs.  Questions were answered to the patient's satisfaction.     Arie Gable A

## 2016-04-18 NOTE — Anesthesia Preprocedure Evaluation (Signed)
Anesthesia Evaluation  Patient identified by MRN, date of birth, ID band Patient awake    Reviewed: Allergy & Precautions, NPO status , Patient's Chart, lab work & pertinent test results  Airway Mallampati: I  TM Distance: >3 FB Neck ROM: Full    Dental   Pulmonary asthma , former smoker,    Pulmonary exam normal breath sounds clear to auscultation       Cardiovascular Normal cardiovascular exam     Neuro/Psych    GI/Hepatic   Endo/Other    Renal/GU      Musculoskeletal   Abdominal   Peds  Hematology   Anesthesia Other Findings   Reproductive/Obstetrics                             Anesthesia Physical Anesthesia Plan  ASA: II  Anesthesia Plan: General   Post-op Pain Management:    Induction: Intravenous  Airway Management Planned: Oral ETT  Additional Equipment:   Intra-op Plan:   Post-operative Plan: Extubation in OR  Informed Consent: I have reviewed the patients History and Physical, chart, labs and discussed the procedure including the risks, benefits and alternatives for the proposed anesthesia with the patient or authorized representative who has indicated his/her understanding and acceptance.     Plan Discussed with: CRNA and Surgeon  Anesthesia Plan Comments:         Anesthesia Quick Evaluation

## 2016-04-18 NOTE — Transfer of Care (Signed)
Immediate Anesthesia Transfer of Care Note  Patient: Austin KirschnerDerek J Wyles  Procedure(s) Performed: Procedure(s):  VENTRAL HERNIA REPAIR (N/A) INSERTION OF MESH (N/A)  Patient Location: PACU  Anesthesia Type:General  Level of Consciousness: awake, alert  and oriented  Airway & Oxygen Therapy: Patient Spontanous Breathing and Patient connected to face mask oxygen  Post-op Assessment: Report given to RN  Post vital signs: Reviewed and stable  Last Vitals:  Filed Vitals:   04/18/16 0535 04/18/16 0808  BP: 133/79 107/82  Pulse: 64 89  Temp: 36.7 C 36.5 C  Resp: 16 20    Complications: No apparent anesthesia complications

## 2016-04-18 NOTE — Anesthesia Postprocedure Evaluation (Signed)
Anesthesia Post Note  Patient: Austin Curry  Procedure(s) Performed: Procedure(s) (LRB):  VENTRAL HERNIA REPAIR (N/A) INSERTION OF MESH (N/A)  Patient location during evaluation: PACU Anesthesia Type: General Level of consciousness: awake and alert Pain management: pain level controlled Vital Signs Assessment: post-procedure vital signs reviewed and stable Respiratory status: spontaneous breathing, nonlabored ventilation, respiratory function stable and patient connected to nasal cannula oxygen Cardiovascular status: blood pressure returned to baseline and stable Postop Assessment: no signs of nausea or vomiting Anesthetic complications: no    Last Vitals:  Filed Vitals:   04/18/16 0900 04/18/16 0925  BP:  145/83  Pulse:  64  Temp: 36.7 C 36.6 C  Resp:  15    Last Pain:  Filed Vitals:   04/18/16 0940  PainSc: 6                  Charistopher Rumble DAVID

## 2016-04-18 NOTE — Op Note (Signed)
VENTRAL HERNIA REPAIR, INSERTION OF MESH  Procedure Note  Austin KirschnerDerek J Curry 04/18/2016   Pre-op Diagnosis: ventral hernia     Post-op Diagnosis: same  Procedure(s):  VENTRAL HERNIA REPAIR INSERTION OF MESH (4.3 cm round ventralex)  Surgeon(s): Abigail Miyamotoouglas Nina Mondor, MD  Anesthesia: General  Staff:  Circulator: Priscille LovelessElizabeth C Tinsley, RN Scrub Person: Britt BottomAmanda Y Acree, CST; Anner CreteLori Wooten  Estimated Blood Loss: Minimal                         [name]   Date: 04/18/2016  Time: 7:53 AM

## 2016-04-18 NOTE — Anesthesia Procedure Notes (Signed)
Procedure Name: Intubation Date/Time: 04/18/2016 7:23 AM Performed by: Nikol Lemar, Nuala AlphaKRISTOPHER Pre-anesthesia Checklist: Patient identified, Emergency Drugs available, Suction available, Patient being monitored and Timeout performed Patient Re-evaluated:Patient Re-evaluated prior to inductionOxygen Delivery Method: Circle system utilized Preoxygenation: Pre-oxygenation with 100% oxygen Intubation Type: IV induction Ventilation: Mask ventilation without difficulty Laryngoscope Size: Mac and 4 Grade View: Grade II Tube type: Oral Tube size: 7.5 mm Number of attempts: 1 Airway Equipment and Method: Stylet Placement Confirmation: ETT inserted through vocal cords under direct vision,  positive ETCO2,  CO2 detector and breath sounds checked- equal and bilateral Secured at: 23 cm Tube secured with: Tape Dental Injury: Teeth and Oropharynx as per pre-operative assessment  Comments: LTA

## 2016-04-18 NOTE — Discharge Instructions (Signed)
[facility] _______[facility]  UMBILICAL OR INGUINAL HERNIA REPAIR: POST OP INSTRUCTIONS  Always review your discharge instruction sheet given to you by the facility where your surgery was performed. IF YOU HAVE DISABILITY OR FAMILY LEAVE FORMS, YOU MUST BRING THEM TO THE OFFICE FOR PROCESSING.   DO NOT GIVE THEM TO YOUR DOCTOR.  1. A  prescription for pain medication may be given to you upon discharge.  Take your pain medication as prescribed, if needed.  If narcotic pain medicine is not needed, then you may take acetaminophen (Tylenol) or ibuprofen (Advil) as needed. 2. Take your usually prescribed medications unless otherwise directed. 3. If you need a refill on your pain medication, please contact your pharmacy.  They will contact our office to request authorization. Prescriptions will not be filled after 5 pm or on week-ends. 4. You should follow a light diet the first 24 hours after arrival home, such as soup and crackers, etc.  Be sure to include lots of fluids daily.  Resume your normal diet the day after surgery. 5. Most patients will experience some swelling and bruising around the umbilicus or in the groin and scrotum.  Ice packs and reclining will help.  Swelling and bruising can take several days to resolve.  6. It is common to experience some constipation if taking pain medication after surgery.  Increasing fluid intake and taking a stool softener (such as Colace) will usually help or prevent this problem from occurring.  A mild laxative (Milk of Magnesia or Miralax) should be taken according to package directions if there are no bowel movements after 48 hours. 7. Unless discharge instructions indicate otherwise, you may remove your bandages 24-48 hours after surgery, and you may shower at that time.  You may have steri-strips (small skin tapes) in place directly over the incision.  These strips should be left on the skin for 7-10 days.  If your surgeon used skin glue on the  incision, you may shower in 24 hours.  The glue will flake off over the next 2-3 weeks.  Any sutures or staples will be removed at the office during your follow-up visit. 8. ACTIVITIES:  You may resume regular (light) daily activities beginning the next day--such as daily self-care, walking, climbing stairs--gradually increasing activities as tolerated.  You may have sexual intercourse when it is comfortable.  Refrain from any heavy lifting or straining until approved by your doctor. a. You may drive when you are no longer taking prescription pain medication, you can comfortably wear a seatbelt, and you can safely maneuver your car and apply brakes. b. RETURN TO WORK:  __________________________________________________________ 9. You should see your doctor in the office for a follow-up appointment approximately 2-3 weeks after your surgery.  Make sure that you call for this appointment within a day or two after you arrive home to insure a convenient appointment time. 10. OTHER INSTRUCTIONS: NO LIFTING MORE THAN 15 TO 20 POUNDS FOR 4 WEEKS 11. ICE PACK AND IBUPROFEN ALSO FOR PAIN __________________________________________________________________________________________________________________________________________________________________________________________  WHEN TO CALL YOUR DOCTOR: 1. Fever over 101.0 2. Inability to urinate 3. Nausea and/or vomiting 4. Extreme swelling or bruising 5. Continued bleeding from incision. 6. Increased pain, redness, or drainage from the incision  The clinic staff is available to answer your questions during regular business hours.  Please dont hesitate to call and ask to speak to one of the nurses for clinical concerns.  If you have a medical emergency, go to the nearest emergency room or  call 911.  A surgeon from [facility] is always on call at the hospital ° ° °[address], [address], [place], [place]  [address] ? ° [address] [address],  [place], [place]   [address] °[phone] ? [phone] ? FAX [phone] °Web site: [email or web address] °

## 2016-04-18 NOTE — Op Note (Signed)
[name], [name]               ACCOUNT NO.:  [id number]  MEDICAL RECORD NO.:  [id number]  LOCATION:  [facility]                         [facility]  PHYSICIAN:  Abigail Miyamotoouglas Glennis Borger, M.D. DATE OF BIRTH:  08/23/1981  DATE OF PROCEDURE:  04/18/2016 DATE OF DISCHARGE:                              OPERATIVE REPORT   PREOPERATIVE DIAGNOSIS:  Ventral hernia.  POSTOPERATIVE DIAGNOSIS:  Ventral hernia.  PROCEDURE:  Ventral hernia repair with mesh.  SURGEON:  Abigail Miyamotoouglas Yaseen Gilberg, M.D.  ANESTHESIA:  General and 0.5% Marcaine.  ESTIMATED BLOOD LOSS:  Minimal.  FINDINGS:  The patient was found to have a small fascial defect in his mid upper abdomen.  It was repaired with a piece of 4.3 cm round Ventralex ST mesh.  PROCEDURE IN DETAIL:  The patient was brought to the operating room, identified as Austin Curry.  He was placed supine on the operating room table and general anesthesia was induced.  His abdomen was prepped and draped in usual sterile fashion.  I made a small vertical incision at the patient's upper midline over the palpable hernia defect with a scalpel.  I took this down to the hernia sac with electrocautery.  I then dissected out the hernia sac circumferentially.  I opened up the sac and reduced all contents back into the abdominal cavity.  I then excised the sac in its entirety.  The fascial defect was approximately 1 cm in size.  I brought a 4.3 cm round Ventralex ST patch onto the field. I placed it through the fascial opening and pulled it up against the peritoneum with stay ties.  I then sewed the mesh in circumferentially with #1 Novafil sutures.  I then cut the stay ties to close the fascia over top of the mesh with figure-of-eight #1 Novafil suture as well. Wide coverage of the fascial defect appeared to be achieved.  I then anesthetized the fascia and skin further with Marcaine.  Hemostasis appeared to be achieved.  I then closed the subcutaneous tissue  with interrupted 3-0 Vicryl sutures and closed the skin with a running 4-0 Monocryl.  Skin glue was then applied.  The patient tolerated the procedure well.  All the counts were correct at the end of procedure. The patient was then extubated in the operating room and taken in a stable condition to the recovery room.    Abigail Miyamotoouglas Kaysey Berndt, M.D.    DB/MEDQ  D:  04/18/2016  T:  04/18/2016  Job:  [id number]

## 2016-04-19 ENCOUNTER — Encounter (HOSPITAL_COMMUNITY): Payer: Self-pay | Admitting: Surgery

## 2016-04-23 ENCOUNTER — Emergency Department (HOSPITAL_COMMUNITY)
Admission: EM | Admit: 2016-04-23 | Discharge: 2016-04-23 | Disposition: A | Discharge status: Home / Self Care | Payer: Self-pay | Attending: Emergency Medicine | Admitting: Emergency Medicine

## 2016-04-23 ENCOUNTER — Encounter (HOSPITAL_COMMUNITY): Payer: Self-pay | Admitting: *Deleted

## 2016-04-23 DIAGNOSIS — R109 Unspecified abdominal pain: Secondary | ICD-10-CM | POA: Insufficient documentation

## 2016-04-23 DIAGNOSIS — J45909 Unspecified asthma, uncomplicated: Secondary | ICD-10-CM | POA: Insufficient documentation

## 2016-04-23 DIAGNOSIS — G8918 Other acute postprocedural pain: Secondary | ICD-10-CM | POA: Insufficient documentation

## 2016-04-23 LAB — URINALYSIS, ROUTINE W REFLEX MICROSCOPIC
BILIRUBIN URINE: NEGATIVE
Glucose, UA: NEGATIVE mg/dL
Hgb urine dipstick: NEGATIVE
KETONES UR: NEGATIVE mg/dL
LEUKOCYTES UA: NEGATIVE
NITRITE: NEGATIVE
Protein, ur: NEGATIVE mg/dL
Specific Gravity, Urine: 1.022 (ref 1.005–1.030)
pH: 7 (ref 5.0–8.0)

## 2016-04-23 LAB — CBC WITH DIFFERENTIAL/PLATELET
BASOS ABS: 0 10*3/uL (ref 0.0–0.1)
BASOS PCT: 0 %
EOS ABS: 0.3 10*3/uL (ref 0.0–0.7)
Eosinophils Relative: 3 %
HCT: 50.4 % (ref 39.0–52.0)
HEMOGLOBIN: 16.2 g/dL (ref 13.0–17.0)
Lymphocytes Relative: 18 %
Lymphs Abs: 2.1 10*3/uL (ref 0.7–4.0)
MCH: 29.9 pg (ref 26.0–34.0)
MCHC: 32.1 g/dL (ref 30.0–36.0)
MCV: 93.2 fL (ref 78.0–100.0)
Monocytes Absolute: 0.7 10*3/uL (ref 0.1–1.0)
Monocytes Relative: 6 %
NEUTROS ABS: 8.5 10*3/uL — AB (ref 1.7–7.7)
NEUTROS PCT: 73 %
Platelets: 200 10*3/uL (ref 150–400)
RBC: 5.41 MIL/uL (ref 4.22–5.81)
RDW: 13.4 % (ref 11.5–15.5)
WBC: 11.7 10*3/uL — AB (ref 4.0–10.5)

## 2016-04-23 LAB — BASIC METABOLIC PANEL
ANION GAP: 12 (ref 5–15)
BUN: 15 mg/dL (ref 6–20)
CHLORIDE: 97 mmol/L — AB (ref 101–111)
CO2: 28 mmol/L (ref 22–32)
CREATININE: 1.09 mg/dL (ref 0.61–1.24)
Calcium: 9.8 mg/dL (ref 8.9–10.3)
GFR calc non Af Amer: >60 mL/min (ref >60)
Glucose, Bld: 113 mg/dL — ABNORMAL HIGH (ref 65–99)
POTASSIUM: 4.5 mmol/L (ref 3.5–5.1)
SODIUM: 137 mmol/L (ref 135–145)

## 2016-04-23 NOTE — ED Notes (Signed)
The pt is c/o abd pain following a ventral hernia repair this past Monday.  He has had vicodin that was prescribed from surgery.  He last had vicodin at 1200n he is out of pain med

## 2016-04-24 ENCOUNTER — Emergency Department (HOSPITAL_COMMUNITY): Payer: Self-pay

## 2016-04-24 ENCOUNTER — Encounter (HOSPITAL_COMMUNITY): Payer: Self-pay | Admitting: Emergency Medicine

## 2016-04-24 ENCOUNTER — Emergency Department (HOSPITAL_COMMUNITY)
Admission: EM | Admit: 2016-04-24 | Discharge: 2016-04-24 | Disposition: A | Discharge status: Home / Self Care | Payer: Self-pay | Attending: Emergency Medicine | Admitting: Emergency Medicine

## 2016-04-24 DIAGNOSIS — G8918 Other acute postprocedural pain: Secondary | ICD-10-CM | POA: Insufficient documentation

## 2016-04-24 DIAGNOSIS — Z7952 Long term (current) use of systemic steroids: Secondary | ICD-10-CM | POA: Insufficient documentation

## 2016-04-24 DIAGNOSIS — J45909 Unspecified asthma, uncomplicated: Secondary | ICD-10-CM | POA: Insufficient documentation

## 2016-04-24 DIAGNOSIS — Z87891 Personal history of nicotine dependence: Secondary | ICD-10-CM | POA: Insufficient documentation

## 2016-04-24 DIAGNOSIS — R1110 Vomiting, unspecified: Secondary | ICD-10-CM | POA: Insufficient documentation

## 2016-04-24 DIAGNOSIS — Z79899 Other long term (current) drug therapy: Secondary | ICD-10-CM | POA: Insufficient documentation

## 2016-04-24 DIAGNOSIS — Z8719 Personal history of other diseases of the digestive system: Secondary | ICD-10-CM | POA: Insufficient documentation

## 2016-04-24 DIAGNOSIS — Z7951 Long term (current) use of inhaled steroids: Secondary | ICD-10-CM | POA: Insufficient documentation

## 2016-04-24 DIAGNOSIS — R1084 Generalized abdominal pain: Secondary | ICD-10-CM | POA: Insufficient documentation

## 2016-04-24 MED ORDER — OXYCODONE-ACETAMINOPHEN 5-325 MG PO TABS: 1.0000 | ORAL_TABLET | ORAL | Status: DC | PRN

## 2016-04-24 MED ORDER — ONDANSETRON 8 MG PO TBDP
8.0000 mg | ORAL_TABLET | Freq: Once | ORAL | Status: AC
  Administered 2016-04-24: 8 mg via ORAL
  Filled 2016-04-24: qty 1

## 2016-04-24 MED ORDER — HYDROMORPHONE HCL 2 MG/ML IJ SOLN
2.0000 mg | Freq: Once | INTRAMUSCULAR | Status: AC
  Administered 2016-04-24: 2 mg via INTRAMUSCULAR
  Filled 2016-04-24: qty 1

## 2016-04-24 NOTE — ED Notes (Signed)
[facility], [facility] due to wait times. Patient states he is having generalized abd pain, s/p ventral hernia repair on Monday. Patient states his pain has been intermittent since Monday. Patient incision CDI, closed with derma bond. Patient taking vicodin 5-325 x2 pills q 4 hours, last dose at noon. Patient has a few pills left in his bottle, patient states the pain medication isn't really helping much. Patient reports 1 episode of emesis today, states it looked brown. Patient states he has not attempted to make contact with his surgeon.

## 2016-04-24 NOTE — Discharge Instructions (Signed)
Pain Relief Preoperatively and Postoperatively  If you have questions, problems, or concerns about the pain that you may feel after surgery, let your health care provider know. Patients have the right to assessment and management of pain. Severe pain after surgery--and the fear or anxiety associated with that pain--may cause extreme discomfort that:  · Prevents sleep.  · Decreases the ability to breathe deeply and to cough. This can result in pneumonia or other upper airway infections.  · Causes the heart to beat more quickly and the blood pressure to be higher.  · Increases the risk for constipation and bloating.  · Decreases the ability of wounds to heal.  · May result in depression, increased anxiety, and feelings of helplessness.  Relieving pain before surgery (preoperatively) is also important because it lessens pain that you have after surgery (postoperatively). Patients who receive pain relief both before and after surgery experience greater pain relief than those who receive pain relief only after surgery. Let your health care provider know if you are having uncontrolled pain. This is very important. Pain after surgery is more difficult to manage if it is severe, so receiving prompt and adequate treatment of acute pain is necessary. If you become constipated after taking pain medicine, drink more liquids if you can. Your health care provider may have you take a mild laxative.  PAIN CONTROL METHODS  Your health care providers follow policies and procedures about the management of your pain. These guidelines should be explained to you before surgery. Plans for pain control after surgery must be decided upon by you and your health care provider and put into use with your full understanding and agreement. Do not be afraid to ask questions about the care that you are receiving.  Your health care providers will attempt to control your pain in various ways, and these methods may be used together (multimodal  analgesia). Using this approach has many benefits for you, including being able to eat, move around, and leave the hospital sooner.  As-Needed Pain Control  · You may be given pain medicine through an IV tube or as a pill or liquid that you can swallow. Let your health care provider know when you are having pain, and he or she will give you the pain medicine that is ordered for you.  IV Patient-Controlled Analgesia (PCA) Pump  · You can receive your pain medicine through an IV tube that goes into one of your veins. You can control the amount of pain medicine that you get. The pain medicine is controlled by a pump. When you push the button that is hooked up to this pump, you receive a specific amount of pain medicine. This button should be pushed only by you or by someone who is specifically assigned by you to do so. It is set up to keep you from accidentally giving yourself too much pain medicine. You will be able to start using your pain pump in the recovery room after your surgery. This method can be helpful for most types of surgery.  · Tell your health care provider:    If you are having too much pain.    If you are feeling too sleepy or nauseous.  Continuous Epidural Pain Control  · A thin, soft tube (catheter) is put into your back, outside the outer layer of your spinal cord. Pain medicine flows through the catheter to lessen pain in areas of your body that are below the level of catheter placement. Continuous epidural pain control may work best for you   if you are having surgery on your abdomen, hip area, or legs. The epidural catheter is usually put into your back shortly before surgery. It is left in until you can eat, take medicine by mouth, pass urine, and have a bowel movement.  · Giving pain medicine through the epidural catheter may help you to heal more quickly because you can do these things sooner:    Regain normal bowel and bladder function.    Return to eating.    Get up and walk.  Medicine That  Numbs the Area (Local Anesthetic)  You may be given pain medicine:  · As an injection near the area of the pain (local infiltration).  · As an injection near the nerve that controls the sensation to a specific part of your body (peripheral nerve block).  · In your spine to block pain (spinal block).  · Through a local anesthetic reservoir pump. If your surgeon or anesthesiologist selects this option as a part of your pain control, one or more thin, soft tubes will be inserted into your incision site(s) at the end of surgery. These tubes will be connected to a device that is filled with a non-narcotic pain medicine. This medicine gradually empties into your incision site over the next several days. Usually, after all of the medicine is used, your health care provider will remove the tubes and throw away the device.  Opioids  · Moderate to moderately severe acute pain after surgery may respond to opioids. Opioids are narcotic pain medicine. Opioids are often combined with non-narcotic medicines to improve pain relief, lower the risk of side effects, and reduce the chance of addiction.  · If you follow your health care provider's directions about taking opioids and you do not have a history of substance abuse, your risk of becoming addicted is very small. To prevent addiction, opioids are given for short periods of time in careful doses.  Other Methods of Pain Control  · Steroids.  · Physical therapy.  · Heat and cold therapy.  · Compression, such as wrapping an elastic bandage around the area of the pain.  · Massage.     This information is not intended to replace advice given to you by your health care provider. Make sure you discuss any questions you have with your health care provider.     Document Released: 03/04/2003 Document Revised: 01/02/2015 Document Reviewed: 03/08/2011  Elsevier Interactive Patient Education ©2016 [employer]

## 2016-04-24 NOTE — ED Provider Notes (Signed)
CSN: [id number]     Arrival date & time 04/24/16  0136 History  By signing my name below, I, Marisue HumbleMichelle Chaffee, attest that this documentation has been prepared under the direction and in the presence of Gilda Creasehristopher J Pollina, MD . Electronically Signed: Marisue HumbleMichelle Chaffee, Scribe. 04/24/2016. 2:44 AM.   Chief Complaint  Patient presents with  . Post-op Problem    generalized abd pain s/p hernia repair on Monday   The history is provided by the patient. No language interpreter was used.   HPI Comments:  Austin Curry is a 35 y.o. male who presents to the Emergency Department complaining of worsening, intermittent, generalized abdominal pain s/p ventral hernia repair 6 days ago. He has been taking Vicodin every four hours without relief, last dose at noon yesterday. Pt reports one episode of vomiting PTA, brown in color. His last bowel movement was 3 days ago and his last meal was  ~9 hours ago.  Past Medical History  Diagnosis Date  . Asthma   . Hernia    Past Surgical History  Procedure Laterality Date  . Ventral hernia repair N/A 04/18/2016    Procedure:  VENTRAL HERNIA REPAIR;  Surgeon: Abigail Miyamotoouglas Blackman, MD;  Location: [facility];  Service: General;  Laterality: N/A;  . Insertion of mesh N/A 04/18/2016    Procedure: INSERTION OF MESH;  Surgeon: Abigail Miyamotoouglas Blackman, MD;  Location: [facility];  Service: General;  Laterality: N/A;   Family History  Problem Relation Age of Onset  . Diabetes Mother   . Hypertension Mother   . Hyperlipidemia Father    Social History  Substance Use Topics  . Smoking status: Former Smoker -- 0.25 packs/day    Types: Cigarettes    Quit date: 12/09/2014  . Smokeless tobacco: Never Used  . Alcohol Use: 0.0 oz/week    0 Standard drinks or equivalent per week     Comment: 1 drink every other month or so, socially    Review of Systems  Constitutional: Negative for fever.  Gastrointestinal: Positive for vomiting and abdominal pain.  All other systems reviewed and  are negative.  Allergies  Review of patient's allergies indicates no known allergies.  Home Medications   Prior to Admission medications   Medication Sig Start Date End Date Taking? Authorizing Provider  albuterol (PROVENTIL) (2.5 MG/3ML) 0.083% nebulizer solution Take 3 mLs (2.5 mg total) by nebulization every 4 (four) hours as needed for wheezing or shortness of breath. 04/06/16  Yes Rolan BuccoMelanie Belfi, MD  beclomethasone (QVAR) 80 MCG/ACT inhaler Inhale 1 puff into the lungs 2 (two) times daily. 01/12/16  Yes Nelwyn SalisburyStephen A Fry, MD  fluticasone (FLONASE) 50 MCG/ACT nasal spray Place 1 spray into both nostrils daily. Patient taking differently: Place 1 spray into both nostrils daily as needed for allergies.  04/08/16  Yes Jeralyn BennettEzequiel Zamora, MD  HYDROcodone-acetaminophen (NORCO) 5-325 MG tablet Take 1-2 tablets by mouth every 4 (four) hours as needed for moderate pain. 04/18/16  Yes Abigail Miyamotoouglas Blackman, MD  ibuprofen (ADVIL,MOTRIN) 200 MG tablet Take 400 mg by mouth every 8 (eight) hours as needed (for pain.).   Yes Historical Provider, MD  predniSONE (STERAPRED UNI-PAK 21 TAB) 10 MG (21) TBPK tablet Take 6-5-4-3-2-1 tablets by mouth daily till gone. 04/08/16   Jeralyn BennettEzequiel Zamora, MD   BP 119/76 mmHg  Pulse 99  Temp(Src) 98.5 F (36.9 C) (Oral)  Resp 18  Ht 6\' 3"  (1.905 m)  Wt 170 lb (77.111 kg)  BMI 21.25 kg/m2  SpO2 98% Physical Exam  Constitutional: He is oriented to person, place, and time. He appears well-developed and well-nourished. No distress.  HENT:  Head: Normocephalic and atraumatic.  Right Ear: Hearing normal.  Left Ear: Hearing normal.  Nose: Nose normal.  Mouth/Throat: Oropharynx is clear and moist and mucous membranes are normal.  Eyes: Conjunctivae and EOM are normal. Pupils are equal, round, and reactive to light.  Neck: Normal range of motion. Neck supple.  Cardiovascular: Regular rhythm, S1 normal and S2 normal.  Exam reveals no gallop and no friction rub.   No murmur  heard. Pulmonary/Chest: Effort normal and breath sounds normal. No respiratory distress. He exhibits no tenderness.  Abdominal: Soft. Normal appearance and bowel sounds are normal. There is no hepatosplenomegaly. There is no tenderness. There is no rebound, no guarding, no tenderness at McBurney's point and negative Murphy's sign. No hernia.  Small midline incision, dermabond intact; no erythema or drainage; no obvious recurrent hernia  Musculoskeletal: Normal range of motion.  Neurological: He is alert and oriented to person, place, and time. He has normal strength. No cranial nerve deficit or sensory deficit. Coordination normal. GCS eye subscore is 4. GCS verbal subscore is 5. GCS motor subscore is 6.  Skin: Skin is warm, dry and intact. No rash noted. No cyanosis.  Psychiatric: He has a normal mood and affect. His speech is normal and behavior is normal. Thought content normal.  Nursing note and vitals reviewed.  ED Course  Procedures  DIAGNOSTIC STUDIES:  Oxygen Saturation is 98% on RA, normal by my interpretation.    COORDINATION OF CARE:  2:42 AM Will administer medication and x-ray abdomen and chest. Discussed treatment plan with pt at bedside and pt agreed to plan.  Labs Review Labs Reviewed - No data to display  Imaging Review Dg Abd Acute W/chest  04/24/2016  CLINICAL DATA:  Abdominal pain and vomiting EXAM: DG ABDOMEN ACUTE W/ 1V CHEST COMPARISON:  04/08/2016 chest x-ray FINDINGS: There is no evidence of dilated bowel loops or free intraperitoneal air. No radiopaque calculi or other significant radiographic abnormality is seen. Heart size and mediastinal contours are within normal limits. Both lungs are clear. IMPRESSION: Negative abdominal radiographs.  No acute cardiopulmonary disease. Electronically Signed   By: Marnee Spring M.D.   On: 04/24/2016 03:26   I have personally reviewed and evaluated these images and lab results as part of my medical decision-making.   EKG  Interpretation None      MDM   Final diagnoses:  None  postop pain  Patient presents to the emergency department with complaints of pain in the area of his surgery. Patient had ventral hernia repair one week ago. He reports that he has been having pain that has not been controlled with hydrocodone. He developed nausea and vomited once tonight with increased pain after vomiting. He did have a bowel movement yesterday.  Examination of his abdomen reveals that there is no distention. Surgical incision appears to be well-healing without signs of infection or breakdown. There is no obvious hernia present. X-ray shows normal bowel gas. Patient administered additional analgesia here in the ER and will need to follow-up with his surgeon in the office.  I personally performed the services described in this documentation, which was scribed in my presence. The recorded information has been reviewed and is accurate.    Gilda Crease, MD 04/24/16 0500

## 2016-04-24 NOTE — ED Notes (Signed)
Pt reports understanding of discharge information. No questions at time of discharge 

## 2016-05-02 ENCOUNTER — Ambulatory Visit (INDEPENDENT_AMBULATORY_CARE_PROVIDER_SITE_OTHER): Payer: Self-pay | Admitting: Pulmonary Disease

## 2016-05-02 ENCOUNTER — Encounter: Payer: Self-pay | Admitting: Pulmonary Disease

## 2016-05-02 VITALS — BP 116/70 | HR 80 | Ht 75.0 in | Wt 167.0 lb

## 2016-05-02 DIAGNOSIS — J45901 Unspecified asthma with (acute) exacerbation: Secondary | ICD-10-CM

## 2016-05-02 MED ORDER — AEROCHAMBER MV MISC: Status: DC

## 2016-05-02 NOTE — Assessment & Plan Note (Signed)
He was hospitalized in April 2017 for an asthma flare. His history, recent syndrome of symptoms, and his childhood history of asthma are all consistent with this diagnosis. The fact that his symptoms are intermittent is also consistent. Ideally, I like to have spirometry testing in order to try to look for evidence of airflow obstruction but he recently had a hernia repair and he still having some mild abdominal pain around the incision site so we will forego that today.  Based on his recent symptoms, he would be considered a moderate persistent asthmatic specifically considering the fact that he was hospitalized for an asthma flare, and has nighttime symptoms 3-4 times a week.  Plan: Increase controller medicine (Qvar) 2 puffs twice a day Use spacer for all inhaled medicines Follow-up in 3 months, get spirometry at that visit

## 2016-05-02 NOTE — Progress Notes (Signed)
Subjective:    Patient ID: Austin Curry, male    DOB: May 29, 1981, 35 y.o.   MRN: [id number]  HPI Chief Complaint  Patient presents with  . Advice Only    Referred by Dr. Kirtland BouchardK for Asthma.  pt had been well controlled until 03/2016, was hospitalized multiple times for asthma exacerbations   Francee PiccoloDerek says he was diagnosed with asthma at age [age] when [place] was [place].  He thinks he had pneumonia at the time and after that he had problems with his breathing after that.  He was given albuterol afterwards to help take care of his cough.  After that he was prescribed an inhaler pretty much for his entire life.  He says that he would have ER visits from time to time after that, but here recently he has been having to go to the ER for breathing and wheezing.  He was given a breathing treatment for one hour at that time.  He saw Dr. Tommas Olphris Polina at that time.  He said that he needed more breathing treatments.  He says he was admitted, stayed overnight on the hospitalist service.  He did OK with steroids and was discharged home.  He denied sick contacts at that time.    He has been using his inhaler (albuterol) about 5 times in the last week.  He has been taking QVar for 2 years now and this has not changed recently.  He takes it 1 puff daily, no more than that.    When at his best he breathes fine.  He says that winter is typically better for him.  Spring, summer and fall make his symptoms worse.  He is outside due to work a lot.  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  He ends up using his albuterol more.   He used to smoke cigarettes, 1/2 pack per day since age 35.  He quit in March of this year.    He moved into a new apartment not long before this flare up, but he says that the temporal nature is consistent with prior years of his symptoms, so he's not convinced that it was as much the apartment as the time of year.   He gets nighttime symptoms at least once every other night.    Albuterol typically helps  when he takes it.   He does not have a spacer.   Past Medical History  Diagnosis Date  . Asthma   . Hernia      Family History  Problem Relation Age of Onset  . Diabetes Mother   . Hypertension Mother   . Hyperlipidemia Father   . Asthma Brother      Social History   Social History  . Marital Status: Single    Spouse Name: N/A  . Number of Children: N/A  . Years of Education: N/A   Occupational History  . Not on file.   Social History Main Topics  . Smoking status: Former Smoker -- 0.25 packs/day for 15 years    Types: Cigarettes    Quit date: 12/09/2014  . Smokeless tobacco: Never Used  . Alcohol Use: 0.0 oz/week    0 Standard drinks or equivalent per week     Comment: 1 drink every other month or so, socially  . Drug Use: No     Comment: Stopped Marjuiana 03/24/2014  . Sexual Activity: Not on file   Other Topics Concern  . Not on file   Social History Narrative  No Known Allergies   Outpatient Prescriptions Prior to Visit  Medication Sig Dispense Refill  . albuterol (PROVENTIL) (2.5 MG/3ML) 0.083% nebulizer solution Take 3 mLs (2.5 mg total) by nebulization every 4 (four) hours as needed for wheezing or shortness of breath. 30 vial 0  . beclomethasone (QVAR) 80 MCG/ACT inhaler Inhale 1 puff into the lungs 2 (two) times daily. 1 Inhaler 11  . fluticasone (FLONASE) 50 MCG/ACT nasal spray Place 1 spray into both nostrils daily. (Patient taking differently: Place 1 spray into both nostrils daily as needed for allergies. ) 16 g 2  . HYDROcodone-acetaminophen (NORCO) 5-325 MG tablet Take 1-2 tablets by mouth every 4 (four) hours as needed for moderate pain. 40 tablet 0  . ibuprofen (ADVIL,MOTRIN) 200 MG tablet Take 400 mg by mouth every 8 (eight) hours as needed (for pain.).    [name] oxyCODONE-acetaminophen (PERCOCET) 5-325 MG tablet Take 1-2 tablets by mouth every 4 (four) hours as needed. (Patient not taking: Reported on 05/02/2016) 20 tablet 0  . predniSONE  (STERAPRED UNI-PAK 21 TAB) 10 MG (21) TBPK tablet Take 6-5-4-3-2-1 tablets by mouth daily till gone. (Patient not taking: Reported on 05/02/2016) 21 tablet 0   No facility-administered medications prior to visit.       Review of Systems  Constitutional: Negative for fever and unexpected weight change.  HENT: Negative for congestion, dental problem, ear pain, nosebleeds, postnasal drip, rhinorrhea, sinus pressure, sneezing, sore throat and trouble swallowing.   Eyes: Negative for redness and itching.  Respiratory: Positive for shortness of breath and wheezing. Negative for cough and chest tightness.   Cardiovascular: Negative for palpitations and leg swelling.  Gastrointestinal: Negative for nausea and vomiting.  Genitourinary: Negative for dysuria.  Musculoskeletal: Negative for joint swelling.  Skin: Negative for rash.  Neurological: Negative for headaches.  Hematological: Does not bruise/bleed easily.  Psychiatric/Behavioral: Negative for dysphoric mood. The patient is not nervous/anxious.        Objective:   Physical Exam  Filed Vitals:   05/02/16 1448  BP: 116/70  Pulse: 80  Height: 6\' 3"  (1.905 m)  Weight: 167 lb (75.751 kg)  SpO2: 97%  RA  Gen: well appearing, no acute distress HENT: NCAT, OP clear, neck supple without masses Eyes: PERRL, EOMi Lymph: no cervical lymphadenopathy PULM: CTA B CV: RRR, no mgr, no JVD GI: BS+, soft, nontender, no hsm Derm: no rash or skin breakdown MSK: normal bulk and tone Neuro: A&Ox4, CN II-XII intact, strength 5/5 in all 4 extremities Psyche: normal mood and affect  Hospital records reviewed were he was hospitalized for an asthma flare and treated with bronchodilators and steroids Chest x-ray images from April 2017 personally reviewed showing normal pulmonary parenchyma without infiltrate       Assessment & Plan:  Asthma exacerbation He was hospitalized in April 2017 for an asthma flare. His history, recent syndrome of  symptoms, and his childhood history of asthma are all consistent with this diagnosis. The fact that his symptoms are intermittent is also consistent. Ideally, I like to have spirometry testing in order to try to look for evidence of airflow obstruction but he recently had a hernia repair and he still having some mild abdominal pain around the incision site so we will forego that today.  Based on his recent symptoms, he would be considered a moderate persistent asthmatic specifically considering the fact that he was hospitalized for an asthma flare, and has nighttime symptoms 3-4 times a week.  Plan: Increase controller medicine (Qvar) 2  puffs twice a day Use spacer for all inhaled medicines Follow-up in 3 months, get spirometry at that visit     Current outpatient prescriptions:  .  albuterol (PROVENTIL) (2.5 MG/3ML) 0.083% nebulizer solution, Take 3 mLs (2.5 mg total) by nebulization every 4 (four) hours as needed for wheezing or shortness of breath., Disp: 30 vial, Rfl: 0 .  beclomethasone (QVAR) 80 MCG/ACT inhaler, Inhale 1 puff into the lungs 2 (two) times daily., Disp: 1 Inhaler, Rfl: 11 .  fluticasone (FLONASE) 50 MCG/ACT nasal spray, Place 1 spray into both nostrils daily. (Patient taking differently: Place 1 spray into both nostrils daily as needed for allergies. ), Disp: 16 g, Rfl: 2 .  HYDROcodone-acetaminophen (NORCO) 5-325 MG tablet, Take 1-2 tablets by mouth every 4 (four) hours as needed for moderate pain., Disp: 40 tablet, Rfl: 0 .  ibuprofen (ADVIL,MOTRIN) 200 MG tablet, Take 400 mg by mouth every 8 (eight) hours as needed (for pain.)., Disp: , Rfl:  .  Spacer/Aero-Holding Chambers (AEROCHAMBER MV) inhaler, Use as instructed, Disp: 1 each, Rfl: 0

## 2016-05-02 NOTE — Patient Instructions (Signed)
Take Qvar 2 puffs twice a day, no matter how you feel Use Qvar and albuterol through a spacer We will see you back in 3 months or sooner if needed

## 2016-07-05 ENCOUNTER — Other Ambulatory Visit (INDEPENDENT_AMBULATORY_CARE_PROVIDER_SITE_OTHER): Payer: Self-pay

## 2016-07-05 DIAGNOSIS — Z0000 Encounter for general adult medical examination without abnormal findings: Secondary | ICD-10-CM

## 2016-07-05 LAB — LIPID PANEL
CHOL/HDL RATIO: 3
Cholesterol: 157 mg/dL (ref 0–200)
HDL: 46.1 mg/dL (ref >39.00)
LDL Cholesterol: 93 mg/dL (ref 0–99)
NonHDL: 110.9
TRIGLYCERIDES: 90 mg/dL (ref 0.0–149.0)
VLDL: 18 mg/dL (ref 0.0–40.0)

## 2016-07-05 LAB — CBC WITH DIFFERENTIAL/PLATELET
BASOS PCT: 0.8 % (ref 0.0–3.0)
Basophils Absolute: 0 10*3/uL (ref 0.0–0.1)
EOS ABS: 0.1 10*3/uL (ref 0.0–0.7)
EOS PCT: 1.5 % (ref 0.0–5.0)
HCT: 42.3 % (ref 39.0–52.0)
HEMOGLOBIN: 14.2 g/dL (ref 13.0–17.0)
LYMPHS PCT: 36.4 % (ref 12.0–46.0)
Lymphs Abs: 1.4 10*3/uL (ref 0.7–4.0)
MCHC: 33.5 g/dL (ref 30.0–36.0)
MCV: 87.7 fl (ref 78.0–100.0)
MONOS PCT: 5.2 % (ref 3.0–12.0)
Monocytes Absolute: 0.2 10*3/uL (ref 0.1–1.0)
NEUTROS ABS: 2.2 10*3/uL (ref 1.4–7.7)
Neutrophils Relative %: 56.1 % (ref 43.0–77.0)
Platelets: 174 10*3/uL (ref 150.0–400.0)
RBC: 4.82 Mil/uL (ref 4.22–5.81)
RDW: 13.2 % (ref 11.5–15.5)
WBC: 4 10*3/uL (ref 4.0–10.5)

## 2016-07-05 LAB — BASIC METABOLIC PANEL
BUN: 11 mg/dL (ref 6–23)
CHLORIDE: 105 meq/L (ref 96–112)
CO2: 31 meq/L (ref 19–32)
Calcium: 9.4 mg/dL (ref 8.4–10.5)
Creatinine, Ser: 1.02 mg/dL (ref 0.40–1.50)
GFR: 107.21 mL/min (ref >60.00)
Glucose, Bld: 97 mg/dL (ref 70–99)
POTASSIUM: 3.8 meq/L (ref 3.5–5.1)
SODIUM: 140 meq/L (ref 135–145)

## 2016-07-05 LAB — POC URINALSYSI DIPSTICK (AUTOMATED)
Bilirubin, UA: NEGATIVE
Blood, UA: NEGATIVE
Glucose, UA: NEGATIVE
Ketones, UA: NEGATIVE
Leukocytes, UA: NEGATIVE
NITRITE UA: NEGATIVE
PROTEIN UA: NEGATIVE
SPEC GRAV UA: 1.015
UROBILINOGEN UA: 0.2
pH, UA: 7.5

## 2016-07-05 LAB — HEPATIC FUNCTION PANEL
ALBUMIN: 4 g/dL (ref 3.5–5.2)
ALT: 11 U/L (ref 0–53)
AST: 15 U/L (ref 0–37)
Alkaline Phosphatase: 39 U/L (ref 39–117)
BILIRUBIN DIRECT: 0.1 mg/dL (ref 0.0–0.3)
TOTAL PROTEIN: 6.3 g/dL (ref 6.0–8.3)
Total Bilirubin: 0.5 mg/dL (ref 0.2–1.2)

## 2016-07-05 LAB — TSH: TSH: 0.27 u[IU]/mL — ABNORMAL LOW (ref 0.35–4.50)

## 2016-07-06 ENCOUNTER — Emergency Department (HOSPITAL_COMMUNITY): Payer: Self-pay

## 2016-07-06 ENCOUNTER — Emergency Department (HOSPITAL_COMMUNITY)
Admission: EM | Admit: 2016-07-06 | Discharge: 2016-07-06 | Disposition: A | Discharge status: Home / Self Care | Payer: Self-pay | Attending: Emergency Medicine | Admitting: Emergency Medicine

## 2016-07-06 ENCOUNTER — Encounter (HOSPITAL_COMMUNITY): Payer: Self-pay | Admitting: Radiology

## 2016-07-06 DIAGNOSIS — J45909 Unspecified asthma, uncomplicated: Secondary | ICD-10-CM | POA: Insufficient documentation

## 2016-07-06 DIAGNOSIS — R109 Unspecified abdominal pain: Secondary | ICD-10-CM

## 2016-07-06 DIAGNOSIS — Z87891 Personal history of nicotine dependence: Secondary | ICD-10-CM | POA: Insufficient documentation

## 2016-07-06 DIAGNOSIS — R1011 Right upper quadrant pain: Secondary | ICD-10-CM | POA: Insufficient documentation

## 2016-07-06 LAB — CBC WITH DIFFERENTIAL/PLATELET
BASOS ABS: 0 10*3/uL (ref 0.0–0.1)
BASOS PCT: 1 %
EOS ABS: 0.1 10*3/uL (ref 0.0–0.7)
EOS PCT: 2 %
HCT: 42.4 % (ref 39.0–52.0)
HEMOGLOBIN: 13.7 g/dL (ref 13.0–17.0)
Lymphocytes Relative: 43 %
Lymphs Abs: 2.4 10*3/uL (ref 0.7–4.0)
MCH: 29.7 pg (ref 26.0–34.0)
MCHC: 32.3 g/dL (ref 30.0–36.0)
MCV: 91.8 fL (ref 78.0–100.0)
Monocytes Absolute: 0.3 10*3/uL (ref 0.1–1.0)
Monocytes Relative: 6 %
NEUTROS PCT: 48 %
Neutro Abs: 2.7 10*3/uL (ref 1.7–7.7)
PLATELETS: 175 10*3/uL (ref 150–400)
RBC: 4.62 MIL/uL (ref 4.22–5.81)
RDW: 13 % (ref 11.5–15.5)
WBC: 5.6 10*3/uL (ref 4.0–10.5)

## 2016-07-06 LAB — COMPREHENSIVE METABOLIC PANEL
ALK PHOS: 40 U/L (ref 38–126)
ALT: 10 U/L — AB (ref 17–63)
ANION GAP: 4 — AB (ref 5–15)
AST: 17 U/L (ref 15–41)
Albumin: 3.3 g/dL — ABNORMAL LOW (ref 3.5–5.0)
BUN: 7 mg/dL (ref 6–20)
CALCIUM: 8.9 mg/dL (ref 8.9–10.3)
CO2: 30 mmol/L (ref 22–32)
Chloride: 105 mmol/L (ref 101–111)
Creatinine, Ser: 1.06 mg/dL (ref 0.61–1.24)
Glucose, Bld: 93 mg/dL (ref 65–99)
Potassium: 3.9 mmol/L (ref 3.5–5.1)
SODIUM: 139 mmol/L (ref 135–145)
TOTAL PROTEIN: 5.3 g/dL — AB (ref 6.5–8.1)
Total Bilirubin: 0.6 mg/dL (ref 0.3–1.2)

## 2016-07-06 LAB — URINALYSIS, ROUTINE W REFLEX MICROSCOPIC
Bilirubin Urine: NEGATIVE
GLUCOSE, UA: NEGATIVE mg/dL
HGB URINE DIPSTICK: NEGATIVE
KETONES UR: NEGATIVE mg/dL
Leukocytes, UA: NEGATIVE
Nitrite: NEGATIVE
PROTEIN: NEGATIVE mg/dL
Specific Gravity, Urine: 1.022 (ref 1.005–1.030)
pH: 6.5 (ref 5.0–8.0)

## 2016-07-06 LAB — LIPASE, BLOOD: LIPASE: 18 U/L (ref 11–51)

## 2016-07-06 MED ORDER — OXYCODONE-ACETAMINOPHEN 5-325 MG PO TABS
ORAL_TABLET | ORAL | Status: AC
  Filled 2016-07-06: qty 1

## 2016-07-06 MED ORDER — PANTOPRAZOLE SODIUM 40 MG PO TBEC: 40.0000 mg | DELAYED_RELEASE_TABLET | Freq: Every day | ORAL | Status: DC

## 2016-07-06 MED ORDER — SODIUM CHLORIDE 0.9 % IV SOLN: 1000.0000 mL | INTRAVENOUS | Status: DC

## 2016-07-06 MED ORDER — SODIUM CHLORIDE 0.9 % IV SOLN
1000.0000 mL | Freq: Once | INTRAVENOUS | Status: AC
  Administered 2016-07-06: 1000 mL via INTRAVENOUS

## 2016-07-06 MED ORDER — IOPAMIDOL (ISOVUE-300) INJECTION 61%
INTRAVENOUS | Status: AC
  Administered 2016-07-06: 100 mL
  Filled 2016-07-06: qty 100

## 2016-07-06 MED ORDER — OXYCODONE-ACETAMINOPHEN 5-325 MG PO TABS
1.0000 | ORAL_TABLET | Freq: Once | ORAL | Status: AC
  Administered 2016-07-06: 1 via ORAL

## 2016-07-06 MED ORDER — OXYCODONE-ACETAMINOPHEN 5-325 MG PO TABS: 1.0000 | ORAL_TABLET | ORAL | Status: DC | PRN

## 2016-07-06 MED ORDER — MORPHINE SULFATE (PF) 4 MG/ML IV SOLN
4.0000 mg | Freq: Once | INTRAVENOUS | Status: AC
  Administered 2016-07-06: 4 mg via INTRAVENOUS
  Filled 2016-07-06: qty 1

## 2016-07-06 NOTE — ED Provider Notes (Signed)
CSN: [id number]     Arrival date & time 07/06/16  0230 History   First MD Initiated Contact with Patient 07/06/16 0404     Chief Complaint  Patient presents with  . Abdominal Pain     (Consider location/radiation/quality/duration/timing/severity/associated sxs/prior Treatment) Patient is a 35 y.o. male presenting with abdominal pain. The history is provided by the patient.  Abdominal Pain He is status post ventral hernia repair on April 24. He comes in complaining of pain in the right mid and upper abdomen for about the last 3 weeks. He did see his surgeon on June 23, and was prescribed some pain medication which did give some relief. However, when he ran out of the pain medication and started taking ibuprofen for pain, pain got significantly worse. He rates pain at 8/10. It is worse with certain types of movement. Nothing makes it better. There is no associated nausea, vomiting. There is no fever, chills, sweats. He denies constipation or diarrhea. Tonight, pain woke him up which is why he came to the hospital.  Past Medical History  Diagnosis Date  . Asthma   . Hernia    Past Surgical History  Procedure Laterality Date  . Ventral hernia repair N/A 04/18/2016    Procedure:  VENTRAL HERNIA REPAIR;  Surgeon: Abigail Miyamoto, MD;  Location: [facility];  Service: General;  Laterality: N/A;  . Insertion of mesh N/A 04/18/2016    Procedure: INSERTION OF MESH;  Surgeon: Abigail Miyamoto, MD;  Location: [facility];  Service: General;  Laterality: N/A;   Family History  Problem Relation Age of Onset  . Diabetes Mother   . Hypertension Mother   . Hyperlipidemia Father   . Asthma Brother    Social History  Substance Use Topics  . Smoking status: Former Smoker -- 0.25 packs/day for 15 years    Types: Cigarettes    Quit date: 12/09/2014  . Smokeless tobacco: Never Used  . Alcohol Use: 0.0 oz/week    0 Standard drinks or equivalent per week     Comment: 1 drink every other month or so, socially     Review of Systems  Gastrointestinal: Positive for abdominal pain.  All other systems reviewed and are negative.     Allergies  Review of patient's allergies indicates no known allergies.  Home Medications   Prior to Admission medications   Medication Sig Start Date End Date Taking? Authorizing Provider  albuterol (PROVENTIL) (2.5 MG/3ML) 0.083% nebulizer solution Take 3 mLs (2.5 mg total) by nebulization every 4 (four) hours as needed for wheezing or shortness of breath. 04/06/16   Rolan Bucco, MD  beclomethasone (QVAR) 80 MCG/ACT inhaler Inhale 1 puff into the lungs 2 (two) times daily. 01/12/16   Nelwyn Salisbury, MD  fluticasone (FLONASE) 50 MCG/ACT nasal spray Place 1 spray into both nostrils daily. Patient taking differently: Place 1 spray into both nostrils daily as needed for allergies.  04/08/16   Jeralyn Bennett, MD  HYDROcodone-acetaminophen (NORCO) 5-325 MG tablet Take 1-2 tablets by mouth every 4 (four) hours as needed for moderate pain. 04/18/16   Abigail Miyamoto, MD  ibuprofen (ADVIL,MOTRIN) 200 MG tablet Take 400 mg by mouth every 8 (eight) hours as needed (for pain.).    Historical Provider, MD  Spacer/Aero-Holding Chambers (AEROCHAMBER MV) inhaler Use as instructed 05/02/16   Lupita Leash, MD   BP 116/72 mmHg  Pulse 52  Temp(Src) 97.8 F (36.6 C) (Oral)  Resp 18  SpO2 100% Physical Exam  Nursing note and  vitals reviewed.  35 year old male, resting comfortably and in no acute distress. Vital signs are significant for mild bradycardia. Oxygen saturation is 100%, which is normal. Head is normocephalic and atraumatic. PERRLA, EOMI. Oropharynx is clear. Neck is nontender and supple without adenopathy or JVD. Back is nontender and there is no CVA tenderness. Lungs are clear without rales, wheezes, or rhonchi. Chest is nontender. Heart has regular rate and rhythm without murmur. Abdomen is soft, flat, with moderate tenderness on the right side of the abdomen.  Maximum tenderness appears to be in McBurney's area. Surgical scar superior to the umbilicus is only mildly tender and appears to be well healed. No hernia is evident. There are no masses or hepatosplenomegaly and peristalsis is hypoactive. Extremities have no cyanosis or edema, full range of motion is present. Skin is warm and dry without rash. Neurologic: Mental status is normal, cranial nerves are intact, there are no motor or sensory deficits.  ED Course  Procedures (including critical care time) Labs Review Results for orders placed or performed during the hospital encounter of 07/06/16  Comprehensive metabolic panel  Result Value Ref Range   Sodium 139 135 - 145 mmol/L   Potassium 3.9 3.5 - 5.1 mmol/L   Chloride 105 101 - 111 mmol/L   CO2 30 22 - 32 mmol/L   Glucose, Bld 93 65 - 99 mg/dL   BUN 7 6 - 20 mg/dL   Creatinine, Ser 9.601.06 0.61 - 1.24 mg/dL   Calcium 8.9 8.9 - 45.410.3 mg/dL   Total Protein 5.3 (L) 6.5 - 8.1 g/dL   Albumin 3.3 (L) 3.5 - 5.0 g/dL   AST 17 15 - 41 U/L   ALT 10 (L) 17 - 63 U/L   Alkaline Phosphatase 40 38 - 126 U/L   Total Bilirubin 0.6 0.3 - 1.2 mg/dL   GFR calc non Af Amer >60 >60 mL/min   GFR calc Af Amer >60 >60 mL/min   Anion gap 4 (L) 5 - 15  Lipase, blood  Result Value Ref Range   Lipase 18 11 - 51 U/L  CBC with Differential  Result Value Ref Range   WBC 5.6 4.0 - 10.5 K/uL   RBC 4.62 4.22 - 5.81 MIL/uL   Hemoglobin 13.7 13.0 - 17.0 g/dL   HCT 09.842.4 11.939.0 - 14.752.0 %   MCV 91.8 78.0 - 100.0 fL   MCH 29.7 26.0 - 34.0 pg   MCHC 32.3 30.0 - 36.0 g/dL   RDW 82.913.0 56.211.5 - 13.015.5 %   Platelets 175 150 - 400 K/uL   Neutrophils Relative % 48 %   Neutro Abs 2.7 1.7 - 7.7 K/uL   Lymphocytes Relative 43 %   Lymphs Abs 2.4 0.7 - 4.0 K/uL   Monocytes Relative 6 %   Monocytes Absolute 0.3 0.1 - 1.0 K/uL   Eosinophils Relative 2 %   Eosinophils Absolute 0.1 0.0 - 0.7 K/uL   Basophils Relative 1 %   Basophils Absolute 0.0 0.0 - 0.1 K/uL  Urinalysis, Routine w  reflex microscopic  Result Value Ref Range   Color, Urine YELLOW YELLOW   APPearance CLEAR CLEAR   Specific Gravity, Urine 1.022 1.005 - 1.030   pH 6.5 5.0 - 8.0   Glucose, UA NEGATIVE NEGATIVE mg/dL   Hgb urine dipstick NEGATIVE NEGATIVE   Bilirubin Urine NEGATIVE NEGATIVE   Ketones, ur NEGATIVE NEGATIVE mg/dL   Protein, ur NEGATIVE NEGATIVE mg/dL   Nitrite NEGATIVE NEGATIVE   Leukocytes, UA NEGATIVE NEGATIVE  Imaging Review Ct Abdomen Pelvis W Contrast  07/06/2016  CLINICAL DATA:  Intermittent abdominal pain for 2 weeks. Patient saw surgeon on June 23rd regarding abdominal pain. Ventral hernia repair on 04/18/2016. EXAM: CT ABDOMEN AND PELVIS WITH CONTRAST TECHNIQUE: Multidetector CT imaging of the abdomen and pelvis was performed using the standard protocol following bolus administration of intravenous contrast. CONTRAST:  ISOVUE-300 IOPAMIDOL (ISOVUE-300) INJECTION 61% COMPARISON:  06/23/2013 FINDINGS: Mild dependent change in the lung bases. The liver, spleen, gallbladder, pancreas, adrenal glands, kidneys, abdominal aorta, inferior vena cava, and retroperitoneal lymph nodes are unremarkable. Stomach, small bowel, and colon are not abnormally distended. Scattered stool throughout the colon. No free air or free fluid in the abdomen. Pelvis: The appendix is normal. No pelvic mass or lymphadenopathy. Bladder wall is mildly thickened, likely due to under distention although cystitis could also have this appearance. No free or loculated pelvic fluid collections. No destructive bone lesions. IMPRESSION: No acute process demonstrated in the abdomen or pelvis. No evidence of bowel obstruction. Appendix is normal. Electronically Signed   By: Burman Nieves M.D.   On: 07/06/2016 06:04   I have personally reviewed and evaluated these images and lab results as part of my medical decision-making.   MDM   Final diagnoses:  Abdominal pain, unspecified abdominal location    Abdominal pain in  patient postop repair of ventral hernia. Old records reviewed confirming ventral hernia repair on April 24. Pain seems to be in an area of the way from the surgical incision and I do not believe pain is related to surgery. He it was noted that he had an ED visit 1 week postoperatively because of pain. I will send him for CT of abdomen and pelvis to evaluate his pain.  CT is unremarkable. At this point, I wonder if pain is actually related to gastritis or peptic ulcer. He will be discharged with prescription for pantoprazole and also given a prescription for a small number of oxycodone have acetaminophen tablets. He is referred back to his general surgeon for reevaluation although there is no sign of any surgical issues on exam or CT scan. Also, referred back to PCP.  Dione Booze, MD 07/06/16 [phone]

## 2016-07-06 NOTE — Discharge Instructions (Signed)
Abdominal Pain, Adult Many things can cause abdominal pain. Usually, abdominal pain is not caused by a disease and will improve without treatment. It can often be observed and treated at home. Your health care provider will do a physical exam and possibly order blood tests and X-rays to help determine the seriousness of your pain. However, in many cases, more time must pass before a clear cause of the pain can be found. Before that point, your health care provider may not know if you need more testing or further treatment. HOME CARE INSTRUCTIONS Monitor your abdominal pain for any changes. The following actions may help to alleviate any discomfort you are experiencing:  Only take over-the-counter or prescription medicines as directed by your health care provider.  Do not take laxatives unless directed to do so by your health care provider.  Try a clear liquid diet (broth, tea, or water) as directed by your health care provider. Slowly move to a bland diet as tolerated. SEEK MEDICAL CARE IF:  You have unexplained abdominal pain.  You have abdominal pain associated with nausea or diarrhea.  You have pain when you urinate or have a bowel movement.  You experience abdominal pain that wakes you in the night.  You have abdominal pain that is worsened or improved by eating food.  You have abdominal pain that is worsened with eating fatty foods.  You have a fever. SEEK IMMEDIATE MEDICAL CARE IF:  Your pain does not go away within 2 hours.  You keep throwing up (vomiting).  Your pain is felt only in portions of the abdomen, such as the right side or the left lower portion of the abdomen.  You pass bloody or black tarry stools. MAKE SURE YOU:  Understand these instructions.  Will watch your condition.  Will get help right away if you are not doing well or get worse.   This information is not intended to replace advice given to you by your health care provider. Make sure you discuss  any questions you have with your health care provider.   Document Released: 09/21/2005 Document Revised: 09/02/2015 Document Reviewed: 08/21/2013 Elsevier Interactive Patient Education 2016 Elsevier Inc.  Acetaminophen; Oxycodone tablets What is this medicine? ACETAMINOPHEN; OXYCODONE (a set a MEE noe fen; ox i KOE done) is a pain reliever. It is used to treat moderate to severe pain. This medicine may be used for other purposes; ask your health care provider or pharmacist if you have questions. What should I tell my health care provider before I take this medicine? They need to know if you have any of these conditions: -brain tumor -Crohn's disease, inflammatory bowel disease, or ulcerative colitis -drug abuse or addiction -head injury -heart or circulation problems -if you often drink alcohol -kidney disease or problems going to the bathroom -liver disease -lung disease, asthma, or breathing problems -an unusual or allergic reaction to acetaminophen, oxycodone, other opioid analgesics, other medicines, foods, dyes, or preservatives -pregnant or trying to get pregnant -breast-feeding How should I use this medicine? Take this medicine by mouth with a full glass of water. Follow the directions on the prescription label. You can take it with or without food. If it upsets your stomach, take it with food. Take your medicine at regular intervals. Do not take it more often than directed. Talk to your pediatrician regarding the use of this medicine in children. Special care may be needed. Patients over 34 years old may have a stronger reaction and need a smaller dose. Overdosage:  If you think you have taken too much of this medicine contact a poison control center or emergency room at once. NOTE: This medicine is only for you. Do not share this medicine with others. What if I miss a dose? If you miss a dose, take it as soon as you can. If it is almost time for your next dose, take only that  dose. Do not take double or extra doses. What may interact with this medicine? -alcohol -antihistamines -barbiturates like amobarbital, butalbital, butabarbital, methohexital, pentobarbital, phenobarbital, thiopental, and secobarbital -benztropine -drugs for bladder problems like solifenacin, trospium, oxybutynin, tolterodine, hyoscyamine, and methscopolamine -drugs for breathing problems like ipratropium and tiotropium -drugs for certain stomach or intestine problems like propantheline, homatropine methylbromide, glycopyrrolate, atropine, belladonna, and dicyclomine -general anesthetics like etomidate, ketamine, nitrous oxide, propofol, desflurane, enflurane, halothane, isoflurane, and sevoflurane -medicines for depression, anxiety, or psychotic disturbances -medicines for sleep -muscle relaxants -naltrexone -narcotic medicines (opiates) for pain -phenothiazines like perphenazine, thioridazine, chlorpromazine, mesoridazine, fluphenazine, prochlorperazine, promazine, and trifluoperazine -scopolamine -tramadol -trihexyphenidyl This list may not describe all possible interactions. Give your health care provider a list of all the medicines, herbs, non-prescription drugs, or dietary supplements you use. Also tell them if you smoke, drink alcohol, or use illegal drugs. Some items may interact with your medicine. What should I watch for while using this medicine? Tell your doctor or health care professional if your pain does not go away, if it gets worse, or if you have new or a different type of pain. You may develop tolerance to the medicine. Tolerance means that you will need a higher dose of the medication for pain relief. Tolerance is normal and is expected if you take this medicine for a long time. Do not suddenly stop taking your medicine because you may develop a severe reaction. Your body becomes used to the medicine. This does NOT mean you are addicted. Addiction is a behavior related to  getting and using a drug for a non-medical reason. If you have pain, you have a medical reason to take pain medicine. Your doctor will tell you how much medicine to take. If your doctor wants you to stop the medicine, the dose will be slowly lowered over time to avoid any side effects. You may get drowsy or dizzy. Do not drive, use machinery, or do anything that needs mental alertness until you know how this medicine affects you. Do not stand or sit up quickly, especially if you are an older patient. This reduces the risk of dizzy or fainting spells. Alcohol may interfere with the effect of this medicine. Avoid alcoholic drinks. There are different types of narcotic medicines (opiates) for pain. If you take more than one type at the same time, you may have more side effects. Give your health care provider a list of all medicines you use. Your doctor will tell you how much medicine to take. Do not take more medicine than directed. Call emergency for help if you have problems breathing. The medicine will cause constipation. Try to have a bowel movement at least every 2 to 3 days. If you do not have a bowel movement for 3 days, call your doctor or health care professional. Do not take Tylenol (acetaminophen) or medicines that have acetaminophen with this medicine. Too much acetaminophen can be very dangerous. Many nonprescription medicines contain acetaminophen. Always read the labels carefully to avoid taking more acetaminophen. What side effects may I notice from receiving this medicine? Side effects that you should report to  your doctor or health care professional as soon as possible: -allergic reactions like skin rash, itching or hives, swelling of the face, lips, or tongue -breathing difficulties, wheezing -confusion -light headedness or fainting spells -severe stomach pain -unusually weak or tired -yellowing of the skin or the whites of the eyes Side effects that usually do not require medical  attention (report to your doctor or health care professional if they continue or are bothersome): -dizziness -drowsiness -nausea -vomiting This list may not describe all possible side effects. Call your doctor for medical advice about side effects. You may report side effects to FDA at 1-800-FDA-1088. Where should I keep my medicine? Keep out of the reach of children. This medicine can be abused. Keep your medicine in a safe place to protect it from theft. Do not share this medicine with anyone. Selling or giving away this medicine is dangerous and against the law. This medicine may cause accidental overdose and death if it taken by other adults, children, or pets. Mix any unused medicine with a substance like cat litter or coffee grounds. Then throw the medicine away in a sealed container like a sealed bag or a coffee can with a lid. Do not use the medicine after the expiration date. Store at room temperature between 20 and 25 degrees C (68 and 77 degrees F). NOTE: This sheet is a summary. It may not cover all possible information. If you have questions about this medicine, talk to your doctor, pharmacist, or health care provider.    2016, Elsevier/Gold Standard. (2014-11-12 15:18:46)  Pantoprazole tablets What is this medicine? PANTOPRAZOLE (pan TOE pra zole) prevents the production of acid in the stomach. It is used to treat gastroesophageal reflux disease (GERD), inflammation of the esophagus, and Zollinger-Ellison syndrome. This medicine may be used for other purposes; ask your health care provider or pharmacist if you have questions. What should I tell my health care provider before I take this medicine? They need to know if you have any of these conditions: -liver disease -low levels of magnesium in the blood -an unusual or allergic reaction to omeprazole, lansoprazole, pantoprazole, rabeprazole, other medicines, foods, dyes, or preservatives -pregnant or trying to get  pregnant -breast-feeding How should I use this medicine? Take this medicine by mouth. Swallow the tablets whole with a drink of water. Follow the directions on the prescription label. Do not crush, break, or chew. Take your medicine at regular intervals. Do not take your medicine more often than directed. Talk to your pediatrician regarding the use of this medicine in children. While this drug may be prescribed for children as young as 5 years for selected conditions, precautions do apply. Overdosage: If you think you have taken too much of this medicine contact a poison control center or emergency room at once. NOTE: This medicine is only for you. Do not share this medicine with others. What if I miss a dose? If you miss a dose, take it as soon as you can. If it is almost time for your next dose, take only that dose. Do not take double or extra doses. What may interact with this medicine? Do not take this medicine with any of the following medications: -atazanavir -nelfinavir This medicine may also interact with the following medications: -ampicillin -delavirdine -erlotinib -iron salts -medicines for fungal infections like ketoconazole, itraconazole and voriconazole -methotrexate -mycophenolate mofetil -warfarin This list may not describe all possible interactions. Give your health care provider a list of all the medicines, herbs, non-prescription drugs, or  dietary supplements you use. Also tell them if you smoke, drink alcohol, or use illegal drugs. Some items may interact with your medicine. What should I watch for while using this medicine? It can take several days before your stomach pain gets better. Check with your doctor or health care professional if your condition does not start to get better, or if it gets worse. You may need blood work done while you are taking this medicine. What side effects may I notice from receiving this medicine? Side effects that you should report to  your doctor or health care professional as soon as possible: -allergic reactions like skin rash, itching or hives, swelling of the face, lips, or tongue -bone, muscle or joint pain -breathing problems -chest pain or chest tightness -dark yellow or brown urine -dizziness -fast, irregular heartbeat -feeling faint or lightheaded -fever or sore throat -muscle spasm -palpitations -redness, blistering, peeling or loosening of the skin, including inside the mouth -seizures -tremors -unusual bleeding or bruising -unusually weak or tired -yellowing of the eyes or skin Side effects that usually do not require medical attention (Report these to your doctor or health care professional if they continue or are bothersome.): -constipation -diarrhea -dry mouth -headache -nausea This list may not describe all possible side effects. Call your doctor for medical advice about side effects. You may report side effects to FDA at 1-800-FDA-1088. Where should I keep my medicine? Keep out of the reach of children. Store at room temperature between 15 and 30 degrees C (59 and 86 degrees F). Protect from light and moisture. Throw away any unused medicine after the expiration date. NOTE: This sheet is a summary. It may not cover all possible information. If you have questions about this medicine, talk to your doctor, pharmacist, or health care provider.    2016, Elsevier/Gold Standard. (2015-01-30 14:45:56)

## 2016-07-06 NOTE — ED Notes (Signed)
Pt reports intermittent abdominal pain for 2 weeks. Pt saw his surgeon June 23 regarding his abdominal pain. Pt had ventral hernia repaired 4/24. Pt reports no abdominal pain relief since the surgery. Last dose of pain medication ibuprofen yesterday. Pt denies n/v/d, fevers.

## 2016-07-06 NOTE — ED Notes (Signed)
Pt ambulated to restroom with ease, nad noted, and then transported to CT>

## 2016-07-12 ENCOUNTER — Encounter: Payer: Self-pay | Admitting: Internal Medicine

## 2016-07-12 ENCOUNTER — Ambulatory Visit (INDEPENDENT_AMBULATORY_CARE_PROVIDER_SITE_OTHER): Payer: Self-pay | Admitting: Internal Medicine

## 2016-07-12 VITALS — BP 120/74 | HR 70 | Temp 98.3°F | Ht 73.5 in | Wt 157.0 lb

## 2016-07-12 DIAGNOSIS — J4520 Mild intermittent asthma, uncomplicated: Secondary | ICD-10-CM

## 2016-07-12 MED ORDER — ALBUTEROL SULFATE (2.5 MG/3ML) 0.083% IN NEBU: 2.5000 mg | INHALATION_SOLUTION | RESPIRATORY_TRACT | Status: DC | PRN

## 2016-07-12 NOTE — Progress Notes (Signed)
Subjective:    Patient ID: Austin Curry, male    DOB: 1981-01-20, 35 y.o.   MRN: [id number]  HPI  35 year old patient who is seen today for a preventive health examination He has a history of chronic asthma and now is followed by pulmonary medicine.  He was seen in May. He is status post surgery for a ventral hernia repair on April 24. He was seen in the ED last week for recurrent abdominal pain.  Abdominal CT was negative His pulmonary status has been stable.  He smokes a rare cigarette only  [place] Graduated [employer]. College Degree; Graduated [employer]  Family history father in good health Mother history of diabetes and coronary artery disease Paternal grandmother with colon cancer  Past Medical History  Diagnosis Date  . Asthma   . Hernia      Social History   Social History  . Marital Status: Single    Spouse Name: N/A  . Number of Children: N/A  . Years of Education: N/A   Occupational History  . Not on file.   Social History Main Topics  . Smoking status: Former Smoker -- 0.25 packs/day for 15 years    Types: Cigarettes    Quit date: 12/09/2014  . Smokeless tobacco: Never Used  . Alcohol Use: 0.0 oz/week    0 Standard drinks or equivalent per week     Comment: 1 drink every other month or so, socially  . Drug Use: No     Comment: Stopped Marjuiana 03/24/2014  . Sexual Activity: Not on file   Other Topics Concern  . Not on file   Social History Narrative    Past Surgical History  Procedure Laterality Date  . Ventral hernia repair N/A 04/18/2016    Procedure:  VENTRAL HERNIA REPAIR;  Surgeon: Abigail Miyamotoouglas Blackman, MD;  Location: [facility];  Service: General;  Laterality: N/A;  . Insertion of mesh N/A 04/18/2016    Procedure: INSERTION OF MESH;  Surgeon: Abigail Miyamotoouglas Blackman, MD;  Location: [facility];  Service: General;  Laterality: N/A;    Family History  Problem Relation Age of Onset  .  Diabetes Mother   . Hypertension Mother   . Hyperlipidemia Father   . Asthma Brother     No Known Allergies  Current Outpatient Prescriptions on File Prior to Visit  Medication Sig Dispense Refill  . albuterol (PROVENTIL) (2.5 MG/3ML) 0.083% nebulizer solution Take 3 mLs (2.5 mg total) by nebulization every 4 (four) hours as needed for wheezing or shortness of breath. 30 vial 0  . beclomethasone (QVAR) 80 MCG/ACT inhaler Inhale 1 puff into the lungs 2 (two) times daily. (Patient taking differently: Inhale 1 puff into the lungs 2 (two) times daily as needed (shortness of breath). ) 1 Inhaler 11  . fluticasone (FLONASE) 50 MCG/ACT nasal spray Place 1 spray into both nostrils daily. (Patient taking differently: Place 1 spray into both nostrils daily as needed for allergies. ) 16 g 2  . HYDROcodone-acetaminophen (NORCO) 5-325 MG tablet Take 1-2 tablets by mouth every 4 (four) hours as needed for moderate pain. 40 tablet 0  . ibuprofen (ADVIL,MOTRIN) 200 MG tablet Take 400 mg by mouth every 8 (eight) hours as needed (for pain.).    [name]. oxyCODONE-acetaminophen (PERCOCET) 5-325 MG tablet Take 1 tablet by mouth every 4 (four) hours as needed for moderate pain. 15 tablet 0  . pantoprazole (PROTONIX) 40 MG tablet Take 1 tablet (  40 mg total) by mouth daily. 15 tablet 0  . Spacer/Aero-Holding Chambers (AEROCHAMBER MV) inhaler Use as instructed 1 each 0   No current facility-administered medications on file prior to visit.    BP 120/74 mmHg  Pulse 70  Temp(Src) 98.3 F (36.8 C) (Oral)  Ht 6' 1.5" (1.867 m)  Wt 157 lb (71.215 kg)  BMI 20.43 kg/m2  SpO2 98%     Review of Systems  Constitutional: Negative for fever, chills, activity change, appetite change and fatigue.  HENT: Negative for congestion, dental problem, ear pain, hearing loss, mouth sores, rhinorrhea, sinus pressure, sneezing, tinnitus, trouble swallowing and voice change.   Eyes: Negative for photophobia, pain, redness and visual  disturbance.  Respiratory: Negative for apnea, cough, choking, chest tightness, shortness of breath and wheezing.   Cardiovascular: Negative for chest pain, palpitations and leg swelling.  Gastrointestinal: Positive for abdominal pain. Negative for nausea, vomiting, diarrhea, constipation, blood in stool, abdominal distention, anal bleeding and rectal pain.  Genitourinary: Negative for dysuria, urgency, frequency, hematuria, flank pain, decreased urine volume, discharge, penile swelling, scrotal swelling, difficulty urinating, genital sores and testicular pain.  Musculoskeletal: Negative for myalgias, back pain, joint swelling, arthralgias, gait problem, neck pain and neck stiffness.  Skin: Negative for color change, rash and wound.  Neurological: Negative for dizziness, tremors, seizures, syncope, facial asymmetry, speech difficulty, weakness, light-headedness, numbness and headaches.  Hematological: Negative for adenopathy. Does not bruise/bleed easily.  Psychiatric/Behavioral: Negative for suicidal ideas, hallucinations, behavioral problems, confusion, sleep disturbance, self-injury, dysphoric mood, decreased concentration and agitation. The patient is not nervous/anxious.        Objective:   Physical Exam  Constitutional: He appears well-developed and well-nourished.  HENT:  Head: Normocephalic and atraumatic.  Right Ear: External ear normal.  Left Ear: External ear normal.  Nose: Nose normal.  Mouth/Throat: Oropharynx is clear and moist.  Eyes: Conjunctivae and EOM are normal. Pupils are equal, round, and reactive to light. No scleral icterus.  Neck: Normal range of motion. Neck supple. No JVD present. No thyromegaly present.  Cardiovascular: Regular rhythm, normal heart sounds and intact distal pulses.  Exam reveals no gallop and no friction rub.   No murmur heard. Pulmonary/Chest: Effort normal and breath sounds normal. He exhibits no tenderness.  Abdominal: Soft. Bowel sounds are  normal. He exhibits no distension and no mass. There is no tenderness.  Well-healed small surgical scar above the umbilicus Mild generalized tenderness  Genitourinary: Penis normal.  Musculoskeletal: Normal range of motion. He exhibits no edema or tenderness.  Lymphadenopathy:    He has no cervical adenopathy.  Neurological: He is alert. He has normal reflexes. No cranial nerve deficit. Coordination normal.  Skin: Skin is warm and dry. No rash noted.  Psychiatric: He has a normal mood and affect. His behavior is normal.          Assessment & Plan:   Preventive health examination Chronic asthma, stable Status post surgery for ventral hernia, still with some post op discomfort  Laboratory studies reviewed Medications updated  Follow-up pulmonary medicine and general surgery if needed

## 2016-07-12 NOTE — Patient Instructions (Signed)

## 2016-07-12 NOTE — Addendum Note (Signed)
Addended by: Roosvelt HarpsHARRIS, Jamaiya Tunnell R on: 07/12/2016 09:41 AM   Modules accepted: Orders, SmartSet

## 2016-07-12 NOTE — Progress Notes (Signed)
Pre visit review using our clinic review tool, if applicable. No additional management support is needed unless otherwise documented below in the visit note. 

## 2016-08-15 ENCOUNTER — Encounter: Payer: Self-pay | Admitting: Pulmonary Disease

## 2016-08-15 ENCOUNTER — Ambulatory Visit (INDEPENDENT_AMBULATORY_CARE_PROVIDER_SITE_OTHER): Payer: Self-pay | Admitting: Pulmonary Disease

## 2016-08-15 DIAGNOSIS — J4520 Mild intermittent asthma, uncomplicated: Secondary | ICD-10-CM

## 2016-08-15 NOTE — Patient Instructions (Addendum)
It is nice to meet you today. Continue you Qvar twice daily every day. Remember to rinse your mouth after use. Continue using your albuterol inhaler with spacer as needed for shortness of breath or wheezing. Continue using your neb treatments as needed for shortness of breath or wheezing. We will schedule PFT's today Follow up with Dr. Kendrick FriesMcQuaid after PFT's. Please contact office for sooner follow up if symptoms do not improve or worsen or seek emergency care

## 2016-08-15 NOTE — Progress Notes (Signed)
History of Present Illness Austin Curry is a 35 y.o. male former smoker with Asthma followed by Dr. Kendrick FriesMcQuaid.   08/15/2016 3 month follow up with Spirometry Pt. Presents to the office today. He is doing well. He is compliant with his Qvar twice daily.He is using his Albuterol inhaler for break through wheezing and his Neb treatments as needed for wheezing and shortness of breath. He is no longer using his Flonase. He states that he has an increase in wheezing with heat and humidity, but is manageable with rescue meds. No significant flares since April. 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 He had abdominal hernia repair April 18, 2016. He knows he is due for PFT's.    Past medical hx Past Medical History:  Diagnosis Date  . Asthma   . Hernia      Past surgical hx, Family hx, Social hx all reviewed.  Current Outpatient Prescriptions on File Prior to Visit  Medication Sig  . albuterol (PROVENTIL) (2.5 MG/3ML) 0.083% nebulizer solution Take 3 mLs (2.5 mg total) by nebulization every 4 (four) hours as needed for wheezing or shortness of breath.  . beclomethasone (QVAR) 80 MCG/ACT inhaler Inhale 1 puff into the lungs 2 (two) times daily. (Patient taking differently: Inhale 1 puff into the lungs 2 (two) times daily as needed (shortness of breath). )  . fluticasone (FLONASE) 50 MCG/ACT nasal spray Place 1 spray into both nostrils daily. (Patient taking differently: Place 1 spray into both nostrils daily as needed for allergies. )  . HYDROcodone-acetaminophen (NORCO) 5-325 MG tablet Take 1-2 tablets by mouth every 4 (four) hours as needed for moderate pain.  [name]. ibuprofen (ADVIL,MOTRIN) 200 MG tablet Take 400 mg by mouth every 8 (eight) hours as needed (for pain.).  [name]. oxyCODONE-acetaminophen (PERCOCET) 5-325 MG tablet Take 1 tablet by mouth every 4 (four) hours as needed for moderate pain.  . pantoprazole (PROTONIX) 40 MG tablet Take 1 tablet  (40 mg total) by mouth daily.  [name]. Spacer/Aero-Holding Chambers (AEROCHAMBER MV) inhaler Use as instructed   No current facility-administered medications on file prior to visit.      No Known Allergies  Review Of Systems:  Constitutional:   No  weight loss, night sweats,  Fevers, chills, fatigue, or  lassitude.  HEENT:   No headaches,  Difficulty swallowing,  Tooth/dental problems, or  Sore throat,                No sneezing, itching, ear ache, nasal congestion, post nasal drip,   CV:  No chest pain,  Orthopnea, PND, swelling in lower extremities, anasarca, dizziness, palpitations, syncope.   GI  No heartburn, indigestion, abdominal pain, nausea, vomiting, diarrhea, change in bowel habits, loss of appetite, bloody stools.   Resp: + shortness of breath with exertion less  at rest.  No excess mucus, no productive cough,  No non-productive cough,  No coughing up of blood.  No change in color of mucus.  + wheezing.  No chest wall deformity  Skin: no rash or lesions.  GU: no dysuria, change in color of urine, no urgency or frequency.  No flank pain, no hematuria   MS:  No joint pain or swelling.  No decreased range of motion.  No back pain.  Psych:  No change in mood or affect. No depression or anxiety.  No memory loss.   Vital Signs BP 116/68 (BP Location: Right Arm, Cuff Size: Normal)  Pulse (!) 57   Ht 6' 1.5" (1.867 m)   Wt 165 lb 6.4 oz (75 kg)   SpO2 99%   BMI 21.53 kg/m    Physical Exam:  General- No distress,  A&Ox3, thin male ENT: No sinus tenderness, TM clear, pale nasal mucosa, no oral exudate,no post nasal drip, no LAN Cardiac: S1, S2, regular rate and rhythm, no murmur Chest: No wheeze/ rales/ dullness; no accessory muscle use, no nasal flaring, no sternal retractions Abd.: Soft Non-tender Ext: No clubbing cyanosis, edema Neuro:  normal strength Skin: No rashes, warm and dry Psych: normal mood and behavior   Assessment/Plan  Asthma, chronic Controlled  asthma without interval exacerbation Plan: Continue you Qvar twice daily every day. Remember to rinse your mouth after use. Continue using your albuterol inhaler with spacer as needed for shortness of breath or wheezing. Continue using your neb treatments as needed for shortness of breath or wheezing. We will schedule PFT's today Follow up with Dr. Kendrick FriesMcQuaid  In 4 -6 months. Please contact office for sooner follow up if symptoms do not improve or worsen or seek emergency care       Bevelyn NgoSarah F Karnell Vanderloop, NP 08/15/2016  2:35 PM

## 2016-08-15 NOTE — Progress Notes (Signed)
Attending:  I have seen and examined the patient today with Austin Curry and I agree with the findings and her note.  Mr. Austin Curry has been feeling much better Continues to take Qvar twice a day Had some wheezing this morning His dyspnea is a bit worse in the heat and humidity  On exam: Lungs clear to auscultation, normal effort Cardiovascular regular rate and rhythm, no murmurs gallops or rubs  Moderate persistent asthma: Scheduled function tests, continue Qvar, continue as needed albuterol Follow-up 4-6 months  Austin CarolinaBrent Helaman Mecca, MD [facility] Pager: [phone] Cell: [phone] After 3pm or if no response, call [phone]

## 2016-08-15 NOTE — Assessment & Plan Note (Signed)
Controlled asthma without interval exacerbation Plan: Continue you Qvar twice daily every day. Remember to rinse your mouth after use. Continue using your albuterol inhaler with spacer as needed for shortness of breath or wheezing. Continue using your neb treatments as needed for shortness of breath or wheezing. We will schedule PFT's today Follow up with Dr. Kendrick FriesMcQuaid  In 4 -6 months. Please contact office for sooner follow up if symptoms do not improve or worsen or seek emergency care

## 2016-08-23 ENCOUNTER — Ambulatory Visit (HOSPITAL_COMMUNITY)
Admission: RE | Admit: 2016-08-23 | Discharge: 2016-08-23 | Disposition: A | Discharge status: Home / Self Care | Payer: Self-pay | Source: Ambulatory Visit | Attending: Pulmonary Disease | Admitting: Pulmonary Disease

## 2016-08-23 DIAGNOSIS — J4520 Mild intermittent asthma, uncomplicated: Secondary | ICD-10-CM | POA: Insufficient documentation

## 2016-08-23 LAB — PULMONARY FUNCTION TEST
DL/VA % pred: 116 %
DL/VA: 5.68 ml/min/mmHg/L
DLCO UNC: 41.42 ml/min/mmHg
DLCO unc % pred: 109 %
FEF 25-75 Post: 3.68 L/sec
FEF 25-75 Pre: 2.25 L/sec
FEF2575-%Change-Post: 63 %
FEF2575-%Pred-Post: 84 %
FEF2575-%Pred-Pre: 51 %
FEV1-%CHANGE-POST: 17 %
FEV1-%PRED-POST: 94 %
FEV1-%PRED-PRE: 80 %
FEV1-POST: 3.97 L
FEV1-Pre: 3.38 L
FEV1FVC-%Change-Post: 8 %
FEV1FVC-%PRED-PRE: 80 %
FEV6-%CHANGE-POST: 8 %
FEV6-%PRED-POST: 108 %
FEV6-%Pred-Pre: 100 %
FEV6-POST: 5.45 L
FEV6-PRE: 5.03 L
FEV6FVC-%CHANGE-POST: 0 %
FEV6FVC-%PRED-POST: 102 %
FEV6FVC-%PRED-PRE: 101 %
FVC-%Change-Post: 8 %
FVC-%Pred-Post: 106 %
FVC-%Pred-Pre: 98 %
FVC-Post: 5.45 L
FVC-Pre: 5.04 L
PRE FEV6/FVC RATIO: 100 %
Post FEV1/FVC ratio: 73 %
Post FEV6/FVC ratio: 100 %
Pre FEV1/FVC ratio: 67 %
RV % PRED: 511 %
RV: 9.92 L
TLC % PRED: 193 %
TLC: 14.98 L

## 2016-08-23 MED ORDER — ALBUTEROL SULFATE (2.5 MG/3ML) 0.083% IN NEBU
2.5000 mg | INHALATION_SOLUTION | Freq: Once | RESPIRATORY_TRACT | Status: AC
  Administered 2016-08-23: 2.5 mg via RESPIRATORY_TRACT

## 2016-09-01 ENCOUNTER — Encounter: Payer: Self-pay | Admitting: Pulmonary Disease

## 2016-09-01 NOTE — Telephone Encounter (Signed)
BQ please advise on PFT results. Thanks 

## 2016-09-05 ENCOUNTER — Ambulatory Visit (INDEPENDENT_AMBULATORY_CARE_PROVIDER_SITE_OTHER): Payer: Self-pay | Admitting: Pulmonary Disease

## 2016-09-05 ENCOUNTER — Encounter: Payer: Self-pay | Admitting: Pulmonary Disease

## 2016-09-05 DIAGNOSIS — Z23 Encounter for immunization: Secondary | ICD-10-CM

## 2016-09-05 DIAGNOSIS — J4540 Moderate persistent asthma, uncomplicated: Secondary | ICD-10-CM

## 2016-09-05 MED ORDER — FLUTICASONE FUROATE-VILANTEROL 200-25 MCG/INH IN AEPB: 1.0000 | INHALATION_SPRAY | Freq: Every day | RESPIRATORY_TRACT | Status: DC

## 2016-09-05 MED ORDER — FLUTICASONE FUROATE-VILANTEROL 100-25 MCG/INH IN AEPB: 1.0000 | INHALATION_SPRAY | Freq: Every day | RESPIRATORY_TRACT | 0 refills | Status: DC

## 2016-09-05 NOTE — Progress Notes (Signed)
Subjective:    Patient ID: Austin Curry, male    DOB: [birth year], 35 y.o.   MRN: [id number]  Synopsis: Referred in 2017 for moderate persistent asthma. 08/26/2016 pulmonary function test ratio 67%, FEV1 3.38 L 80% predicted 17% change to 3.97 L 94% predicted, total lung capacity elevated, DLCO 41.4 109% predicted   HPI Chief Complaint  Patient presents with  . Follow-up    review PFT.  c/o increased chest tightness worse with weather changes.     Austin Curry says that with the weather change he is still having some chest tightness. He is still taking QVar 2 puffs bid.  He still needs to use albuterol 2 per day. He has been sneezing more lately, this was worse after some of the rain we have been having lately.  No itchy eyes, scratchy throat, no fever, no chills or myalgias.  No tobacco use.  No heartburn or indigestion.     Past Medical History:  Diagnosis Date  . Asthma   . Hernia       Review of Systems  Constitutional: Negative for diaphoresis, fatigue and fever.  HENT: Positive for sneezing. Negative for postnasal drip, rhinorrhea and sinus pressure.   Respiratory: Positive for chest tightness, shortness of breath and wheezing.   Cardiovascular: Negative for chest pain, palpitations and leg swelling.       Objective:   Physical Exam Vitals:   09/05/16 0940  BP: 128/72  Pulse: 63  SpO2: 99%  Weight: 162 lb 9.6 oz (73.8 kg)  Height: 6' 1.5" (1.867 m)   RA  Gen: well appearing HENT: OP clear, TM's clear, neck supple PULM: CTA B, normal percussion CV: RRR, no mgr, trace edema GI: BS+, soft, nontender Derm: no cyanosis or rash Psyche: normal mood and affect   PFT reviewed, see above  CBC    Component Value Date/Time   WBC 5.6 07/06/2016 0428   RBC 4.62 07/06/2016 0428   HGB 13.7 07/06/2016 0428   HCT 42.4 07/06/2016 0428   PLT 175 07/06/2016 0428   MCV 91.8 07/06/2016 0428   MCH 29.7 07/06/2016 0428   MCHC 32.3 07/06/2016 0428   RDW 13.0 07/06/2016  0428   LYMPHSABS 2.4 07/06/2016 0428   MONOABS 0.3 07/06/2016 0428   EOSABS 0.1 07/06/2016 0428   BASOSABS 0.0 07/06/2016 0428       Assessment & Plan:  Asthma, chronic Austin Curry has moderate persistent asthma based on his pulmonary function testing which shows reversible airflow obstruction and his quite frequent albuterol use. Fortunately he has not had an exacerbation since the last visit but his symptoms are still not well controlled based on the frequency of albuterol use.  Plan: Stop Qvar Start reo 1 puff daily Use albuterol as needed Flu shot today Follow-up 3 months  > 50% of today's office visit spent face to face, 27 minute visit  Current Outpatient Prescriptions:  .  albuterol (PROVENTIL) (2.5 MG/3ML) 0.083% nebulizer solution, Take 3 mLs (2.5 mg total) by nebulization every 4 (four) hours as needed for wheezing or shortness of breath., Disp: 30 vial, Rfl: 0 .  fluticasone (FLONASE) 50 MCG/ACT nasal spray, Place 1 spray into both nostrils daily. (Patient taking differently: Place 1 spray into both nostrils daily as needed for allergies. ), Disp: 16 g, Rfl: 2 .  HYDROcodone-acetaminophen (NORCO) 5-325 MG tablet, Take 1-2 tablets by mouth every 4 (four) hours as needed for moderate pain., Disp: 40 tablet, Rfl: 0 .  ibuprofen (ADVIL,MOTRIN) 200 MG tablet,  Take 400 mg by mouth every 8 (eight) hours as needed (for pain.)., Disp: , Rfl:  .  oxyCODONE-acetaminophen (PERCOCET) 5-325 MG tablet, Take 1 tablet by mouth every 4 (four) hours as needed for moderate pain., Disp: 15 tablet, Rfl: 0 .  pantoprazole (PROTONIX) 40 MG tablet, Take 1 tablet (40 mg total) by mouth daily., Disp: 15 tablet, Rfl: 0 .  Spacer/Aero-Holding Chambers (AEROCHAMBER MV) inhaler, Use as instructed, Disp: 1 each, Rfl: 0  Current Facility-Administered Medications:  .  fluticasone furoate-vilanterol (BREO ELLIPTA) 200-25 MCG/INH 1 puff, 1 puff, Inhalation, Daily, Lupita Leash, MD

## 2016-09-05 NOTE — Patient Instructions (Signed)
Take Breo instead of Qvar, he will take Breo 1 puff daily Use albuterol as needed for shortness of breath Keep the appointment with me 3 months from now, otherwise come back to see me sooner if her shortness of breath is not improving

## 2016-09-05 NOTE — Assessment & Plan Note (Signed)
Austin Curry has moderate persistent asthma based on his pulmonary function testing which shows reversible airflow obstruction and his quite frequent albuterol use. Fortunately he has not had an exacerbation since the last visit but his symptoms are still not well controlled based on the frequency of albuterol use.  Plan: Stop Qvar Start reo 1 puff daily Use albuterol as needed Flu shot today Follow-up 3 months

## 2016-09-19 ENCOUNTER — Encounter (HOSPITAL_COMMUNITY): Payer: Self-pay

## 2016-09-26 ENCOUNTER — Encounter: Payer: Self-pay | Admitting: Acute Care

## 2016-09-26 ENCOUNTER — Telehealth: Payer: Self-pay | Admitting: Acute Care

## 2016-09-26 ENCOUNTER — Encounter: Payer: Self-pay | Admitting: Emergency Medicine

## 2016-09-26 ENCOUNTER — Ambulatory Visit (INDEPENDENT_AMBULATORY_CARE_PROVIDER_SITE_OTHER)
Admission: RE | Admit: 2016-09-26 | Discharge: 2016-09-26 | Disposition: A | Discharge status: Home / Self Care | Payer: Self-pay | Source: Ambulatory Visit | Attending: Acute Care | Admitting: Acute Care

## 2016-09-26 ENCOUNTER — Telehealth: Payer: Self-pay | Admitting: *Deleted

## 2016-09-26 ENCOUNTER — Ambulatory Visit (INDEPENDENT_AMBULATORY_CARE_PROVIDER_SITE_OTHER): Payer: Self-pay | Admitting: Acute Care

## 2016-09-26 DIAGNOSIS — J4541 Moderate persistent asthma with (acute) exacerbation: Secondary | ICD-10-CM

## 2016-09-26 LAB — NITRIC OXIDE: Nitric Oxide: 7

## 2016-09-26 MED ORDER — ALBUTEROL SULFATE HFA 108 (90 BASE) MCG/ACT IN AERS: 1.0000 | INHALATION_SPRAY | Freq: Four times a day (QID) | RESPIRATORY_TRACT | 5 refills | Status: DC | PRN

## 2016-09-26 MED ORDER — PREDNISONE 10 MG PO TABS: ORAL_TABLET | ORAL | 0 refills | Status: DC

## 2016-09-26 MED ORDER — FLUTICASONE FUROATE-VILANTEROL 100-25 MCG/INH IN AEPB: 1.0000 | INHALATION_SPRAY | Freq: Every day | RESPIRATORY_TRACT | 11 refills | Status: DC

## 2016-09-26 MED ORDER — ALBUTEROL SULFATE (2.5 MG/3ML) 0.083% IN NEBU: 2.5000 mg | INHALATION_SOLUTION | RESPIRATORY_TRACT | 0 refills | Status: DC | PRN

## 2016-09-26 MED ORDER — AZITHROMYCIN 250 MG PO TABS: ORAL_TABLET | ORAL | 0 refills | Status: DC

## 2016-09-26 NOTE — Patient Instructions (Addendum)
It is nice to meet you today. CXR today. We will call you with the results. Z-Pack. Claritin 10 mg once daily for allergies. ( Generic medication is fine) FENO today. Prednisone taper; 10 mg tablets:2 tabs x 2 days, 1 tab x 2 days then stop. Continue using BREO  Once daily very day without fail . This is your maintenance medication. Rinse your mouth after use. Continue using your Albuterol inhaler and nebs. We will refill your  neb prescription. We will [facility] that [facility] come and check your machine and tubing. Follow up in 2 weeks with Dr. Kendrick FriesMcQuaid or NP to make sure you are better. Please contact office for sooner follow up if symptoms do not improve or worsen or seek emergency care

## 2016-09-26 NOTE — Telephone Encounter (Signed)
Left message on voicemail to call office. Following up on call to see if pt needs an appt?     PLEASE NOTE: All timestamps contained within this report are represented as [place] Standard Time. CONFIDENTIALTY NOTICE: This fax transmission is intended only for the addressee. It contains information that is legally privileged, confidential or otherwise protected from use or disclosure. If you are not the intended recipient, you are strictly prohibited from reviewing, disclosing, copying using or disseminating any of this information or taking any action in reliance on or regarding this information. If you have received this fax in error, please notify us immediately by telephone so that we can arrange for its return to us. Phone: [phone], Toll-Free: [phone], Fax: [phone] Page: 1 of 2 Call Id: [id number] [facility] Night - Client TELEPHONE ADVICE RECORD [facility] Patient Name: Austin Curry Gender: Male DOB: Nov 07, 1981 Age: [age] Y 8 M 19 D Return Phone Number: [phone] (Primary) Address: City/State/Zip: [place] Client [facility] - Client Client Site [facility] - Night Physician Derryl HarborKwiatkowski, Pete - MD Contact Type Call Who Is Calling Patient / Member / Family / Caregiver Call Type Triage / Clinical Relationship To Patient Self Return Phone Number [phone] (Primary) Chief Complaint BREATHING - shortness of breath or sounds breathless Reason for Call Symptomatic / Request for Health Information Initial Comment Caller says, wants to know if he needs to see a pulmonary Dr or his PCP for asthma related Sx. , breathing difficulty now PreDisposition Call Doctor Translation No Nurse Assessment Nurse: Scarlette ArStandifer, RN, Heather Date/Time (Eastern Time): 09/26/2016 7:09:28 AM Confirm and document reason for call. If symptomatic, describe symptoms. You must click the next button to save  text entered. ---Caller states that he started with asthma symptoms yesterday that got worse last night, with mild diff breathing now. Has the patient traveled out of the country within the last 30 days? ---Not Applicable Does the patient have any new or worsening symptoms? ---Yes Will a triage be completed? ---Yes Related visit to physician within the last 2 weeks? ---No Does the PT have any chronic conditions? (i.e. diabetes, asthma, etc.) ---Yes List chronic conditions. ---asthma Is this a behavioral health or substance abuse call? ---No Guidelines Guideline Title Affirmed Question Affirmed Notes Nurse Date/Time (Eastern Time) Asthma Attack [1] Coughing continuously (nonstop) AND [2] keeps from working or sleeping AND [3] not improved after 2 nebulizer or inhaler treatments given 20 minutes apart Standifer, RN, Heather 09/26/2016 7:10:40 AM PLEASE NOTE: All timestamps contained within this report are represented as [place] Standard Time. CONFIDENTIALTY NOTICE: This fax transmission is intended only for the addressee. It contains information that is legally privileged, confidential or otherwise protected from use or disclosure. If you are not the intended recipient, you are strictly prohibited from reviewing, disclosing, copying using or disseminating any of this information or taking any action in reliance on or regarding this information. If you have received this fax in error, please notify us immediately by telephone so that we can arrange for its return to us. Phone: [phone], Toll-Free: [phone], Fax: [phone] Page: 2 of 2 Call Id: [id number] Disp. Time Lamount Cohen(Eastern Time) Disposition Final User 09/26/2016 7:07:55 AM Send to Urgent Queue Delana MeyerHodges, Ronald 09/26/2016 7:15:45 AM Call Completed Standifer, RN, Herbert SetaHeather 09/26/2016 7:15:11 AM See Physician within 4 Hours (or PCP triage) Yes Standifer, RN, Sibyl ParrHeather Caller Understands: Yes Disagree/Comply: Comply Care Advice  Given Per Guideline SEE PHYSICIAN WITHIN 4 HOURS (or PCP triage): CALL  BACK IF: * You become worse. CARE ADVICE given per Asthma Attack (Adult) guideline. Referrals REFERRED TO PCP OFFICE

## 2016-09-26 NOTE — Assessment & Plan Note (Addendum)
Exacerbation of moderate to persistent asthma with fever. Non-compliance with his maintenance medication ( Breo) Plan: CXR today. We will call you with the results. Z-Pack. Claritin 10 mg once daily for allergies. ( Generic medication is fine) FENO today. Prednisone taper; 10 mg tablets: 2 tabs x 2 days, 1 tabs x 2 days,  then stop. Continue using BREO  Once daily every day without fail . This is your maintenance medication. Rinse your mouth after use. Continue using your Albuterol inhaler and nebs. We will refill your  neb prescription. [facility] come and check your machine and tubing. Follow up in 2 weeks with Dr. Kendrick FriesMcQuaid or NP to make sure you are better. Please contact office for sooner follow up if symptoms do not improve or worsen or seek emergency care

## 2016-09-26 NOTE — Progress Notes (Signed)
History of Present Illness Austin Curry is a 35 y.o. male with moderate persistent Asthma followed by Dr. Kendrick FriesMcQuaid.  Synopsis: Referred in 2017 for moderate persistent asthma. 08/26/2016 pulmonary function test ratio 67%, FEV1 3.38 L 80% predicted 17% change to 3.97 L 94% predicted, total lung capacity elevated, DLCO 41.4 109% predicted  09/26/2016 Acute OV: Pt. Presents to the office today for an Acute OV for asthma exacerbation. He was seen in the office 9/11 by Dr. Kendrick FriesMcQuaid, and was started on Breo once daily. He has not been compliant with his maintenance medication use. He is using his rescue inhaler about twice daily. He states that he had tightness in his chest, and started coughing.He became anxious. He did try using his neb machine, but it would not work. He thinks there was something wrong with his neb tubing, and he didn't get the mist. He is out of his neb treatments.He feels this exacerbation may have been triggered by the change in temperature. He is surprised he is having a flare at this time of year as he usually has these issues in the Spring.He states cough is productive for clear secretions. He denies chest pain, orthopnea or hemoptysis. He has had 2 episodes of night sweats, on Friday and Sunday, and chills with the night sweats.   Tests  FENO:>> 09/26/2016 >> 7 CXR >> Pending  Past medical hx Past Medical History:  Diagnosis Date  . Asthma   . Hernia      Past surgical hx, Family hx, Social hx all reviewed.  Current Outpatient Prescriptions on File Prior to Visit  Medication Sig  . fluticasone (FLONASE) 50 MCG/ACT nasal spray Place 1 spray into both nostrils daily. (Patient taking differently: Place 1 spray into both nostrils daily as needed for allergies. )  . ibuprofen (ADVIL,MOTRIN) 200 MG tablet Take 400 mg by mouth every 8 (eight) hours as needed (for pain.).  [name]. pantoprazole (PROTONIX) 40 MG tablet Take 1 tablet (40 mg total) by mouth daily.  [name].  Spacer/Aero-Holding Chambers (AEROCHAMBER MV) inhaler Use as instructed  . HYDROcodone-acetaminophen (NORCO) 5-325 MG tablet Take 1-2 tablets by mouth every 4 (four) hours as needed for moderate pain. (Patient not taking: Reported on 09/26/2016)  . oxyCODONE-acetaminophen (PERCOCET) 5-325 MG tablet Take 1 tablet by mouth every 4 (four) hours as needed for moderate pain. (Patient not taking: Reported on 09/26/2016)   Current Facility-Administered Medications on File Prior to Visit  Medication  . fluticasone furoate-vilanterol (BREO ELLIPTA) 200-25 MCG/INH 1 puff     No Known Allergies  Review Of Systems:  Constitutional:   No  weight loss, night sweats,  Fevers, chills, fatigue, or  lassitude.  HEENT:   No headaches,  Difficulty swallowing,  Tooth/dental problems, or  Sore throat,                No sneezing, itching, ear ache, nasal congestion, +post nasal drip,   CV:  No chest pain,  Orthopnea, PND, swelling in lower extremities, anasarca, dizziness, palpitations, syncope.   GI  No heartburn, indigestion, abdominal pain, nausea, vomiting, diarrhea, change in bowel habits, loss of appetite, bloody stools.   Resp: + shortness of breath with exertion or at rest.  + excess mucus, + productive cough,  + non-productive cough,  No coughing up of blood.  No change in color of mucus.  N+wheezing.  No chest wall deformity  Skin: no rash or lesions.  GU: no dysuria, change in color of urine, no urgency or  frequency.  No flank pain, no hematuria   MS:  No joint pain or swelling.  No decreased range of motion.  No back pain.  Psych:  No change in mood or affect. No depression or anxiety.  No memory loss.   Vital Signs BP 118/68 (BP Location: Left Arm, Cuff Size: Normal)   Pulse 71   Ht 6' 1.5" (1.867 m)   Wt 163 lb (73.9 kg)   SpO2 98%   BMI 21.21 kg/m    Physical Exam:  General- No distress,  A&Ox3, pleasant and polite ENT: No sinus tenderness, TM clear, pale nasal mucosa, no oral  exudate,no post nasal drip, no LAN Cardiac: S1, S2, regular rate and rhythm, no murmur Chest: + wheeze/ rales/ dullness; no accessory muscle use, no nasal flaring, no sternal retractions Abd.: Soft Non-tender Ext: No clubbing cyanosis, edema Neuro:  normal strength Skin: No rashes, warm and dry Psych: normal mood and behavior   Assessment/Plan  Asthma exacerbation Exacerbation of moderate to persistent asthma with fever. Non-compliance with his maintenance medication ( Breo) Plan: CXR today. We will call you with the results. Z-Pack. Claritin 10 mg once daily for allergies. ( Generic medication is fine) FENO today. Prednisone taper; 10 mg tablets: 2 tabs x 2 days, 1 tabs x 2 days,  then stop. Continue using BREO  Once daily every day without fail . This is your maintenance medication. Rinse your mouth after use. Continue using your Albuterol inhaler and nebs. We will refill your  neb prescription. [facility] come and check your machine and tubing. Follow up in 2 weeks with Dr. Kendrick Fries or NP to make sure you are better. Please contact office for sooner follow up if symptoms do not improve or worsen or seek emergency care                      Bevelyn Ngo, NP 09/26/2016  1:28 PM

## 2016-09-26 NOTE — Telephone Encounter (Signed)
Pt is requesting CXR results.  SG - Please advise. Thanks!

## 2016-09-26 NOTE — Telephone Encounter (Signed)
I have called Mr. Austin Curry with the results of his CXR, which indicated : Mild hyperinflation consistent with known reactive airway disease. Coarse interstitial lung markings are stable and consistent with the smoking history. There is no pneumonia nor other acute cardiopulmonary abnormality. I explained that he should take the prednisone and z pack as directed earlier, and to call the office  if he does not feel better. He verbalized understanding.

## 2016-09-26 NOTE — Telephone Encounter (Signed)
Pt called back, said he got an appt today to see Pulmonary about his asthma. Told him okay good, just wanted to make sure he was being seen today by someone. Take care. Pt verbalized understanding.

## 2016-09-28 ENCOUNTER — Encounter (HOSPITAL_COMMUNITY): Payer: Self-pay

## 2016-09-28 ENCOUNTER — Emergency Department (HOSPITAL_COMMUNITY): Payer: Self-pay

## 2016-09-28 ENCOUNTER — Emergency Department (HOSPITAL_COMMUNITY)
Admission: EM | Admit: 2016-09-28 | Discharge: 2016-09-28 | Disposition: A | Discharge status: Home / Self Care | Payer: Self-pay | Attending: Emergency Medicine | Admitting: Emergency Medicine

## 2016-09-28 DIAGNOSIS — Z87891 Personal history of nicotine dependence: Secondary | ICD-10-CM | POA: Insufficient documentation

## 2016-09-28 DIAGNOSIS — R1011 Right upper quadrant pain: Secondary | ICD-10-CM | POA: Insufficient documentation

## 2016-09-28 DIAGNOSIS — J45909 Unspecified asthma, uncomplicated: Secondary | ICD-10-CM | POA: Insufficient documentation

## 2016-09-28 DIAGNOSIS — Z79899 Other long term (current) drug therapy: Secondary | ICD-10-CM | POA: Insufficient documentation

## 2016-09-28 LAB — COMPREHENSIVE METABOLIC PANEL
ALT: 9 U/L — AB (ref 17–63)
AST: 17 U/L (ref 15–41)
Albumin: 4.1 g/dL (ref 3.5–5.0)
Alkaline Phosphatase: 44 U/L (ref 38–126)
Anion gap: 6 (ref 5–15)
BUN: 12 mg/dL (ref 6–20)
CALCIUM: 9.2 mg/dL (ref 8.9–10.3)
CHLORIDE: 105 mmol/L (ref 101–111)
CO2: 27 mmol/L (ref 22–32)
CREATININE: 0.99 mg/dL (ref 0.61–1.24)
GFR calc Af Amer: >60 mL/min (ref >60)
GLUCOSE: 90 mg/dL (ref 65–99)
Potassium: 4.3 mmol/L (ref 3.5–5.1)
Sodium: 138 mmol/L (ref 135–145)
TOTAL PROTEIN: 7 g/dL (ref 6.5–8.1)
Total Bilirubin: 0.6 mg/dL (ref 0.3–1.2)

## 2016-09-28 LAB — URINALYSIS, ROUTINE W REFLEX MICROSCOPIC
BILIRUBIN URINE: NEGATIVE
Glucose, UA: NEGATIVE mg/dL
HGB URINE DIPSTICK: NEGATIVE
Ketones, ur: NEGATIVE mg/dL
Leukocytes, UA: NEGATIVE
Nitrite: NEGATIVE
PROTEIN: NEGATIVE mg/dL
Specific Gravity, Urine: 1.011 (ref 1.005–1.030)
pH: 7 (ref 5.0–8.0)

## 2016-09-28 LAB — CBC
HCT: 45.8 % (ref 39.0–52.0)
Hemoglobin: 14.8 g/dL (ref 13.0–17.0)
MCH: 29.3 pg (ref 26.0–34.0)
MCHC: 32.3 g/dL (ref 30.0–36.0)
MCV: 90.7 fL (ref 78.0–100.0)
PLATELETS: 183 10*3/uL (ref 150–400)
RBC: 5.05 MIL/uL (ref 4.22–5.81)
RDW: 13 % (ref 11.5–15.5)
WBC: 5.8 10*3/uL (ref 4.0–10.5)

## 2016-09-28 LAB — LIPASE, BLOOD: LIPASE: 17 U/L (ref 11–51)

## 2016-09-28 MED ORDER — PANTOPRAZOLE SODIUM 40 MG PO TBEC: 40.0000 mg | DELAYED_RELEASE_TABLET | Freq: Every day | ORAL | 0 refills | Status: DC

## 2016-09-28 NOTE — ED Provider Notes (Signed)
[facility]-EMERGENCY DEPT Provider Note   CSN: [id number] Arrival date & time: 09/28/16  0820     History   Chief Complaint Chief Complaint  Patient presents with  . Abdominal Pain    HPI Austin Curry is a 35 y.o. male.  The history is provided by the patient and medical records. No language interpreter was used.  Abdominal Pain   Associated symptoms include nausea. Pertinent negatives include fever, diarrhea, vomiting, constipation, dysuria and headaches.   Austin Curry is a 35 y.o. male  with a PMH of ventral hernia repaired in April 2014 who presents to the Emergency Department complaining of intermittent non-radiating upper abdominal pain described as tightness which occurs about every other day. Patient states that pain has been off and on for the last two weeks, but hurt much more than usual this morning. Pain is worse after eating. Endorses intermittent nausea. No vomiting, diarrhea, constipation, fevers, chest pain, back pain, shortness of breath. Seen in ED one month ago for abdominal pain as well where CT was obtained and negative. He was given rx for Protonix which he states provided relief, but he is now out of this medication. No other medications taken PTA for symptoms.  Past Medical History:  Diagnosis Date  . Asthma   . Hernia     Patient Active Problem List   Diagnosis Date Noted  . Status asthmaticus 04/08/2016  . Asthma exacerbation 04/08/2016  . Ventral hernia 01/12/2016  . Asthma, chronic 06/25/2013    Past Surgical History:  Procedure Laterality Date  . INSERTION OF MESH N/A 04/18/2016   Procedure: INSERTION OF MESH;  Surgeon: Abigail Miyamoto, MD;  Location: [facility];  Service: General;  Laterality: N/A;  . VENTRAL HERNIA REPAIR N/A 04/18/2016   Procedure:  VENTRAL HERNIA REPAIR;  Surgeon: Abigail Miyamoto, MD;  Location: [facility];  Service: General;  Laterality: N/A;       Home Medications    Prior to Admission medications   Medication Sig Start  Date End Date Taking? Authorizing Provider  albuterol (PROVENTIL HFA;VENTOLIN HFA) 108 (90 Base) MCG/ACT inhaler Inhale 1-2 puffs into the lungs every 6 (six) hours as needed for wheezing or shortness of breath. 09/26/16  Yes Bevelyn Ngo, NP  albuterol (PROVENTIL) (2.5 MG/3ML) 0.083% nebulizer solution Take 3 mLs (2.5 mg total) by nebulization every 4 (four) hours as needed for wheezing or shortness of breath. 09/26/16  Yes Bevelyn Ngo, NP  fluticasone (FLONASE) 50 MCG/ACT nasal spray Place 1 spray into both nostrils daily. Patient taking differently: Place 1 spray into both nostrils daily as needed for allergies.  04/08/16  Yes Jeralyn Bennett, MD  fluticasone furoate-vilanterol (BREO ELLIPTA) 100-25 MCG/INH AEPB Inhale 1 puff into the lungs daily. 09/26/16  Yes Bevelyn Ngo, NP  ibuprofen (ADVIL,MOTRIN) 200 MG tablet Take 400 mg by mouth every 8 (eight) hours as needed (for pain.).   Yes Historical Provider, MD  Spacer/Aero-Holding Chambers (AEROCHAMBER MV) inhaler Use as instructed 05/02/16  Yes Lupita Leash, MD  azithromycin (ZITHROMAX) 250 MG tablet Take as directed 09/26/16   Bevelyn Ngo, NP  pantoprazole (PROTONIX) 40 MG tablet Take 1 tablet (40 mg total) by mouth daily. 09/28/16   Christe Tellez Pilcher Mccauley Diehl, PA-C  predniSONE (DELTASONE) 10 MG tablet Take 2 tabs for 2 days, then take 1 tab for 2 days, then stop. 09/26/16   Bevelyn Ngo, NP    Family History Family History  Problem Relation Age of Onset  . Diabetes  Mother   . Hypertension Mother   . Hyperlipidemia Father   . Asthma Brother     Social History Social History  Substance Use Topics  . Smoking status: Former Smoker    Packs/day: 0.25    Years: 15.00    Types: Cigarettes    Quit date: 12/09/2014  . Smokeless tobacco: Never Used  . Alcohol use 0.0 oz/week     Comment: 1 drink every other month or so, socially     Allergies   Review of patient's allergies indicates no known allergies.   Review of Systems Review of  Systems  Constitutional: Negative for fever.  HENT: Negative for congestion.   Eyes: Negative for visual disturbance.  Respiratory: Negative for cough and shortness of breath.   Cardiovascular: Negative.   Gastrointestinal: Positive for abdominal pain and nausea. Negative for blood in stool, constipation, diarrhea and vomiting.  Genitourinary: Negative for dysuria.  Musculoskeletal: Negative for back pain.  Skin: Negative for rash.  Neurological: Negative for headaches.     Physical Exam Updated Vital Signs BP 111/59 (BP Location: Left Arm)   Pulse 100   Temp 98.4 F (36.9 C) (Oral)   Resp 14   Ht 6\' 1"  (1.854 m)   Wt 74.8 kg   SpO2 100%   BMI 21.77 kg/m   Physical Exam  Constitutional: He is oriented to person, place, and time. He appears well-developed and well-nourished. No distress.  HENT:  Head: Normocephalic and atraumatic.  Cardiovascular: Normal rate, regular rhythm and normal heart sounds.   No murmur heard. Pulmonary/Chest: Effort normal and breath sounds normal. No respiratory distress.  Abdominal: Soft. Bowel sounds are normal. He exhibits no distension.  Tenderness to palpation of right upper quadrant and epigastrium. Negative Murphy's.  Musculoskeletal: Normal range of motion.  Neurological: He is alert and oriented to person, place, and time.  Skin: Skin is warm and dry.  Nursing note and vitals reviewed.    ED Treatments / Results  Labs (all labs ordered are listed, but only abnormal results are displayed) Labs Reviewed  COMPREHENSIVE METABOLIC PANEL - Abnormal; Notable for the following:       Result Value   ALT 9 (*)    All other components within normal limits  LIPASE, BLOOD  CBC  URINALYSIS, ROUTINE W REFLEX MICROSCOPIC (NOT AT [facility])    EKG  EKG Interpretation None       Radiology US Abdomen Limited Ruq  Result Date: 09/28/2016 CLINICAL DATA:  Right upper quadrant pain for 1 week. EXAM: US ABDOMEN LIMITED - RIGHT UPPER QUADRANT  COMPARISON:  None. FINDINGS: Gallbladder: No gallstones or wall thickening visualized. No sonographic Murphy sign noted by sonographer. Common bile duct: Diameter: 1.9 mm Liver: No focal lesion identified. Within normal limits in parenchymal echogenicity. Trace fluid is seen adjacent to the liver and gallbladder. IMPRESSION: 1. Normal gallbladder.  No bile duct dilation.  No acute finding. 2. Trace fluid seen adjacent to the liver and gallbladder, nonspecific. Electronically Signed   By: Amie Portland M.D.   On: 09/28/2016 11:40    Procedures Procedures (including critical care time)  Medications Ordered in ED Medications - No data to display   Initial Impression / Assessment and Plan / ED Course  I have reviewed the triage vital signs and the nursing notes.  Pertinent labs & imaging results that were available during my care of the patient were reviewed by me and considered in my medical decision making (see chart for details).  Clinical  Course   Elmon KirschnerDerek J Reali presents with RUQ abdominal pain intermittently over the last two weeks. Seen in ED for abdominal pain in July as well where CT abdomen was performed and negative. He was started on Protonix at that time which provided relief, but he is now out of this medication. On exam, patient is Afebrile, well-appearing with a nonsurgical abdomen. He has tenderness to palpation of the right upper quadrant and epigastrium. Ultrasound performed which was reassuring as well. Likely gastritis vs. gerd vs peptic ulcer. Rx for protonix. GI follow up. Return precautions given and all questions answered.    Patient seen by and discussed with Dr. Clarene DukeLittle who agrees with treatment plan.    Final Clinical Impressions(s) / ED Diagnoses   Final diagnoses:  RUQ abdominal pain    New Prescriptions New Prescriptions   PANTOPRAZOLE (PROTONIX) 40 MG TABLET    Take 1 tablet (40 mg total) by mouth daily.     Chase PicketJaime Pilcher Troye Hiemstra, PA-C 09/28/16 1250      Laurence Spatesachel Morgan Little, MD 10/01/16 1726

## 2016-09-28 NOTE — ED Notes (Signed)
ED PA at bedside

## 2016-09-28 NOTE — Progress Notes (Signed)
Called and spoke to pt. Informed him of the results per MR. Pt verbalized understanding and denied any further questions or concerns at this time.  

## 2016-09-28 NOTE — Discharge Instructions (Signed)
Take Protonix daily.  Call the GI office listed to schedule a follow up appointment.  Return to ER for new or worsening symptoms, any additional concerns.

## 2016-09-28 NOTE — ED Triage Notes (Signed)
Per [facility]- Sudden on set of RUQ stomach pain without N/V/D. Pt reports "sweating through clothes last night" however not sure of fever. Denies injury or exertion. Denies CP or SOB. C/o of increased fatigue x 2 weeks.

## 2016-10-14 ENCOUNTER — Encounter: Payer: Self-pay | Admitting: Adult Health

## 2016-10-14 ENCOUNTER — Ambulatory Visit (INDEPENDENT_AMBULATORY_CARE_PROVIDER_SITE_OTHER): Payer: Self-pay | Admitting: Adult Health

## 2016-10-14 DIAGNOSIS — J4540 Moderate persistent asthma, uncomplicated: Secondary | ICD-10-CM

## 2016-10-14 NOTE — Patient Instructions (Signed)
Continue on BREO , rinse after use.  Use Albuterol inhaler As needed  For wheezing.  follow up Dr. Kendrick FriesMcQuaid in 3 months and As needed

## 2016-10-14 NOTE — Progress Notes (Signed)
Subjective:    Patient ID: Austin Curry, male    DOB: 18-Mar-1981, 35 y.o.   MRN: [id number]  HPI 35 yo Male former smoker followed for moderate persistent asthma  TEST  08/26/2016 pulmonary function test ratio 67%, FEV1 3.38 L 80% predicted 17% change to 3.97 L 94% predicted, total lung capacity elevated, DLCO 41.4 109% predicted   10/14/2016 Follow up : Asthma  Patient returns for a two-week follow-up. Patient was seen last visit for an asthmatic bronchitic exacerbation. He was given a Z-Pak and prednisone taper. Chest x-ray showed no acute process.. Patient says he has improved. He did not have to take a Z-Pak or prednisone taper. Does have gradually improved on the phone. Patient is out of BREO. He has not had prescription coverage. He is currently applying for insurance through his work through the open enrollment.. Patient denies any chest pain, orthopnea, PND, or increased leg swelling.  Past Medical History:  Diagnosis Date  . Asthma   . Hernia    Current Outpatient Prescriptions on File Prior to Visit  Medication Sig Dispense Refill  . albuterol (PROVENTIL HFA;VENTOLIN HFA) 108 (90 Base) MCG/ACT inhaler Inhale 1-2 puffs into the lungs every 6 (six) hours as needed for wheezing or shortness of breath. 1 Inhaler 5  . albuterol (PROVENTIL) (2.5 MG/3ML) 0.083% nebulizer solution Take 3 mLs (2.5 mg total) by nebulization every 4 (four) hours as needed for wheezing or shortness of breath. 30 vial 0  . fluticasone (FLONASE) 50 MCG/ACT nasal spray Place 1 spray into both nostrils daily. (Patient taking differently: Place 1 spray into both nostrils daily as needed for allergies. ) 16 g 2  . fluticasone furoate-vilanterol (BREO ELLIPTA) 100-25 MCG/INH AEPB Inhale 1 puff into the lungs daily. 60 each 11  . ibuprofen (ADVIL,MOTRIN) 200 MG tablet Take 400 mg by mouth every 8 (eight) hours as needed (for pain.).    [name] pantoprazole (PROTONIX) 40 MG tablet Take 1 tablet (40 mg total) by  mouth daily. 30 tablet 0  . Spacer/Aero-Holding Chambers (AEROCHAMBER MV) inhaler Use as instructed 1 each 0  . azithromycin (ZITHROMAX) 250 MG tablet Take as directed (Patient not taking: Reported on 10/14/2016) 6 tablet 0  . predniSONE (DELTASONE) 10 MG tablet Take 2 tabs for 2 days, then take 1 tab for 2 days, then stop. (Patient not taking: Reported on 10/14/2016) 6 tablet 0   Current Facility-Administered Medications on File Prior to Visit  Medication Dose Route Frequency Provider Last Rate Last Dose  . fluticasone furoate-vilanterol (BREO ELLIPTA) 200-25 MCG/INH 1 puff  1 puff Inhalation Daily Lupita Leash, MD          Review of Systems Constitutional:   No  weight loss, night sweats,  Fevers, chills, fatigue, or  lassitude.  HEENT:   No headaches,  Difficulty swallowing,  Tooth/dental problems, or  Sore throat,                No sneezing, itching, ear ache, nasal congestion, post nasal drip,   CV:  No chest pain,  Orthopnea, PND, swelling in lower extremities, anasarca, dizziness, palpitations, syncope.   GI  No heartburn, indigestion, abdominal pain, nausea, vomiting, diarrhea, change in bowel habits, loss of appetite, bloody stools.   Resp: No shortness of breath with exertion or at rest.  No excess mucus, no productive cough,  No non-productive cough,  No coughing up of blood.  No change in color of mucus.  No wheezing.  No chest wall  deformity  Skin: no rash or lesions.  GU: no dysuria, change in color of urine, no urgency or frequency.  No flank pain, no hematuria   MS:  No joint pain or swelling.  No decreased range of motion.  No back pain.  Psych:  No change in mood or affect. No depression or anxiety.  No memory loss.         Objective:   Physical Exam Vitals:   10/14/16 1415  BP: 104/68  Pulse: 86  Temp: 98.1 F (36.7 C)  TempSrc: Oral  SpO2: 96%  Weight: 164 lb (74.4 kg)  Height: 6\' 1"  (1.854 m)   .GEN: A/Ox3; pleasant , NAD, well nourished      HEENT:  [place]/AT,  EACs-clear, TMs-wnl, NOSE-clear, THROAT-clear, no lesions, no postnasal drip or exudate noted.   NECK:  Supple w/ fair ROM; no JVD; normal carotid impulses w/o bruits; no thyromegaly or nodules palpated; no lymphadenopathy.    RESP  Clear  P & A; w/o, wheezes/ rales/ or rhonchi. no accessory muscle use, no dullness to percussion  CARD:  RRR, no m/r/g  , no peripheral edema, pulses intact, no cyanosis or clubbing.  GI:   Soft & nt; nml bowel sounds; no organomegaly or masses detected.   Musco: Warm bil, no deformities or joint swelling noted.   Neuro: alert, no focal deficits noted.    Skin: Warm, no lesions or rashes  Jackelin Correia NP-C  [facility]  10/14/2016        Assessment & Plan:

## 2016-10-14 NOTE — Assessment & Plan Note (Signed)
Recent flare , resolved -did not have to take steroids or abx  Plan  Patient Instructions  Continue on BREO , rinse after use.  Use Albuterol inhaler As needed  For wheezing.  follow up Dr. Kendrick FriesMcQuaid in 3 months and As needed

## 2016-10-17 NOTE — Progress Notes (Signed)
Reviewed, agree with this plan of care 

## 2016-12-08 ENCOUNTER — Encounter: Payer: Self-pay | Admitting: Pulmonary Disease

## 2016-12-08 ENCOUNTER — Ambulatory Visit (INDEPENDENT_AMBULATORY_CARE_PROVIDER_SITE_OTHER): Payer: Self-pay | Admitting: Pulmonary Disease

## 2016-12-08 VITALS — BP 132/74 | HR 82 | Ht 73.0 in | Wt 174.0 lb

## 2016-12-08 DIAGNOSIS — J4540 Moderate persistent asthma, uncomplicated: Secondary | ICD-10-CM

## 2016-12-08 NOTE — Progress Notes (Signed)
   Subjective:    Patient ID: Austin Curry, male    DOB: April 18, 1981, 35 y.o.   MRN: [id number]  Synopsis: Referred in 2017 for asthma August 2017 lung function testing showed ratio 67%, FEV1 increased from 3.38 L to 3.97 L after bronchodilator which is a 17% change to 94% predicted, total lung capacity was incorrect, DLCO 41.4-109% predicted  HPI Chief Complaint  Patient presents with  . Follow-up    pt doing well, notes some sinus congestion,PND worse with season changes.     Austin Curry has been OK lately.   He has been taking Breo at home and is taking it daily.  It is really helping his breathing a lot.  No dyspnea, no wheezing.  No rescue inhaler use.  No more ER visits.    Flu shot is up to date.     Past Medical History:  Diagnosis Date  . Asthma   . Hernia       Review of Systems     Objective:   Physical Exam Vitals:   12/08/16 1410  BP: 132/74  Pulse: 82  SpO2: 96%  Weight: 174 lb (78.9 kg)  Height: 6\' 1"  (1.854 m)  RA  Gen: well appearing HENT: OP clear, TM's clear, neck supple PULM: CTA B, normal percussion CV: RRR, no mgr, trace edema GI: BS+, soft, nontender Derm: no cyanosis or rash Psyche: normal mood and affect        Assessment & Plan:  Impression: Moderate persistent asthma  Discussion: This has been a stable interval for Con-wayDerek. His moderate persistent asthma is well-controlled with Breo.  His flu shot is up-to-date  For your asthma: Keep taking Breo as you're doing Use albuterol as needed for shortness of breath Try to stay as active as possible with regular exercise  We will see you back in 6 months or sooner if needed   Current Outpatient Prescriptions:  .  albuterol (PROVENTIL HFA;VENTOLIN HFA) 108 (90 Base) MCG/ACT inhaler, Inhale 1-2 puffs into the lungs every 6 (six) hours as needed for wheezing or shortness of breath., Disp: 1 Inhaler, Rfl: 5 .  albuterol (PROVENTIL) (2.5 MG/3ML) 0.083% nebulizer solution, Take 3 mLs (2.5  mg total) by nebulization every 4 (four) hours as needed for wheezing or shortness of breath., Disp: 30 vial, Rfl: 0 .  fluticasone (FLONASE) 50 MCG/ACT nasal spray, Place 1 spray into both nostrils daily. (Patient taking differently: Place 1 spray into both nostrils daily as needed for allergies. ), Disp: 16 g, Rfl: 2 .  fluticasone furoate-vilanterol (BREO ELLIPTA) 100-25 MCG/INH AEPB, Inhale 1 puff into the lungs daily., Disp: 60 each, Rfl: 11 .  ibuprofen (ADVIL,MOTRIN) 200 MG tablet, Take 400 mg by mouth every 8 (eight) hours as needed (for pain.)., Disp: , Rfl:  .  pantoprazole (PROTONIX) 40 MG tablet, Take 1 tablet (40 mg total) by mouth daily., Disp: 30 tablet, Rfl: 0 .  Spacer/Aero-Holding Chambers (AEROCHAMBER MV) inhaler, Use as instructed, Disp: 1 each, Rfl: 0  Current Facility-Administered Medications:  .  fluticasone furoate-vilanterol (BREO ELLIPTA) 200-25 MCG/INH 1 puff, 1 puff, Inhalation, Daily, Lupita Leashouglas B Xaiver Roskelley, MD

## 2016-12-08 NOTE — Patient Instructions (Signed)
For your asthma: Keep taking Breo as you're doing Use albuterol as needed for shortness of breath Try to stay as active as possible with regular exercise  We will see you back in 6 months or sooner if needed

## 2017-01-13 ENCOUNTER — Encounter (HOSPITAL_COMMUNITY): Payer: Self-pay | Admitting: *Deleted

## 2017-01-13 DIAGNOSIS — Z87891 Personal history of nicotine dependence: Secondary | ICD-10-CM | POA: Insufficient documentation

## 2017-01-13 DIAGNOSIS — J069 Acute upper respiratory infection, unspecified: Secondary | ICD-10-CM | POA: Insufficient documentation

## 2017-01-13 DIAGNOSIS — J45909 Unspecified asthma, uncomplicated: Secondary | ICD-10-CM | POA: Insufficient documentation

## 2017-01-13 NOTE — ED Triage Notes (Signed)
The pt has had a cold and cough since yesterday  hed does not know if he has a temp

## 2017-01-13 NOTE — ED Triage Notes (Signed)
C/o hoarsness yesterday also

## 2017-01-14 ENCOUNTER — Emergency Department (HOSPITAL_COMMUNITY)
Admission: EM | Admit: 2017-01-14 | Discharge: 2017-01-14 | Disposition: A | Discharge status: Home / Self Care | Payer: Self-pay | Attending: Emergency Medicine | Admitting: Emergency Medicine

## 2017-01-14 DIAGNOSIS — B9789 Other viral agents as the cause of diseases classified elsewhere: Secondary | ICD-10-CM

## 2017-01-14 DIAGNOSIS — J069 Acute upper respiratory infection, unspecified: Secondary | ICD-10-CM

## 2017-01-14 MED ORDER — BENZONATATE 100 MG PO CAPS: 100.0000 mg | ORAL_CAPSULE | Freq: Three times a day (TID) | ORAL | 0 refills | Status: DC

## 2017-01-14 NOTE — ED Notes (Signed)
See EDP assessment 

## 2017-01-14 NOTE — Discharge Instructions (Signed)
Use your inhaler as needed and follow up with your doctor. Return here for worsening symptoms.

## 2017-01-14 NOTE — ED Provider Notes (Signed)
[facility]-EMERGENCY DEPT Provider Note   CSN: [id number] Arrival date & time: 01/13/17  2048     History   Chief Complaint Chief Complaint  Patient presents with  . Cough    HPI Austin Curry is a 36 y.o. male who presents to the ED wit a cough that started last night. Patient states that he has a hx of asthma but does not feel like he is wheezing. 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 He did get his flu shot this year.   The history is provided by the patient. No language interpreter was used.  Cough  This is a new problem. The current episode started yesterday. The problem has not changed since onset.The cough is non-productive. There has been no fever. Pertinent negatives include no chest pain, no headaches, no sore throat and no wheezing. He is not a smoker. His past medical history is significant for asthma.    Past Medical History:  Diagnosis Date  . Asthma   . Hernia     Patient Active Problem List   Diagnosis Date Noted  . Status asthmaticus 04/08/2016  . Asthma exacerbation 04/08/2016  . Ventral hernia 01/12/2016  . Asthma, chronic 06/25/2013    Past Surgical History:  Procedure Laterality Date  . INSERTION OF MESH N/A 04/18/2016   Procedure: INSERTION OF MESH;  Surgeon: Abigail Miyamotoouglas Blackman, MD;  Location: [facility];  Service: General;  Laterality: N/A;  . VENTRAL HERNIA REPAIR N/A 04/18/2016   Procedure:  VENTRAL HERNIA REPAIR;  Surgeon: Abigail Miyamotoouglas Blackman, MD;  Location: [facility];  Service: General;  Laterality: N/A;       Home Medications    Prior to Admission medications   Medication Sig Start Date End Date Taking? Authorizing Provider  albuterol (PROVENTIL HFA;VENTOLIN HFA) 108 (90 Base) MCG/ACT inhaler Inhale 1-2 puffs into the lungs every 6 (six) hours as needed for wheezing or shortness of breath. 09/26/16   Bevelyn NgoSarah F Groce, NP  albuterol (PROVENTIL) (2.5 MG/3ML) 0.083% nebulizer solution Take 3 mLs (2.5 mg total) by nebulization every 4 (four)  hours as needed for wheezing or shortness of breath. 09/26/16   Bevelyn NgoSarah F Groce, NP  benzonatate (TESSALON) 100 MG capsule Take 1 capsule (100 mg total) by mouth every 8 (eight) hours. 01/14/17   Nyjah Schwake Orlene OchM Higinio Grow, NP  fluticasone (FLONASE) 50 MCG/ACT nasal spray Place 1 spray into both nostrils daily. Patient taking differently: Place 1 spray into both nostrils daily as needed for allergies.  04/08/16   Jeralyn BennettEzequiel Zamora, MD  fluticasone furoate-vilanterol (BREO ELLIPTA) 100-25 MCG/INH AEPB Inhale 1 puff into the lungs daily. 09/26/16   Bevelyn NgoSarah F Groce, NP  ibuprofen (ADVIL,MOTRIN) 200 MG tablet Take 400 mg by mouth every 8 (eight) hours as needed (for pain.).    Historical Provider, MD  pantoprazole (PROTONIX) 40 MG tablet Take 1 tablet (40 mg total) by mouth daily. 09/28/16   Chase PicketJaime Pilcher Ward, PA-C  Spacer/Aero-Holding Chambers (AEROCHAMBER MV) inhaler Use as instructed 05/02/16   Lupita Leashouglas B McQuaid, MD    Family History Family History  Problem Relation Age of Onset  . Diabetes Mother   . Hypertension Mother   . Hyperlipidemia Father   . Asthma Brother     Social History Social History  Substance Use Topics  . Smoking status: Former Smoker    Packs/day: 0.25    Years: 15.00    Types: Cigarettes    Quit date: 12/09/2014  . Smokeless tobacco: Never Used  .  Alcohol use 0.0 oz/week     Comment: 1 drink every other month or so, socially     Allergies   Patient has no known allergies.   Review of Systems Review of Systems  Constitutional: Negative for fever.  HENT: Positive for congestion and postnasal drip. Negative for sore throat.   Respiratory: Positive for cough. Negative for wheezing.   Cardiovascular: Negative for chest pain.  Gastrointestinal: Negative for abdominal pain, nausea and vomiting.  Musculoskeletal: Negative for back pain.  Skin: Negative for rash.  Neurological: Negative for headaches.     Physical Exam Updated Vital Signs BP 121/77   Pulse 84   Temp 98.4 F  (36.9 C)   Resp 16   Ht 6\' 1"  (1.854 m)   Wt 77.3 kg   SpO2 99%   BMI 22.49 kg/m   Physical Exam  Constitutional: He is oriented to person, place, and time. He appears well-developed and well-nourished. No distress.  HENT:  Head: Normocephalic and atraumatic.  Nose: Rhinorrhea present.  Mouth/Throat: Uvula is midline, oropharynx is clear and moist and mucous membranes are normal.  Eyes: EOM are normal.  Neck: Normal range of motion. Neck supple.  Cardiovascular: Normal rate and regular rhythm.   Pulmonary/Chest: Effort normal. No respiratory distress. He has no wheezes. He has no rales.  Abdominal: Soft. There is no tenderness.  Musculoskeletal: Normal range of motion.  Neurological: He is alert and oriented to person, place, and time.  Skin: Skin is warm and dry.  Psychiatric: He has a normal mood and affect. His behavior is normal.  Nursing note and vitals reviewed.    ED Treatments / Results  Labs (all labs ordered are listed, but only abnormal results are displayed) Labs Reviewed - No data to display  Radiology No results found.  Procedures Procedures (including critical care time)  Medications Ordered in ED Medications - No data to display   Initial Impression / Assessment and Plan / ED Course  I have reviewed the triage vital signs and the nursing notes.    Final Clinical Impressions(s) / ED Diagnoses   Final diagnoses:  Viral URI with cough    New Prescriptions New Prescriptions   BENZONATATE (TESSALON) 100 MG CAPSULE    Take 1 capsule (100 mg total) by mouth every 8 (eight) hours.     [address] [place], NP 01/14/17 Josefa Half    Tomasita Crumble, MD 01/14/17 1255

## 2017-01-16 ENCOUNTER — Encounter: Payer: Self-pay | Admitting: Internal Medicine

## 2017-01-16 ENCOUNTER — Ambulatory Visit (INDEPENDENT_AMBULATORY_CARE_PROVIDER_SITE_OTHER): Payer: Self-pay | Admitting: Internal Medicine

## 2017-01-16 VITALS — BP 142/62 | HR 88 | Temp 98.3°F | Ht 73.0 in | Wt 170.0 lb

## 2017-01-16 DIAGNOSIS — J4520 Mild intermittent asthma, uncomplicated: Secondary | ICD-10-CM

## 2017-01-16 DIAGNOSIS — J069 Acute upper respiratory infection, unspecified: Secondary | ICD-10-CM

## 2017-01-16 DIAGNOSIS — B9789 Other viral agents as the cause of diseases classified elsewhere: Secondary | ICD-10-CM

## 2017-01-16 NOTE — Patient Instructions (Addendum)
Acute bronchitis symptoms for less than 10 days are generally not helped by antibiotics.  Take over-the-counter expectorants and cough medications such as  Mucinex DM.  Call if there is no improvement in 5 to 7 days or if  you develop worsening cough, fever, or new symptoms, such as shortness of breath or chest pain.  Hydrate and Humidify  Drink enough water to keep your urine clear or pale yellow. Staying hydrated will help to thin your mucus.  Use a cool mist humidifier to keep the humidity level in your home above 50%.  Inhale steam for 10-15 minutes, 3-4 times a day or as told by your health care provider. You can do this in the bathroom while a hot shower is running.  Limit your exposure to cool or dry air. Rest  Rest as much as possible.  Sleep with your head raised (elevated).  Make sure to get enough sleep each night.  

## 2017-01-16 NOTE — Progress Notes (Signed)
Subjective:    Patient ID: Austin Curry, male    DOB: 1981/10/09, 36 y.o.   MRN: [id number]  HPI  36 year old patient who presents with a several day history of hoarseness, cough, congestion, and general sense of unwellness.  He does have a history of asthma but remains on maintenance medication and has had no significant wheezing.  No fever, chills.  He was out of work Saturday and Sunday but return to work earlier today.  He still feels unwell with refractory cough.  Hoarseness is still an issue.  Past Medical History:  Diagnosis Date  . Asthma   . Hernia      Social History   Social History  . Marital status: Single    Spouse name: N/A  . Number of children: N/A  . Years of education: N/A   Occupational History  . Not on file.   Social History Main Topics  . Smoking status: Former Smoker    Packs/day: 0.25    Years: 15.00    Types: Cigarettes    Quit date: 12/09/2014  . Smokeless tobacco: Never Used  . Alcohol use 0.0 oz/week     Comment: 1 drink every other month or so, socially  . Drug use: No     Comment: Stopped Marjuiana 03/24/2014  . Sexual activity: Not on file   Other Topics Concern  . Not on file   Social History Narrative  . No narrative on file    Past Surgical History:  Procedure Laterality Date  . INSERTION OF MESH N/A 04/18/2016   Procedure: INSERTION OF MESH;  Surgeon: Abigail Miyamotoouglas Blackman, MD;  Location: [facility];  Service: General;  Laterality: N/A;  . VENTRAL HERNIA REPAIR N/A 04/18/2016   Procedure:  VENTRAL HERNIA REPAIR;  Surgeon: Abigail Miyamotoouglas Blackman, MD;  Location: [facility];  Service: General;  Laterality: N/A;    Family History  Problem Relation Age of Onset  . Diabetes Mother   . Hypertension Mother   . Hyperlipidemia Father   . Asthma Brother     No Known Allergies  Current Outpatient Prescriptions on File Prior to Visit  Medication Sig Dispense Refill  . albuterol (PROVENTIL HFA;VENTOLIN HFA) 108 (90 Base) MCG/ACT inhaler Inhale 1-2  puffs into the lungs every 6 (six) hours as needed for wheezing or shortness of breath. 1 Inhaler 5  . albuterol (PROVENTIL) (2.5 MG/3ML) 0.083% nebulizer solution Take 3 mLs (2.5 mg total) by nebulization every 4 (four) hours as needed for wheezing or shortness of breath. 30 vial 0  . benzonatate (TESSALON) 100 MG capsule Take 1 capsule (100 mg total) by mouth every 8 (eight) hours. 21 capsule 0  . fluticasone (FLONASE) 50 MCG/ACT nasal spray Place 1 spray into both nostrils daily. (Patient taking differently: Place 1 spray into both nostrils daily as needed for allergies. ) 16 g 2  . fluticasone furoate-vilanterol (BREO ELLIPTA) 100-25 MCG/INH AEPB Inhale 1 puff into the lungs daily. 60 each 11  . ibuprofen (ADVIL,MOTRIN) 200 MG tablet Take 400 mg by mouth every 8 (eight) hours as needed (for pain.).    [name]. pantoprazole (PROTONIX) 40 MG tablet Take 1 tablet (40 mg total) by mouth daily. 30 tablet 0  . Spacer/Aero-Holding Chambers (AEROCHAMBER MV) inhaler Use as instructed 1 each 0   Current Facility-Administered Medications on File Prior to Visit  Medication Dose Route Frequency Provider Last Rate Last Dose  . fluticasone furoate-vilanterol (BREO ELLIPTA) 200-25 MCG/INH 1 puff  1 puff Inhalation Daily Lupita Leashouglas B McQuaid,  MD        BP (!) 142/62 (BP Location: Right Arm, Patient Position: Sitting, Cuff Size: Normal)   Pulse 88   Temp 98.3 F (36.8 C) (Oral)   Ht 6\' 1"  (1.854 m)   Wt 170 lb (77.1 kg)   SpO2 98%   BMI 22.43 kg/m     Review of Systems  Constitutional: Positive for activity change, appetite change and fatigue. Negative for chills and fever.  HENT: Positive for voice change. Negative for congestion, dental problem, ear pain, hearing loss, sore throat, tinnitus and trouble swallowing.   Eyes: Negative for pain, discharge and visual disturbance.  Respiratory: Positive for cough. Negative for chest tightness, wheezing and stridor.   Cardiovascular: Negative for chest pain,  palpitations and leg swelling.  Gastrointestinal: Negative for abdominal distention, abdominal pain, blood in stool, constipation, diarrhea, nausea and vomiting.  Genitourinary: Negative for difficulty urinating, discharge, flank pain, genital sores, hematuria and urgency.  Musculoskeletal: Negative for arthralgias, back pain, gait problem, joint swelling, myalgias and neck stiffness.  Skin: Negative for rash.  Neurological: Negative for dizziness, syncope, speech difficulty, weakness, numbness and headaches.  Hematological: Negative for adenopathy. Does not bruise/bleed easily.  Psychiatric/Behavioral: Negative for behavioral problems and dysphoric mood. The patient is not nervous/anxious.        Objective:   Physical Exam  Constitutional: He is oriented to person, place, and time. He appears well-developed.  No distress Hoarse  HENT:  Head: Normocephalic.  Right Ear: External ear normal.  Left Ear: External ear normal.  Eyes: Conjunctivae and EOM are normal.  Neck: Normal range of motion.  Cardiovascular: Normal rate and normal heart sounds.   Pulmonary/Chest: Breath sounds normal. No respiratory distress. He has no wheezes. He has no rales.  Abdominal: Bowel sounds are normal.  Musculoskeletal: Normal range of motion. He exhibits no edema or tenderness.  Neurological: He is alert and oriented to person, place, and time.  Psychiatric: He has a normal mood and affect. His behavior is normal.          Assessment & Plan:   Viral URI with cough.  Will treat symptomatically.  Will return to work if improved on Thursday, January 25 Chronic asthma, stable  Rogelia Boga

## 2017-01-16 NOTE — Progress Notes (Signed)
Pre visit review using our clinic review tool, if applicable. No additional management support is needed unless otherwise documented below in the visit note. 

## 2017-02-27 ENCOUNTER — Ambulatory Visit (INDEPENDENT_AMBULATORY_CARE_PROVIDER_SITE_OTHER): Payer: Self-pay | Admitting: Internal Medicine

## 2017-02-27 ENCOUNTER — Encounter: Payer: Self-pay | Admitting: *Deleted

## 2017-02-27 ENCOUNTER — Other Ambulatory Visit (INDEPENDENT_AMBULATORY_CARE_PROVIDER_SITE_OTHER): Payer: Self-pay

## 2017-02-27 ENCOUNTER — Encounter: Payer: Self-pay | Admitting: Internal Medicine

## 2017-02-27 VITALS — BP 110/64 | HR 96 | Ht 73.0 in | Wt 176.0 lb

## 2017-02-27 DIAGNOSIS — J4541 Moderate persistent asthma with (acute) exacerbation: Secondary | ICD-10-CM

## 2017-02-27 DIAGNOSIS — J4531 Mild persistent asthma with (acute) exacerbation: Secondary | ICD-10-CM

## 2017-02-27 LAB — CBC WITH DIFFERENTIAL/PLATELET
BASOS ABS: 0 10*3/uL (ref 0.0–0.1)
Basophils Relative: 0.6 % (ref 0.0–3.0)
Eosinophils Absolute: 0.3 10*3/uL (ref 0.0–0.7)
Eosinophils Relative: 4.2 % (ref 0.0–5.0)
HEMATOCRIT: 48 % (ref 39.0–52.0)
Hemoglobin: 16.3 g/dL (ref 13.0–17.0)
LYMPHS PCT: 19.7 % (ref 12.0–46.0)
Lymphs Abs: 1.6 10*3/uL (ref 0.7–4.0)
MCHC: 34 g/dL (ref 30.0–36.0)
MCV: 88.4 fl (ref 78.0–100.0)
MONOS PCT: 11.5 % (ref 3.0–12.0)
Monocytes Absolute: 0.9 10*3/uL (ref 0.1–1.0)
Neutro Abs: 5.2 10*3/uL (ref 1.4–7.7)
Neutrophils Relative %: 64 % (ref 43.0–77.0)
Platelets: 197 10*3/uL (ref 150.0–400.0)
RBC: 5.43 Mil/uL (ref 4.22–5.81)
RDW: 13.4 % (ref 11.5–15.5)
WBC: 8.1 10*3/uL (ref 4.0–10.5)

## 2017-02-27 MED ORDER — BUDESONIDE-FORMOTEROL FUMARATE 160-4.5 MCG/ACT IN AERO: 2.0000 | INHALATION_SPRAY | Freq: Two times a day (BID) | RESPIRATORY_TRACT | 11 refills | Status: DC

## 2017-02-27 NOTE — Patient Instructions (Addendum)
Work on inhaler technique:  relax and gently blow all the way out then take a nice smooth deep breath back in, triggering the inhaler at same time you start breathing in.  Hold for up to 5 seconds if you can. Blow out thru nose. Rinse and gargle with water when done  Plan A = Automatic = symbicort 160 Take 2 puffs first thing in am and then another 2 puffs about 12 hours later.    Plan B = Backup Only use your albuterol (Proventil/yellow)  as a rescue medication to be used if you can't catch your breath by resting or doing a relaxed purse lip breathing pattern.  - The less you use it, the better it will work when you need it. - Ok to use the inhaler up to 2 puffs  every 4 hours if you must but call for appointment if use goes up over your usual need - Don't leave home without it !!  (think of it like the spare tire for your car)   Plan C = Crisis - only use your albuterol nebulizer if you first try Plan B and it fails to help > ok to use the nebulizer up to every 4 hours but if start needing it regularly call for immediate appointment  Please schedule a follow up office visit in 6 weeks, call sooner if needed with DR Kendrick FriesMcQuaid

## 2017-02-27 NOTE — Assessment & Plan Note (Addendum)
PFT's  08/23/16   FEV1 3.97 (94 % ) ratio 73  p 17 % improvement from saba p nothing prior to study - 02/27/2017  After extensive coaching HFA effectiveness =    90% > change to symb 160 2bid  - Allergy profile 02/27/2017 >  Eos 0. /  IgE  Pending   DDX of  difficult airways management almost all start with A and  include Adherence, Ace Inhibitors, Acid Reflux, Active Sinus Disease, Alpha 1 Antitripsin deficiency, Anxiety masquerading as Airways dz,  ABPA,  Allergy(esp in young), Aspiration (esp in elderly), Adverse effects of meds,  Active smokers, A bunch of PE's (a small clot burden can't cause this syndrome unless there is already severe underlying pulm or vascular dz with poor reserve) plus two Bs  = Bronchiectasis and Beta blocker use..and one C= CHF  Adherence is always the initial "prime suspect" and is a multilayered concern that requires a "trust but verify" approach in every patient - starting with knowing how to use medications, especially inhalers, correctly, keeping up with refills and understanding the fundamental difference between maintenance and prns vs those medications only taken for a very short course and then stopped and not refilled.  - see hfa training > try to consolidate rx with one particular device = hfa  ? Adverse effects of DPI suggested by predmoninant cough features when flares > try off  ? Allergy > check profile, start with symb 160 2bid but hold off further prednisone    I had an extended discussion with the patient reviewing all relevant studies completed to date and  lasting 25 minutes of a 40  minute acute visit with pt not previously known to me re severe/ recurrent  non-specific but potentially very serious refractory respiratory symptoms of unknown etiology.  Each maintenance medication was reviewed in detail including most importantly the difference between maintenance and prns and under what circumstances the prns are to be triggered using an action plan format  that is not reflected in the computer generated alphabetically organized AVS.    Please see AVS for specific instructions unique to this office visit that I personally wrote and verbalized to the the pt in detail and then reviewed with pt  by my nurse highlighting any changes in therapy/plan of care  recommended at today's visit.

## 2017-02-27 NOTE — Addendum Note (Signed)
Addended by: Sandrea HughsWERT, Gabreil [place] B on: 02/27/2017 02:27 PM   Modules accepted: Orders

## 2017-02-27 NOTE — Progress Notes (Signed)
Subjective:     Patient ID: Austin Curry, male   DOB: 05-25-1981,    MRN: [id number]  HPI   36 yobm quit smoking  11/2014 dx as asthma in around age 36 on maint rx since the age of 36 (2017) with flares of cough/wheeze/sob   02/27/2017 1st [facility] office visit/ Austin Curry   Chief Complaint  Patient presents with  . Acute Visit    Pt c/o cough for the past 5 days. Cough is prod with clear to yellow sputum. He states his chest feels tight in the evenings.   maint on BREO  And bid saba hfa baseline   now that sick using saba hs as well   No obvious day to day or daytime variability or assoc mucus plugs or hemoptysis or cp or chest tightness, subjective wheeze or overt sinus or hb symptoms. No unusual exp hx or h/o childhood pna/ asthma or knowledge of premature birth.  Sleeping ok without nocturnal  or early am exacerbation  of respiratory  c/o's or need for noct saba. Also denies any obvious fluctuation of symptoms with weather or environmental changes or other aggravating or alleviating factors except as outlined above   Current Medications, Allergies, Complete Past Medical History, Past Surgical History, Family History, and Social History were reviewed in [facility] electronic medical record.  ROS  The following are not active complaints unless bolded sore throat, dysphagia, dental problems, itching, sneezing,  nasal congestion or excess/ purulent secretions, ear ache,   fever, chills, sweats, unintended wt loss, classically pleuritic or exertional cp,  orthopnea pnd or leg swelling, presyncope, palpitations, abdominal pain, anorexia, nausea, vomiting, diarrhea  or change in bowel or bladder habits, change in stools or urine, dysuria,hematuria,  rash, arthralgias, visual complaints, headache, numbness, weakness or ataxia or problems with walking or coordination,  change in mood/affect or memory.          Review of Systems     Objective:   Physical Exam    amb pleasant bm  nad   Wt Readings from Last 3 Encounters:  02/27/17 176 lb (79.8 kg)  01/16/17 170 lb (77.1 kg)  01/13/17 170 lb 8 oz (77.3 kg)    Vital signs reviewed  - Note on arrival 02 sats  98% on RA   HEENT: nl dentition, turbinates bilaterally, and oropharynx. Nl external ear canals without cough reflex   NECK :  without JVD/Nodes/TM/ nl carotid upstrokes bilaterally   LUNGS: no acc muscle use,  Nl contour chest with late bilateral exp wheeze/ cough   CV:  RRR  no s3 or murmur or increase in P2, and no edema   ABD:  soft and nontender with nl inspiratory excursion in the supine position. No bruits or organomegaly appreciated, bowel sounds nl  MS:  Nl gait/ ext warm without deformities, calf tenderness, cyanosis or clubbing No obvious joint restrictions   SKIN: warm and dry without lesions    NEURO:  alert, approp, nl sensorium with  no motor or cerebellar deficits apparent.      Labs ordered 02/27/2017   Allergy profile    Assessment:

## 2017-02-28 LAB — RESPIRATORY ALLERGY PROFILE REGION II ~~LOC~~
Allergen, A. alternata, m6: <0.1 kU/L
Allergen, C. Herbarum, M2: <0.1 kU/L
Allergen, Cedar tree, t12: 5.54 kU/L — ABNORMAL HIGH
Allergen, Comm Silver Birch, t9: 41 kU/L — ABNORMAL HIGH
Allergen, Cottonwood, t14: 10.4 kU/L — ABNORMAL HIGH
Allergen, D pternoyssinus,d7: 3.25 kU/L — ABNORMAL HIGH
Allergen, Mouse Urine Protein, e78: 0.2 kU/L — ABNORMAL HIGH
Allergen, Mulberry, t76: 1.31 kU/L — ABNORMAL HIGH
Allergen, Oak,t7: 40.3 kU/L — ABNORMAL HIGH
Allergen, P. notatum, m1: <0.1 kU/L
Aspergillus fumigatus, m3: <0.1 kU/L
Bermuda Grass: 14.1 kU/L — ABNORMAL HIGH
Box Elder IgE: 19.7 kU/L — ABNORMAL HIGH
Cat Dander: 1.83 kU/L — ABNORMAL HIGH
Cockroach: 2 kU/L — ABNORMAL HIGH
Common Ragweed: 63.2 kU/L — ABNORMAL HIGH
D. farinae: 4.07 kU/L — ABNORMAL HIGH
Dog Dander: 6.99 kU/L — ABNORMAL HIGH
Elm IgE: 16.5 kU/L — ABNORMAL HIGH
IgE (Immunoglobulin E), Serum: 595 kU/L — ABNORMAL HIGH (ref <115)
Johnson Grass: 5.05 kU/L — ABNORMAL HIGH
Pecan/Hickory Tree IgE: 20.6 kU/L — ABNORMAL HIGH
Rough Pigweed  IgE: 14.4 kU/L — ABNORMAL HIGH
Sheep Sorrel IgE: 16.1 kU/L — ABNORMAL HIGH
Timothy Grass: 7.21 kU/L — ABNORMAL HIGH

## 2017-04-10 ENCOUNTER — Ambulatory Visit: Payer: Self-pay | Admitting: Pulmonary Disease

## 2017-04-14 ENCOUNTER — Encounter: Payer: Self-pay | Admitting: Adult Health

## 2017-04-14 ENCOUNTER — Ambulatory Visit (INDEPENDENT_AMBULATORY_CARE_PROVIDER_SITE_OTHER): Payer: Self-pay | Admitting: Adult Health

## 2017-04-14 DIAGNOSIS — J4541 Moderate persistent asthma with (acute) exacerbation: Secondary | ICD-10-CM

## 2017-04-14 MED ORDER — ALBUTEROL SULFATE HFA 108 (90 BASE) MCG/ACT IN AERS: 1.0000 | INHALATION_SPRAY | Freq: Four times a day (QID) | RESPIRATORY_TRACT | 5 refills | Status: DC | PRN

## 2017-04-14 NOTE — Patient Instructions (Addendum)
Continue on Symbicort 2 puffs Twice daily  - try to miss doses . Rinse after use.  Continue on Zyrtec  At bedtime .  Restart Flonase 1 puff daily .  Use saline nasal spray As needed   Use Albuterol inhaler As needed  For wheezing.  follow up Dr. Kendrick Fries in 3 months and As needed   Please contact office for sooner follow up if symptoms do not improve or worsen or seek emergency care

## 2017-04-14 NOTE — Progress Notes (Signed)
  ID: Austin Curry, male    DOB: [birth year], 36 y.o.   MRN: [id number]  Chief Complaint  Patient presents with  . Follow-up    Asthma    Referring provider: Gordy Savers, MD  HPI: 36 year old male former smoker followed for asthma  TEST  EST  08/26/2016 pulmonary function test ratio 67%, FEV1 3.38 L 80% predicted 17% change to 3.97 L 94% predicted, total lung capacity elevated, DLCO 41.4 109% predicted    04/14/2017 Follow up : Donnald Garre /[place]  Patient returns for a one-month follow-up. Patient was seen last visit with increase asthmatic symptoms. He was changed from [facility] to Symbicort. Restarted on Zyrtec.  He says that he is feeling better. Symptoms have improved with decreased cough and shortness of breath. He still has some nasal drainage intermittently. Lab work last visit showed a normal CBC diff.  Allergy profile was positive for ragweed, dog, cat, grasses and trees. IgE was high at 595.    No Known Allergies  Immunization History  Administered Date(s) Administered  . Influenza,inj,Quad PF,36+ Mos 09/05/2016  . PPD Test 09/11/2013  . Tdap 06/25/2013    Past Medical History:  Diagnosis Date  . Asthma   . Hernia     Tobacco History: History  Smoking Status  . Former Smoker  . Packs/day: 0.25  . Years: 15.00  . Types: Cigarettes  . Quit date: 12/09/2014  Smokeless Tobacco  . Never Used   Counseling given: Not Answered   Outpatient Encounter Prescriptions as of 04/14/2017  Medication Sig  . albuterol (PROVENTIL HFA;VENTOLIN HFA) 108 (90 Base) MCG/ACT inhaler Inhale 1-2 puffs into the lungs every 6 (six) hours as needed for wheezing or shortness of breath.  [name] albuterol (PROVENTIL) (2.5 MG/3ML) 0.083% nebulizer solution Take 3 mLs (2.5 mg total) by nebulization every 4 (four) hours as needed for wheezing or shortness of breath.  . budesonide-formoterol (SYMBICORT) 160-4.5 MCG/ACT inhaler Inhale 2 puffs into the lungs 2 (two) times daily.  .  cetirizine (ZYRTEC) 10 MG tablet Take 10 mg by mouth daily.  [name] ibuprofen (ADVIL,MOTRIN) 200 MG tablet Take 400 mg by mouth every 8 (eight) hours as needed (for pain.).  [name] fluticasone (FLONASE) 50 MCG/ACT nasal spray Place 1 spray into both nostrils daily. (Patient not taking: Reported on 04/14/2017)  . pantoprazole (PROTONIX) 40 MG tablet Take 1 tablet (40 mg total) by mouth daily. (Patient not taking: Reported on 04/14/2017)   No facility-administered encounter medications on file as of 04/14/2017.      Review of Systems  Constitutional:   No  weight loss, night sweats,  Fevers, chills, fatigue, or  lassitude.  HEENT:   No headaches,  Difficulty swallowing,  Tooth/dental problems, or  Sore throat,                No sneezing, itching, ear ache,  +nasal congestion, post nasal drip,   CV:  No chest pain,  Orthopnea, PND, swelling in lower extremities, anasarca, dizziness, palpitations, syncope.   GI  No heartburn, indigestion, abdominal pain, nausea, vomiting, diarrhea, change in bowel habits, loss of appetite, bloody stools.   Resp: No shortness of breath with exertion or at rest.  No excess mucus, no productive cough,  No non-productive cough,  No coughing up of blood.  No change in color of mucus.  No wheezing.  No chest wall deformity  Skin: no rash or lesions.  GU: no dysuria, change in color of urine, no urgency or frequency.  No  flank pain, no hematuria   MS:  No joint pain or swelling.  No decreased range of motion.  No back pain.    Physical Exam  BP 106/66 (BP Location: Left Arm, Cuff Size: Normal)   Pulse 75   Ht  (1.854 m)   Wt 174 lb 9.6 oz (79.2 kg)   SpO2 98%   BMI 23.04 kg/m   GEN: A/Ox3; pleasant , NAD, well nourished    HEENT:  [place]/AT,  EACs-clear, TMs-wnl, NOSE-clear, THROAT-clear, no lesions, no postnasal drip or exudate noted.   NECK:  Supple w/ fair ROM; no JVD; normal carotid impulses w/o bruits; no thyromegaly or nodules palpated; no  lymphadenopathy.    RESP  Clear  P & A; w/o, wheezes/ rales/ or rhonchi. no accessory muscle use, no dullness to percussion  CARD:  RRR, no m/r/g, no peripheral edema, pulses intact, no cyanosis or clubbing.  GI:   Soft & nt; nml bowel sounds; no organomegaly or masses detected.   Musco: Warm bil, no deformities or joint swelling noted.   Neuro: alert, no focal deficits noted.    Skin: Warm, no lesions or rashes Lab Results:  CBC    Component Value Date/Time   WBC 8.1 02/27/2017 1430   RBC 5.43 02/27/2017 1430   HGB 16.3 02/27/2017 1430   HCT 48.0 02/27/2017 1430   PLT 197.0 02/27/2017 1430   MCV 88.4 02/27/2017 1430   MCH 29.3 09/28/2016 0950   MCHC 34.0 02/27/2017 1430   RDW 13.4 02/27/2017 1430   LYMPHSABS 1.6 02/27/2017 1430   MONOABS 0.9 02/27/2017 1430   EOSABS 0.3 02/27/2017 1430   BASOSABS 0.0 02/27/2017 1430    BMET    Component Value Date/Time   NA 138 09/28/2016 0950   K 4.3 09/28/2016 0950   CL 105 09/28/2016 0950   CO2 27 09/28/2016 0950   GLUCOSE 90 09/28/2016 0950   BUN 12 09/28/2016 0950   CREATININE 0.99 09/28/2016 0950   CALCIUM 9.2 09/28/2016 0950   GFRNONAA >60 09/28/2016 0950   GFRAA >60 09/28/2016 0950    BNP No results found for: BNP  ProBNP No results found for: PROBNP  Imaging: No results found.   Assessment & Plan:   No problem-specific Assessment & Plan notes found for this encounter.     Rubye Oaks, NP 04/14/2017

## 2017-04-17 NOTE — Assessment & Plan Note (Signed)
Improved control of asthma. Patient's continue on his current regimen. Allergy profile shows significant allergens and elevated IgE. Patient's symptoms are improved. He will continue his current regimen. Add in Flonase. If symptoms return or frequent exacerbations can consider referring to allergist.  Plan  Patient Instructions  Continue on Symbicort 2 puffs Twice daily  - try to miss doses . Rinse after use.  Continue on Zyrtec  At bedtime .  Restart Flonase 1 puff daily .  Use saline nasal spray As needed   Use Albuterol inhaler As needed  For wheezing.  follow up Dr. Kendrick Fries in 3 months and As needed   Please contact office for sooner follow up if symptoms do not improve or worsen or seek emergency care

## 2017-04-20 ENCOUNTER — Encounter: Payer: Self-pay | Admitting: Adult Health

## 2017-04-20 ENCOUNTER — Ambulatory Visit (INDEPENDENT_AMBULATORY_CARE_PROVIDER_SITE_OTHER): Payer: Self-pay | Admitting: Adult Health

## 2017-04-20 VITALS — BP 120/70 | Temp 98.6°F | Wt 172.6 lb

## 2017-04-20 DIAGNOSIS — J4541 Moderate persistent asthma with (acute) exacerbation: Secondary | ICD-10-CM

## 2017-04-20 MED ORDER — PREDNISONE 20 MG PO TABS: 20.0000 mg | ORAL_TABLET | Freq: Every day | ORAL | 0 refills | Status: DC

## 2017-04-20 NOTE — Patient Instructions (Signed)
WE NOW OFFER   [facility] FAST TRACK!!!  SAME DAY Appointments for ACUTE CARE  Such as: Sprains, Injuries, cuts, abrasions, rashes, muscle pain, joint pain, back pain Colds, flu, sore throats, headache, allergies, cough, fever  Ear pain, sinus and eye infections Abdominal pain, nausea, vomiting, diarrhea, upset stomach Animal/insect bites  3 Easy Ways to Schedule: Walk-In Scheduling Call in scheduling Mychart Sign-up: [email or web address]         

## 2017-04-20 NOTE — Progress Notes (Signed)
Subjective:    Patient ID: Austin Curry, male    DOB: Nov 30, 1981, 36 y.o.   MRN: [id number]  HPI  36 year old male who  has a past medical history of Asthma and Hernia. He presents to the office today for two days of worsening chest tightness, wheezing, and dry cough. He has been using his inhalers and nebulizer as directed. When he woke up this morning he had to use his nebulizer as he felt as though he was having trouble breathing.   He is seen by Pulmonary for his asthma and was recently put on Symbicort instead of Breo.   He denies any fevers or feeling acutely ill.   He works for the [employer],  delivering mail    Review of Systems See HPI   Past Medical History:  Diagnosis Date  . Asthma   . Hernia     Social History   Social History  . Marital status: Single    Spouse name: N/A  . Number of children: N/A  . Years of education: N/A   Occupational History  . Not on file.   Social History Main Topics  . Smoking status: Former Smoker    Packs/day: 0.25    Years: 15.00    Types: Cigarettes    Quit date: 12/09/2014  . Smokeless tobacco: Never Used  . Alcohol use 0.0 oz/week     Comment: 1 drink every other month or so, socially  . Drug use: No     Comment: Stopped Marjuiana 03/24/2014  . Sexual activity: Not on file   Other Topics Concern  . Not on file   Social History Narrative  . No narrative on file    Past Surgical History:  Procedure Laterality Date  . INSERTION OF MESH N/A 04/18/2016   Procedure: INSERTION OF MESH;  Surgeon: Abigail Miyamoto, MD;  Location: [facility];  Service: General;  Laterality: N/A;  . VENTRAL HERNIA REPAIR N/A 04/18/2016   Procedure:  VENTRAL HERNIA REPAIR;  Surgeon: Abigail Miyamoto, MD;  Location: [facility];  Service: General;  Laterality: N/A;    Family History  Problem Relation Age of Onset  . Diabetes Mother   . Hypertension Mother   . Hyperlipidemia Father   . Asthma Brother     No Known Allergies  Current  Outpatient Prescriptions on File Prior to Visit  Medication Sig Dispense Refill  . albuterol (PROVENTIL HFA;VENTOLIN HFA) 108 (90 Base) MCG/ACT inhaler Inhale 1-2 puffs into the lungs every 6 (six) hours as needed for wheezing or shortness of breath. 1 Inhaler 5  . albuterol (PROVENTIL) (2.5 MG/3ML) 0.083% nebulizer solution Take 3 mLs (2.5 mg total) by nebulization every 4 (four) hours as needed for wheezing or shortness of breath. 30 vial 0  . budesonide-formoterol (SYMBICORT) 160-4.5 MCG/ACT inhaler Inhale 2 puffs into the lungs 2 (two) times daily. 1 Inhaler 11  . cetirizine (ZYRTEC) 10 MG tablet Take 10 mg by mouth daily.    . fluticasone (FLONASE) 50 MCG/ACT nasal spray Place 1 spray into both nostrils daily. 16 g 2  . ibuprofen (ADVIL,MOTRIN) 200 MG tablet Take 400 mg by mouth every 8 (eight) hours as needed (for pain.).    [name] pantoprazole (PROTONIX) 40 MG tablet Take 1 tablet (40 mg total) by mouth daily. 30 tablet 0   No current facility-administered medications on file prior to visit.     BP 120/70 (BP Location: Right Arm, Patient Position: Sitting, Cuff Size: Normal)   Temp  98.6 F (37 C) (Oral)   Wt 172 lb 9.6 oz (78.3 kg)   BMI 22.77 kg/m       Objective:   Physical Exam  Constitutional: He is oriented to person, place, and time. He appears well-developed and well-nourished. No distress.  Cardiovascular: Normal rate, regular rhythm, normal heart sounds and intact distal pulses.  Exam reveals no gallop.   No murmur heard. Pulmonary/Chest: Effort normal. No respiratory distress. He has wheezes (wheezing noticed in bilateral upper fields ). He has no rales. He exhibits no tenderness.  Neurological: He is alert and oriented to person, place, and time.  Skin: Skin is warm and dry. No rash noted. He is not diaphoretic. No erythema. No pallor.  Psychiatric: He has a normal mood and affect. His behavior is normal. Judgment and thought content normal.  Nursing note and vitals  reviewed.     Assessment & Plan:  1. Moderate persistent asthma with exacerbation - predniSONE (DELTASONE) 20 MG tablet; Take 1 tablet (20 mg total) by mouth daily with breakfast.  Dispense: 5 tablet; Refill: 0 - Continue with current therapy as directed by Pulmonary  - Follow up as needed  Shirline Frees, NP

## 2017-07-07 ENCOUNTER — Other Ambulatory Visit: Payer: Self-pay

## 2017-07-07 ENCOUNTER — Encounter: Payer: Self-pay | Admitting: Internal Medicine

## 2017-07-14 ENCOUNTER — Ambulatory Visit (INDEPENDENT_AMBULATORY_CARE_PROVIDER_SITE_OTHER): Payer: Self-pay | Admitting: Internal Medicine

## 2017-07-14 ENCOUNTER — Encounter: Payer: Self-pay | Admitting: Internal Medicine

## 2017-07-14 VITALS — BP 164/70 | HR 70 | Temp 97.8°F | Ht 74.5 in | Wt 168.4 lb

## 2017-07-14 DIAGNOSIS — Z0000 Encounter for general adult medical examination without abnormal findings: Secondary | ICD-10-CM

## 2017-07-14 DIAGNOSIS — J4541 Moderate persistent asthma with (acute) exacerbation: Secondary | ICD-10-CM

## 2017-07-14 MED ORDER — PANTOPRAZOLE SODIUM 40 MG PO TBEC: 40.0000 mg | DELAYED_RELEASE_TABLET | Freq: Every day | ORAL | 5 refills | Status: DC

## 2017-07-14 MED ORDER — PREDNISONE 20 MG PO TABS: 20.0000 mg | ORAL_TABLET | Freq: Every day | ORAL | 0 refills | Status: DC

## 2017-07-14 MED ORDER — BUDESONIDE-FORMOTEROL FUMARATE 160-4.5 MCG/ACT IN AERO: 2.0000 | INHALATION_SPRAY | Freq: Two times a day (BID) | RESPIRATORY_TRACT | 11 refills | Status: DC

## 2017-07-14 NOTE — Progress Notes (Signed)
Subjective:    Patient ID: Austin Curry, male    DOB: [birth year], 36 y.o.   MRN: [id number]  HPI  36 year old patient who is seen today for a preventive health examination. He has history of asthma and remains on maintenance bronchodilators.  No recent exacerbations. He was hospitalized in 2017 for an asthma exacerbation and also had ventral hernia repair that year No concerns or complaints  Family history mother age 35 with history of diabetes and hypertension Father age 35 in apparent good health 2 brothers.  One with bipolar disorder and schizophrenia Another brother with type 2 diabetes  Social history.  Employed by [employer]  Past Medical History:  Diagnosis Date  . Asthma   . Hernia      Social History   Social History  . Marital status: Single    Spouse name: N/A  . Number of children: N/A  . Years of education: N/A   Occupational History  . Not on file.   Social History Main Topics  . Smoking status: Former Smoker    Packs/day: 0.25    Years: 15.00    Types: Cigarettes    Quit date: 12/09/2014  . Smokeless tobacco: Never Used  . Alcohol use 0.0 oz/week     Comment: 1 drink every other month or so, socially  . Drug use: No     Comment: Stopped Marjuiana 03/24/2014  . Sexual activity: Not on file   Other Topics Concern  . Not on file   Social History Narrative  . No narrative on file    Past Surgical History:  Procedure Laterality Date  . INSERTION OF MESH N/A 04/18/2016   Procedure: INSERTION OF MESH;  Surgeon: Abigail Miyamotoouglas Blackman, MD;  Location: [facility];  Service: General;  Laterality: N/A;  . VENTRAL HERNIA REPAIR N/A 04/18/2016   Procedure:  VENTRAL HERNIA REPAIR;  Surgeon: Abigail Miyamotoouglas Blackman, MD;  Location: [facility];  Service: General;  Laterality: N/A;    Family History  Problem Relation Age of Onset  . Diabetes Mother   . Hypertension Mother   . Hyperlipidemia Father   . Asthma Brother     No Known Allergies  Current Outpatient  Prescriptions on File Prior to Visit  Medication Sig Dispense Refill  . albuterol (PROVENTIL HFA;VENTOLIN HFA) 108 (90 Base) MCG/ACT inhaler Inhale 1-2 puffs into the lungs every 6 (six) hours as needed for wheezing or shortness of breath. 1 Inhaler 5  . albuterol (PROVENTIL) (2.5 MG/3ML) 0.083% nebulizer solution Take 3 mLs (2.5 mg total) by nebulization every 4 (four) hours as needed for wheezing or shortness of breath. 30 vial 0  . budesonide-formoterol (SYMBICORT) 160-4.5 MCG/ACT inhaler Inhale 2 puffs into the lungs 2 (two) times daily. 1 Inhaler 11  . cetirizine (ZYRTEC) 10 MG tablet Take 10 mg by mouth daily.    . fluticasone (FLONASE) 50 MCG/ACT nasal spray Place 1 spray into both nostrils daily. 16 g 2   No current facility-administered medications on file prior to visit.     BP (!) 164/70 (BP Location: Left Arm, Patient Position: Sitting, Cuff Size: Normal)   Pulse 70   Temp 97.8 F (36.6 C) (Oral)   Ht 6' 2.5" (1.892 m)   Wt 168 lb 6.4 oz (76.4 kg)   SpO2 98%   BMI 21.33 kg/m     Review of Systems  Constitutional: Negative for appetite change, chills, fatigue and fever.  HENT: Negative for congestion, dental problem, ear pain, hearing loss, sore  throat, tinnitus, trouble swallowing and voice change.   Eyes: Negative for pain, discharge and visual disturbance.  Respiratory: Negative for cough, chest tightness, wheezing and stridor.   Cardiovascular: Negative for chest pain, palpitations and leg swelling.  Gastrointestinal: Negative for abdominal distention, abdominal pain, blood in stool, constipation, diarrhea, nausea and vomiting.  Genitourinary: Negative for difficulty urinating, discharge, flank pain, genital sores, hematuria and urgency.  Musculoskeletal: Negative for arthralgias, back pain, gait problem, joint swelling, myalgias and neck stiffness.  Skin: Negative for rash.  Neurological: Negative for dizziness, syncope, speech difficulty, weakness, numbness and  headaches.  Hematological: Negative for adenopathy. Does not bruise/bleed easily.  Psychiatric/Behavioral: Negative for behavioral problems and dysphoric mood. The patient is not nervous/anxious.        Objective:   Physical Exam  Constitutional: He appears well-developed and well-nourished.  HENT:  Head: Normocephalic and atraumatic.  Right Ear: External ear normal.  Left Ear: External ear normal.  Nose: Nose normal.  Mouth/Throat: Oropharynx is clear and moist.  Eyes: Pupils are equal, round, and reactive to light. Conjunctivae and EOM are normal. No scleral icterus.  Neck: Normal range of motion. Neck supple. No JVD present. No thyromegaly present.  Cardiovascular: Regular rhythm, normal heart sounds and intact distal pulses.  Exam reveals no gallop and no friction rub.   No murmur heard. Pulmonary/Chest: Effort normal and breath sounds normal. He exhibits no tenderness.  Abdominal: Soft. Bowel sounds are normal. He exhibits no distension and no mass. There is no tenderness.  Genitourinary: Penis normal.  Musculoskeletal: Normal range of motion. He exhibits no edema or tenderness.  Lymphadenopathy:    He has no cervical adenopathy.  Neurological: He is alert. He has normal reflexes. No cranial nerve deficit. Coordination normal.  Skin: Skin is warm and dry. No rash noted.  Psychiatric: He has a normal mood and affect. His behavior is normal.          Assessment & Plan:   Preventive health examination History is asthma, stable Status post ventral hernia repair  Laboratory studies reviewed Follow-up pulmonary medicine  Return here in one year or as needed  Rogelia Boga

## 2017-07-14 NOTE — Patient Instructions (Signed)
  No change in medical regimen  Avoids foods high in acid such as tomatoes citrus juices, and spicy foods.  Avoid eating within two hours of lying down or before exercising.  Do not overheat.  Try smaller more frequent meals.  If symptoms persist, okay to use Protonix as needed  Return in one year for follow-up

## 2017-07-20 ENCOUNTER — Encounter: Payer: Self-pay | Admitting: Pulmonary Disease

## 2017-07-20 ENCOUNTER — Ambulatory Visit (INDEPENDENT_AMBULATORY_CARE_PROVIDER_SITE_OTHER): Payer: Self-pay | Admitting: Pulmonary Disease

## 2017-07-20 VITALS — BP 108/66 | HR 66 | Ht 74.5 in | Wt 170.0 lb

## 2017-07-20 DIAGNOSIS — J4541 Moderate persistent asthma with (acute) exacerbation: Secondary | ICD-10-CM

## 2017-07-20 DIAGNOSIS — R059 Cough, unspecified: Secondary | ICD-10-CM

## 2017-07-20 DIAGNOSIS — J3089 Other allergic rhinitis: Secondary | ICD-10-CM

## 2017-07-20 DIAGNOSIS — R05 Cough: Secondary | ICD-10-CM

## 2017-07-20 DIAGNOSIS — K219 Gastro-esophageal reflux disease without esophagitis: Secondary | ICD-10-CM

## 2017-07-20 NOTE — Progress Notes (Signed)
Subjective:    Patient ID: Austin Curry, male    DOB: 05/25/1981, 36 y.o.   MRN: [id number]  Synopsis: Referred in 2017 for asthma August 2017 lung function testing showed ratio 67%, FEV1 increased from 3.38 L to 3.97 L after bronchodilator which is a 17% change to 94% predicted, total lung capacity was incorrect, DLCO 41.4-109% predicted  HPI Chief Complaint  Patient presents with  . Follow-up    pt c/o some chest congestion, prod cough with clear mucus, sob and wheezing with exertion.     Austin PiccoloDerek says that on the hot weather days he has more chest congesiton and dyspnea and wheezing.  He says that his symptoms are worse on those days.    Apart from the hot days he has done OK.  No ER visits.  He had a little wheezing yesterday with some clear mucus yesterday.  He uses albuterol maybe 2 times per week.  He will have to use it in the middle of the night, 5 times this month, but that is unusual for him, typically its less than that.     Past Medical History:  Diagnosis Date  . Asthma   . Hernia       Review of Systems  Constitutional: Negative for chills, fatigue and fever.  HENT: Positive for postnasal drip and rhinorrhea. Negative for sinus pain.   Respiratory: Positive for cough, shortness of breath and wheezing.   Cardiovascular: Negative for chest pain, palpitations and leg swelling.       Objective:   Physical Exam Vitals:   07/20/17 1539  BP: 108/66  Pulse: 66  SpO2: 98%  Weight: 170 lb (77.1 kg)  Height: 6' 2.5" (1.892 m)  RA  Gen: well appearing HENT: OP clear, TM's clear, neck supple PULM: CTA B, normal percussion CV: RRR, no mgr, trace edema GI: BS+, soft, nontender Derm: no cyanosis or rash Psyche: normal mood and affect  CBC    Component Value Date/Time   WBC 8.1 02/27/2017 1430   RBC 5.43 02/27/2017 1430   HGB 16.3 02/27/2017 1430   HCT 48.0 02/27/2017 1430   PLT 197.0 02/27/2017 1430   MCV 88.4 02/27/2017 1430   MCH 29.3 09/28/2016 0950     MCHC 34.0 02/27/2017 1430   RDW 13.4 02/27/2017 1430   LYMPHSABS 1.6 02/27/2017 1430   MONOABS 0.9 02/27/2017 1430   EOSABS 0.3 02/27/2017 1430   BASOSABS 0.0 02/27/2017 1430     Records reviewed from my partners where she was cared for for asthma exacerbations [facility]     Assessment & Plan:  Moderate persistent asthma with exacerbation  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 One seems to be related to laryngeal irritation from upper airway cough syndrome secondary to acid reflux, postnasal drip and likely some irritation from his dry powder inhaler. This seems to have improved with using Symbicort. I like to get him using a spacer for this.   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  Plan: For your asthma: Keep taking Symbicort 2 puffs twice a You Symbicort to the spacer we gave the Keep using albuterol on an as-needed basis  For postnasal drip: Continue using Flonase twice a day  For  acid reflux: Keep taking protonic's daily next line  You need to work inside on code red days because of your moderate persistent asthma, letter provided today  We'll see back in 4-6 months or sooner if needed  > 50% of this 27 minute visit spent face to face  Current Outpatient Prescriptions:  .  albuterol (PROVENTIL HFA;VENTOLIN HFA) 108 (90 Base) MCG/ACT inhaler, Inhale 1-2 puffs into the lungs every 6 (six) hours as needed for wheezing or shortness of breath., Disp: 1 Inhaler, Rfl: 5 .  albuterol (PROVENTIL) (2.5 MG/3ML) 0.083% nebulizer solution, Take 3 mLs (2.5 mg total) by nebulization every 4 (four) hours as needed for wheezing or shortness of breath., Disp: 30 vial, Rfl: 0 .  budesonide-formoterol  (SYMBICORT) 160-4.5 MCG/ACT inhaler, Inhale 2 puffs into the lungs 2 (two) times daily., Disp: 1 Inhaler, Rfl: 11 .  cetirizine (ZYRTEC) 10 MG tablet, Take 10 mg by mouth daily., Disp: , Rfl:  .  fluticasone (FLONASE) 50 MCG/ACT nasal spray, Place 1 spray into both nostrils daily., Disp: 16 g, Rfl: 2 .  pantoprazole (PROTONIX) 40 MG tablet, Take 1 tablet (40 mg total) by mouth daily., Disp: 30 tablet, Rfl: 5

## 2017-07-20 NOTE — Patient Instructions (Signed)
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

## 2017-07-21 ENCOUNTER — Ambulatory Visit: Payer: Self-pay | Admitting: Pulmonary Disease

## 2017-09-13 IMAGING — DX DG CHEST 2V
2 series · 2 of 2 positions shown · non-contrast
Comparison: PA and lateral chest x-ray April 07, 2016

CLINICAL DATA: Follow-up of pneumonia, symptoms have improved ;
history of asthma.

EXAM:
CHEST  2 VIEW

[chest pa]
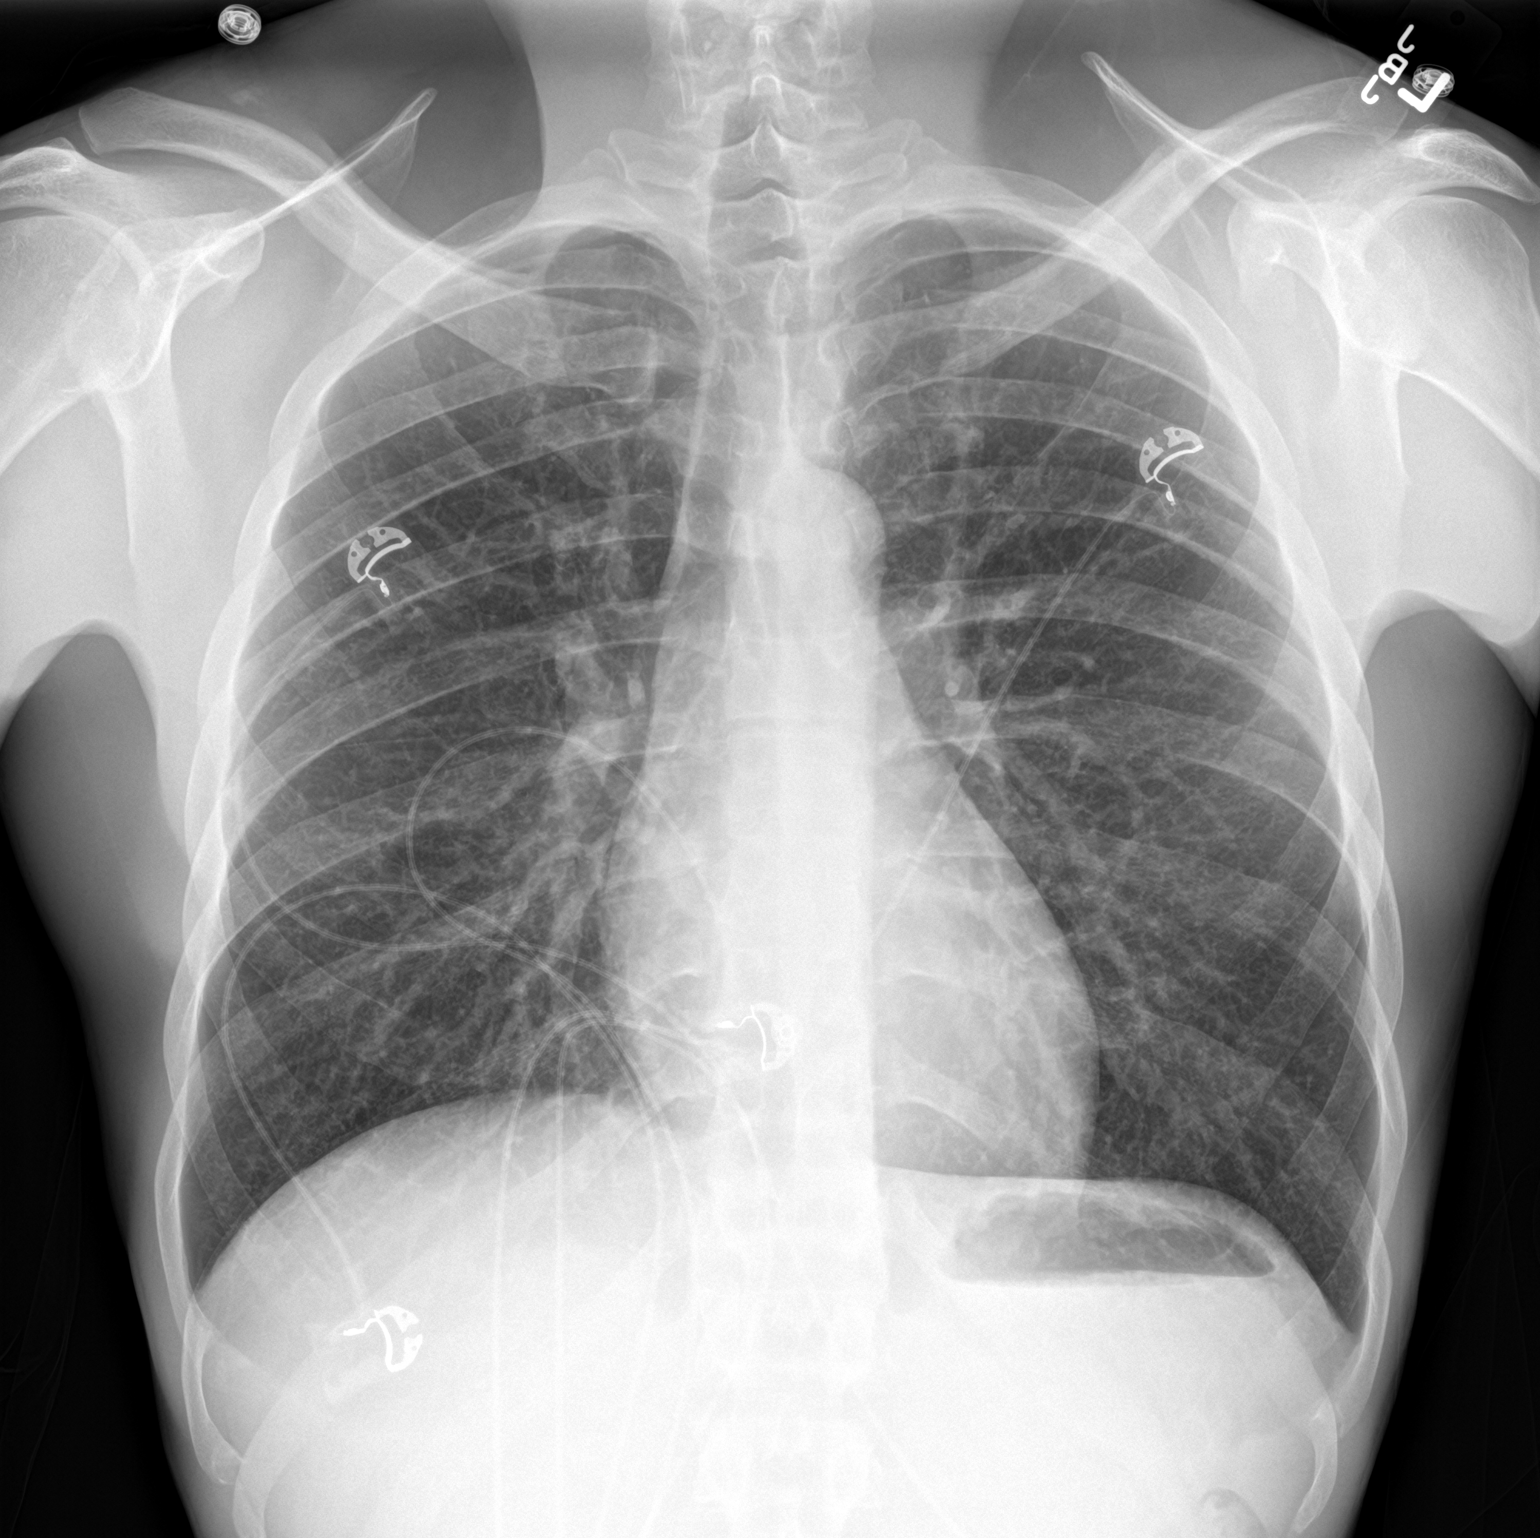

[chest lat]
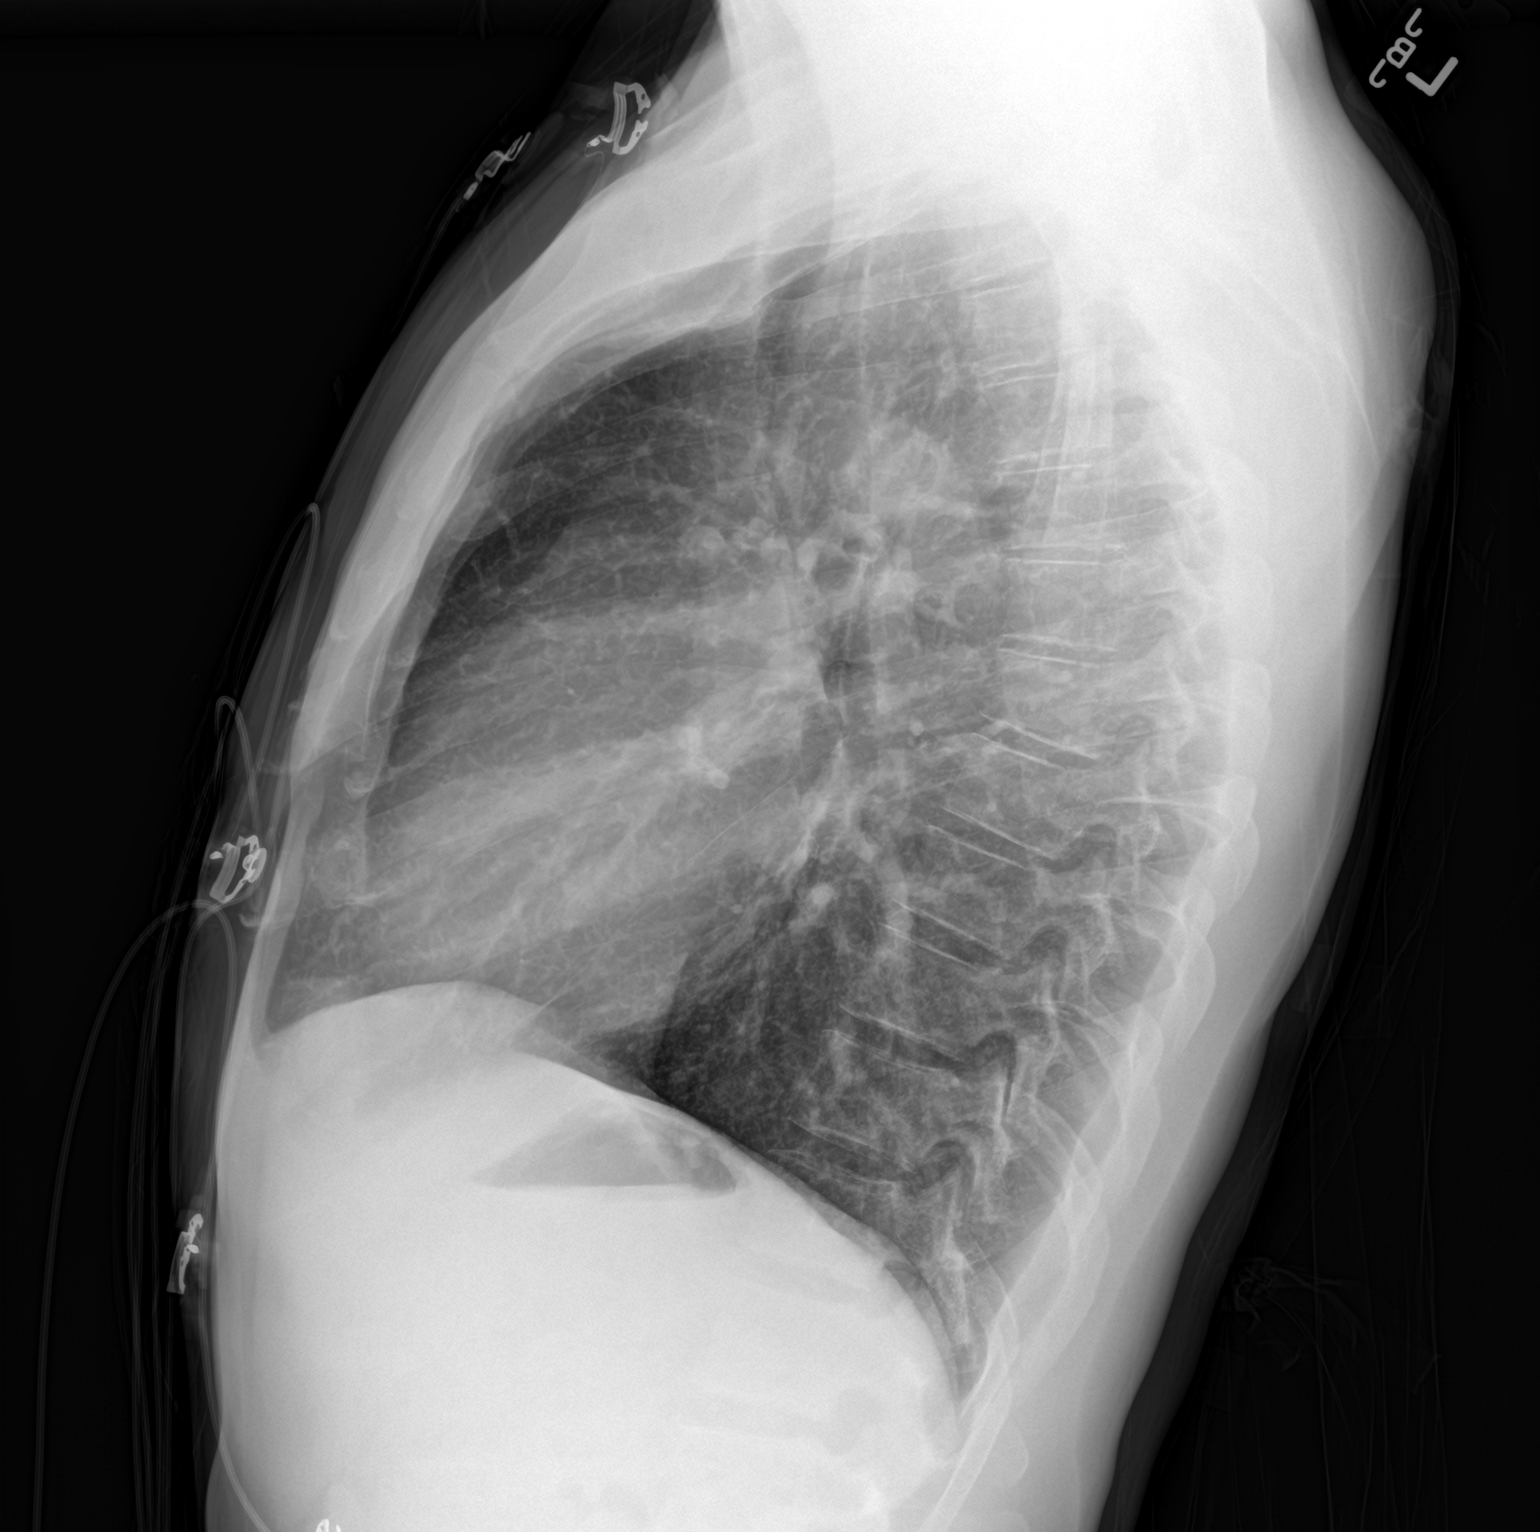

[2 of 2 positions shown; findings below may reference images not displayed]

FINDINGS: The lungs remain mildly hyperinflated. There is no focal infiltrate.
The interstitial markings are coarse bilaterally but not greatly
changed. The heart and pulmonary vascularity are normal. The
mediastinum is normal in width. The trachea is midline. The bony
thorax exhibits no acute abnormality.
IMPRESSION: There is no pneumonia nor CHF. Mild hyperinflation is consistent
with known reactive airway disease.

## 2017-09-14 ENCOUNTER — Encounter: Payer: Self-pay | Admitting: Internal Medicine

## 2017-11-17 ENCOUNTER — Emergency Department (HOSPITAL_BASED_OUTPATIENT_CLINIC_OR_DEPARTMENT_OTHER)
Admission: EM | Admit: 2017-11-17 | Discharge: 2017-11-17 | Disposition: A | Discharge status: Home / Self Care | Payer: Self-pay | Attending: Physician Assistant | Admitting: Physician Assistant

## 2017-11-17 ENCOUNTER — Other Ambulatory Visit: Payer: Self-pay

## 2017-11-17 ENCOUNTER — Encounter (HOSPITAL_BASED_OUTPATIENT_CLINIC_OR_DEPARTMENT_OTHER): Payer: Self-pay

## 2017-11-17 DIAGNOSIS — Z79899 Other long term (current) drug therapy: Secondary | ICD-10-CM | POA: Insufficient documentation

## 2017-11-17 DIAGNOSIS — M25511 Pain in right shoulder: Secondary | ICD-10-CM | POA: Insufficient documentation

## 2017-11-17 DIAGNOSIS — Z87891 Personal history of nicotine dependence: Secondary | ICD-10-CM | POA: Insufficient documentation

## 2017-11-17 DIAGNOSIS — J45909 Unspecified asthma, uncomplicated: Secondary | ICD-10-CM | POA: Insufficient documentation

## 2017-11-17 MED ORDER — LIDOCAINE 5 % EX PTCH: 1.0000 | MEDICATED_PATCH | CUTANEOUS | 0 refills | Status: DC

## 2017-11-17 MED ORDER — NAPROXEN 375 MG PO TABS: 375.0000 mg | ORAL_TABLET | Freq: Two times a day (BID) | ORAL | 0 refills | Status: DC

## 2017-11-17 NOTE — Discharge Instructions (Signed)
This is likely a biceps tendinitis.  Perform the exercises as discussed.  Use lidocaine patches. Please take the Naproxen as prescribed for pain. Do not take any additional NSAIDs including Motrin, Aleve, Ibuprofen, Advil.  He may also use Tylenol.  Warm soaks in Epsom salt.  Alternate with ice.  Follow with orthopedics.

## 2017-11-17 NOTE — ED Triage Notes (Signed)
C/o right shoulder pain x approx 4 months-denies injury-NAD-steady gait

## 2017-11-17 NOTE — ED Provider Notes (Signed)
[facility] EMERGENCY DEPARTMENT Provider Note   CSN: [id number] Arrival date & time: 11/17/17  1944     History   Chief Complaint Chief Complaint  Patient presents with  . Shoulder Pain    HPI Austin Curry is a 36 y.o. male.  HPI 60 European American male with no significant past medical history presents to the ED with complaints of right shoulder pain.  States this is been ongoing for the past 4 months.  States the pain is intermittent.  Patient does a lot of heavy lifting and repetitive motions at work.  Patient has no specific injury.  States the pain is intermittent.  Nothing makes better.  He has been trying Aleve and Motrin with only little relief.  States the pain is worse with movement and palpation.  Patient has a hard time lifting his arm over his head.  Pain is worse at night and is a throbbing pain.  Denies any associated paresthesias, weakness. Past Medical History:  Diagnosis Date  . Asthma   . Hernia     Patient Active Problem List   Diagnosis Date Noted  . Status asthmaticus 04/08/2016  . Asthma exacerbation 04/08/2016  . Ventral hernia 01/12/2016  . Asthma, chronic 06/25/2013    Past Surgical History:  Procedure Laterality Date  . INSERTION OF MESH N/A 04/18/2016   Procedure: INSERTION OF MESH;  Surgeon: Abigail Miyamoto, MD;  Location: [facility];  Service: General;  Laterality: N/A;  . VENTRAL HERNIA REPAIR N/A 04/18/2016   Procedure:  VENTRAL HERNIA REPAIR;  Surgeon: Abigail Miyamoto, MD;  Location: [facility];  Service: General;  Laterality: N/A;       Home Medications    Prior to Admission medications   Medication Sig Start Date End Date Taking? Authorizing Provider  albuterol (PROVENTIL HFA;VENTOLIN HFA) 108 (90 Base) MCG/ACT inhaler Inhale 1-2 puffs into the lungs every 6 (six) hours as needed for wheezing or shortness of breath. 04/14/17   Parrett, Virgel Bouquet, NP  albuterol (PROVENTIL) (2.5 MG/3ML) 0.083% nebulizer solution Take 3 mLs (2.5  mg total) by nebulization every 4 (four) hours as needed for wheezing or shortness of breath. 09/26/16   Bevelyn Ngo, NP  budesonide-formoterol (SYMBICORT) 160-4.5 MCG/ACT inhaler Inhale 2 puffs into the lungs 2 (two) times daily. 07/14/17   Gordy Savers, MD  cetirizine (ZYRTEC) 10 MG tablet Take 10 mg by mouth daily.    [provider]  fluticasone (FLONASE) 50 MCG/ACT nasal spray Place 1 spray into both nostrils daily. 04/08/16   Jeralyn Bennett, MD  lidocaine (LIDODERM) 5 % Place 1 patch onto the skin daily. Remove & Discard patch within 12 hours or as directed by MD 11/17/17   Rise Mu, PA-C  naproxen (NAPROSYN) 375 MG tablet Take 1 tablet (375 mg total) by mouth 2 (two) times daily. 11/17/17   Rise Mu, PA-C  pantoprazole (PROTONIX) 40 MG tablet Take 1 tablet (40 mg total) by mouth daily. 07/14/17   Gordy Savers, MD    Family History Family History  Problem Relation Age of Onset  . Diabetes Mother   . Hypertension Mother   . Hyperlipidemia Father   . Asthma Brother     Social History Social History   Tobacco Use  . Smoking status: Former Smoker    Packs/day: 0.25    Years: 15.00    Pack years: 3.75    Types: Cigarettes    Last attempt to quit: 12/09/2014    Years  since quitting: 2.9  . Smokeless tobacco: Never Used  Substance Use Topics  . Alcohol use: Yes    Alcohol/week: 0.0 oz    Comment: occ  . Drug use: No     Allergies   Patient has no known allergies.   Review of Systems Review of Systems  Constitutional: Negative for chills and fever.  Musculoskeletal: Positive for arthralgias and myalgias. Negative for joint swelling.  Skin: Negative for color change.  Neurological: Negative for weakness and numbness.     Physical Exam Updated Vital Signs BP 114/67 (BP Location: Left Arm)   Pulse (!) 55   Temp 98.4 F (36.9 C) (Oral)   Resp 18   Ht 6\' 1"  (1.854 m)   Wt 73.9 kg (163 lb)   SpO2 100%   BMI 21.51  kg/m   Physical Exam  Constitutional: He appears well-developed and well-nourished. No distress.  HENT:  Head: Normocephalic and atraumatic.  Eyes: Right eye exhibits no discharge. Left eye exhibits no discharge. No scleral icterus.  Neck: Normal range of motion.  Cardiovascular: Intact distal pulses.  Pulmonary/Chest: No respiratory distress.  Musculoskeletal:       Right shoulder: He exhibits decreased range of motion, tenderness, bony tenderness and pain. He exhibits no swelling, no effusion, no crepitus, no deformity, no spasm, normal pulse and normal strength.  Positive Hawkins and Neer sign.  Patient with pain with palpation over the bicep tendon insertion.  Limited range of motion due to pain.  Radial pulses 2+ bilaterally.  Brisk cap refill.  Sensation intact.  Neurological: He is alert.  Normal grip strength.  Skin: Skin is warm and dry. Capillary refill takes less than 2 seconds. No pallor.  Psychiatric: His behavior is normal. Judgment and thought content normal.  Nursing note and vitals reviewed.    ED Treatments / Results  Labs (all labs ordered are listed, but only abnormal results are displayed) Labs Reviewed - No data to display  EKG  EKG Interpretation None       Radiology No results found.  Procedures Procedures (including critical care time)  Medications Ordered in ED Medications - No data to display   Initial Impression / Assessment and Plan / ED Course  I have reviewed the triage vital signs and the nursing notes.  Pertinent labs & imaging results that were available during my care of the patient were reviewed by me and considered in my medical decision making (see chart for details).     Patient presents with pain to his right shoulder for the past 4 months that is intermittent.  Appears to be related to bicep tendinitis versus rotator cuff pathology.  Do not feel imaging is indicated at this time.  No warmth or erythema of the joint that would  be concerning for septic arthritis.  Pain managed in ED. Pt advised to follow up with orthopedics if symptoms persist for possibility of missed fracture diagnosis. Patient given brace while in ED, conservative therapy recommended and discussed. Patient will be dc home & is agreeable with above plan.   Final Clinical Impressions(s) / ED Diagnoses   Final diagnoses:  Acute pain of right shoulder    ED Discharge Orders        Ordered    lidocaine (LIDODERM) 5 %  Every 24 hours     11/17/17 2116    naproxen (NAPROSYN) 375 MG tablet  2 times daily     11/17/17 2116       Rise MuLeaphart, Kenneth T,  PA-C 11/17/17 2200    Abelino DerrickMackuen, Courteney Lyn, MD 11/17/17 2253

## 2017-11-22 ENCOUNTER — Encounter: Payer: Self-pay | Admitting: Pulmonary Disease

## 2017-11-22 ENCOUNTER — Ambulatory Visit (INDEPENDENT_AMBULATORY_CARE_PROVIDER_SITE_OTHER): Payer: Self-pay | Admitting: Pulmonary Disease

## 2017-11-22 ENCOUNTER — Ambulatory Visit (INDEPENDENT_AMBULATORY_CARE_PROVIDER_SITE_OTHER): Payer: Self-pay | Admitting: Family Medicine

## 2017-11-22 ENCOUNTER — Encounter: Payer: Self-pay | Admitting: Family Medicine

## 2017-11-22 VITALS — BP 118/66 | HR 80 | Ht 74.5 in | Wt 168.0 lb

## 2017-11-22 DIAGNOSIS — J4540 Moderate persistent asthma, uncomplicated: Secondary | ICD-10-CM

## 2017-11-22 DIAGNOSIS — M25511 Pain in right shoulder: Secondary | ICD-10-CM

## 2017-11-22 DIAGNOSIS — Z23 Encounter for immunization: Secondary | ICD-10-CM

## 2017-11-22 MED ORDER — ALBUTEROL SULFATE HFA 108 (90 BASE) MCG/ACT IN AERS: 1.0000 | INHALATION_SPRAY | Freq: Four times a day (QID) | RESPIRATORY_TRACT | 5 refills | Status: DC | PRN

## 2017-11-22 MED ORDER — ALBUTEROL SULFATE (2.5 MG/3ML) 0.083% IN NEBU: 2.5000 mg | INHALATION_SOLUTION | RESPIRATORY_TRACT | 5 refills | Status: DC | PRN

## 2017-11-22 MED ORDER — DICLOFENAC SODIUM 75 MG PO TBEC: 75.0000 mg | DELAYED_RELEASE_TABLET | Freq: Two times a day (BID) | ORAL | 3 refills | Status: DC

## 2017-11-22 NOTE — Progress Notes (Signed)
Subjective:    Patient ID: Austin Curry, male    DOB: 22-Sep-1981, 36 y.o.   MRN: [id number]  Synopsis: Referred in 2017 for asthma August 2017 lung function testing showed ratio 67%, FEV1 increased from 3.38 L to 3.97 L after bronchodilator which is a 17% change to 94% predicted, total lung capacity was incorrect, DLCO 41.4-109% predicted  HPI Chief Complaint  Patient presents with  . Follow-up    pt states he is doing well, does notice some difficulty with taking in a deep breath with cold temps   Austin Curry has been doing better lately.  He thinks the cold weather is better than the warmer weather months.  He feels like he has been staying healthy.  He uses albuterol from time to time, typically if the weather is quite cold and he is outside, but this is rare. He has used it once in 2 weeks.  Recent cough, no need for prednisone or antibiotics lately.    Past Medical History:  Diagnosis Date  . Asthma   . Hernia       Review of Systems  Constitutional: Negative for chills, fatigue and fever.  HENT: Positive for postnasal drip and rhinorrhea. Negative for sinus pain.   Respiratory: Positive for cough, shortness of breath and wheezing.   Cardiovascular: Negative for chest pain, palpitations and leg swelling.       Objective:   Physical Exam Vitals:   11/22/17 0900  BP: 118/66  Pulse: 80  SpO2: 99%  Weight: 168 lb (76.2 kg)  Height: 6' 2.5" (1.892 m)  RA  Gen: well appearing HENT: OP clear, TM's clear, neck supple PULM: CTA B, normal percussion CV: RRR, no mgr, trace edema GI: BS+, soft, nontender Derm: no cyanosis or rash Psyche: normal mood and affect   CBC    Component Value Date/Time   WBC 8.1 02/27/2017 1430   RBC 5.43 02/27/2017 1430   HGB 16.3 02/27/2017 1430   HCT 48.0 02/27/2017 1430   PLT 197.0 02/27/2017 1430   MCV 88.4 02/27/2017 1430   MCH 29.3 09/28/2016 0950   MCHC 34.0 02/27/2017 1430   RDW 13.4 02/27/2017 1430   LYMPHSABS 1.6 02/27/2017  1430   MONOABS 0.9 02/27/2017 1430   EOSABS 0.3 02/27/2017 1430   BASOSABS 0.0 02/27/2017 1430        Assessment & Plan:  Moderate persistent chronic asthma without complication   Discussion: This has been a stable interval for Austin Curry.  He has not had an exacerbation since the last visit.  As long as he is taking Symbicort he does well.  Plan: Asthma: Keep taking Symbicort 2 puffs twice a day no matter how you feel that Flu shot today We will refill albuterol, use this every 4-6 hours as needed for chest tightness wheezing or shortness of breath  Follow-up in 6 months or sooner if needed    Current Outpatient Medications:  .  albuterol (PROVENTIL HFA;VENTOLIN HFA) 108 (90 Base) MCG/ACT inhaler, Inhale 1-2 puffs into the lungs every 6 (six) hours as needed for wheezing or shortness of breath., Disp: 1 Inhaler, Rfl: 5 .  albuterol (PROVENTIL) (2.5 MG/3ML) 0.083% nebulizer solution, Take 3 mLs (2.5 mg total) by nebulization every 4 (four) hours as needed for wheezing or shortness of breath., Disp: 30 vial, Rfl: 0 .  budesonide-formoterol (SYMBICORT) 160-4.5 MCG/ACT inhaler, Inhale 2 puffs into the lungs 2 (two) times daily., Disp: 1 Inhaler, Rfl: 11 .  cetirizine (ZYRTEC) 10 MG tablet,  Take 10 mg by mouth daily., Disp: , Rfl:  .  fluticasone (FLONASE) 50 MCG/ACT nasal spray, Place 1 spray into both nostrils daily., Disp: 16 g, Rfl: 2 .  lidocaine (LIDODERM) 5 %, Place 1 patch onto the skin daily. Remove & Discard patch within 12 hours or as directed by MD, Disp: 10 patch, Rfl: 0 .  naproxen (NAPROSYN) 375 MG tablet, Take 1 tablet (375 mg total) by mouth 2 (two) times daily., Disp: 20 tablet, Rfl: 0 .  pantoprazole (PROTONIX) 40 MG tablet, Take 1 tablet (40 mg total) by mouth daily., Disp: 30 tablet, Rfl: 5

## 2017-11-22 NOTE — Patient Instructions (Addendum)
Asthma: Keep taking Symbicort 2 puffs twice a day no matter how you feel that Flu shot today We will refill albuterol, use this every 4-6 hours as needed for chest tightness wheezing or shortness of breath  Follow-up in 6 months or sooner if needed

## 2017-11-22 NOTE — Patient Instructions (Signed)
You have rotator cuff impingement Try to avoid painful activities (overhead activities, lifting with extended arm) as much as possible. Diclofenac 75mg twice a day with food for pain and inflammation. Can take tylenol in addition to this. Subacromial injection may be beneficial to help with pain and to decrease inflammation. Consider physical therapy with transition to home exercise program. Do home exercise program with theraband and scapular stabilization exercises daily 3 sets of 10 once a day. If not improving at follow-up we will consider imaging, injection, physical therapy, and/or nitro patches. Follow up with me in 6 weeks.  

## 2017-11-23 ENCOUNTER — Encounter: Payer: Self-pay | Admitting: Family Medicine

## 2017-11-23 DIAGNOSIS — M25511 Pain in right shoulder: Secondary | ICD-10-CM | POA: Insufficient documentation

## 2017-11-23 HISTORY — DX: Pain in right shoulder: M25.511

## 2017-11-23 NOTE — Progress Notes (Signed)
PCP: Gordy SaversKwiatkowski, Peter F, MD  Subjective:   HPI: Patient is a 36 y.o. male here for right shoulder pain.  Patient reports he's had about 2 months of lateral right shoulder pain. No acute injury or trauma. Pain level up to 7/10 and sharp, stabbing. + night pain. 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 He woke up on 11/26 with swelling in this arm. Since Friday he noted tingling in 1st-3rd fingertips then moved into all fingers. Tried aleve with some benefit. No skin changes, numbness.  Past Medical History:  Diagnosis Date  . Asthma   . Hernia     Current Outpatient Medications on File Prior to Visit  Medication Sig Dispense Refill  . albuterol (PROVENTIL HFA;VENTOLIN HFA) 108 (90 Base) MCG/ACT inhaler Inhale 1-2 puffs into the lungs every 6 (six) hours as needed for wheezing or shortness of breath. 1 Inhaler 5  . albuterol (PROVENTIL) (2.5 MG/3ML) 0.083% nebulizer solution Take 3 mLs (2.5 mg total) by nebulization every 4 (four) hours as needed for wheezing or shortness of breath. 30 vial 5  . budesonide-formoterol (SYMBICORT) 160-4.5 MCG/ACT inhaler Inhale 2 puffs into the lungs 2 (two) times daily. 1 Inhaler 11  . cetirizine (ZYRTEC) 10 MG tablet Take 10 mg by mouth daily.    . fluticasone (FLONASE) 50 MCG/ACT nasal spray Place 1 spray into both nostrils daily. 16 g 2  . lidocaine (LIDODERM) 5 % Place 1 patch onto the skin daily. Remove & Discard patch within 12 hours or as directed by MD 10 patch 0  . naproxen (NAPROSYN) 375 MG tablet Take 1 tablet (375 mg total) by mouth 2 (two) times daily. 20 tablet 0  . pantoprazole (PROTONIX) 40 MG tablet Take 1 tablet (40 mg total) by mouth daily. 30 tablet 5   No current facility-administered medications on file prior to visit.     Past Surgical History:  Procedure Laterality Date  . INSERTION OF MESH N/A 04/18/2016   Procedure: INSERTION OF MESH;  Surgeon: Abigail Miyamotoouglas Blackman, MD;  Location: [facility];  Service: General;   Laterality: N/A;  . VENTRAL HERNIA REPAIR N/A 04/18/2016   Procedure:  VENTRAL HERNIA REPAIR;  Surgeon: Abigail Miyamotoouglas Blackman, MD;  Location: [facility];  Service: General;  Laterality: N/A;    No Known Allergies  Social History   Socioeconomic History  . Marital status: Single    Spouse name: Not on file  . Number of children: Not on file  . Years of education: Not on file  . Highest education level: Not on file  Social Needs  . Financial resource strain: Not on file  . Food insecurity - worry: Not on file  . Food insecurity - inability: Not on file  . Transportation needs - medical: Not on file  . Transportation needs - non-medical: Not on file  Occupational History  . Not on file  Tobacco Use  . Smoking status: Former Smoker    Packs/day: 0.25    Years: 15.00    Pack years: 3.75    Types: Cigarettes    Last attempt to quit: 12/09/2014    Years since quitting: 2.9  . Smokeless tobacco: Never Used  Substance and Sexual Activity  . Alcohol use: Yes    Alcohol/week: 0.0 oz    Comment: occ  . Drug use: No  . Sexual activity: Not on file  Other Topics Concern  . Not on file  Social History Narrative  . Not on file    Family History  Problem Relation Age of Onset  . Diabetes Mother   . Hypertension Mother   . Hyperlipidemia Father   . Asthma Brother     BP 115/74   Pulse 69   Ht 6\' 3"  (1.905 m)   Wt 168 lb (76.2 kg)   BMI 21.00 kg/m   Review of Systems: See HPI above.     Objective:  Physical Exam:  Gen: NAD, comfortable in exam room  Right shoulder: No swelling, ecchymoses.  No gross deformity. No TTP. FROM with painful arc. Positive Hawkins, Neers. Negative Yergasons. Strength 5/5 with empty can and resisted internal/external rotation.  Pain with empty can. Negative apprehension. NV intact distally.  Left shoulder: No swelling, ecchymoses.  No gross deformity. No TTP. FROM. Strength 5/5 with empty can and resisted internal/external rotation. NV  intact distally.   Assessment & Plan:  1. Right shoulder pain - 2/2 rotator cuff impingement.  Diclofenac twice a day with food.  Shown home exercises to do daily.  Consider imaging, injection, PT, and/or nitro patches if not improving.  F/u in 6 weeks.

## 2017-11-23 NOTE — Assessment & Plan Note (Signed)
2/2 rotator cuff impingement.  Diclofenac twice a day with food.  Shown home exercises to do daily.  Consider imaging, injection, PT, and/or nitro patches if not improving.  F/u in 6 weeks.

## 2017-11-24 ENCOUNTER — Ambulatory Visit: Payer: Self-pay | Admitting: Pulmonary Disease

## 2018-01-05 ENCOUNTER — Ambulatory Visit (INDEPENDENT_AMBULATORY_CARE_PROVIDER_SITE_OTHER): Payer: 59 | Admitting: Family Medicine

## 2018-01-05 ENCOUNTER — Encounter: Payer: Self-pay | Admitting: Family Medicine

## 2018-01-05 DIAGNOSIS — M25511 Pain in right shoulder: Secondary | ICD-10-CM | POA: Diagnosis not present

## 2018-01-05 NOTE — Patient Instructions (Signed)
You have rotator cuff impingement Try to avoid painful activities (overhead activities, lifting with extended arm) as much as possible. Tylenol, aleve only if needed at this point. I would consider physical therapy and/or nitro patches as the next steps - let me know if you don't continue to improve and want to do either of these. Do home exercise program with theraband and scapular stabilization exercises daily 3 sets of 10 once a day. Consider cortisone injection if not improving as well. Follow up with me in 6 weeks or as needed if you're doing well.

## 2018-01-09 ENCOUNTER — Encounter: Payer: Self-pay | Admitting: Family Medicine

## 2018-01-09 NOTE — Progress Notes (Signed)
PCP: Gordy SaversKwiatkowski, Peter F, MD  Subjective:   HPI: Patient is a 37 y.o. male here for right shoulder pain.  11/22/17: Patient reports he's had about 2 months of lateral right shoulder pain. No acute injury or trauma. Pain level up to 7/10 and sharp, stabbing. + night pain. 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 He woke up on 11/26 with swelling in this arm. Since Friday he noted tingling in 1st-3rd fingertips then moved into all fingers. Tried aleve with some benefit. No skin changes, numbness.  01/05/18: Patient reports he feels a little better compared to last visit. Doing home exercises and took diclofenac, now out of this. Doesn't lie on right side due to pain. Still hurts with reaching but not as bad as before. Pain level 0/10 currently. No skin changes, numbness.  Past Medical History:  Diagnosis Date  . Asthma   . Hernia     Current Outpatient Medications on File Prior to Visit  Medication Sig Dispense Refill  . albuterol (PROVENTIL HFA;VENTOLIN HFA) 108 (90 Base) MCG/ACT inhaler Inhale 1-2 puffs into the lungs every 6 (six) hours as needed for wheezing or shortness of breath. 1 Inhaler 5  . albuterol (PROVENTIL) (2.5 MG/3ML) 0.083% nebulizer solution Take 3 mLs (2.5 mg total) by nebulization every 4 (four) hours as needed for wheezing or shortness of breath. 30 vial 5  . budesonide-formoterol (SYMBICORT) 160-4.5 MCG/ACT inhaler Inhale 2 puffs into the lungs 2 (two) times daily. 1 Inhaler 11  . cetirizine (ZYRTEC) 10 MG tablet Take 10 mg by mouth daily.    . diclofenac (VOLTAREN) 75 MG EC tablet Take 1 tablet (75 mg total) by mouth 2 (two) times daily. 60 tablet 3  . fluticasone (FLONASE) 50 MCG/ACT nasal spray Place 1 spray into both nostrils daily. 16 g 2  . lidocaine (LIDODERM) 5 % Place 1 patch onto the skin daily. Remove & Discard patch within 12 hours or as directed by MD 10 patch 0  . naproxen (NAPROSYN) 375 MG tablet Take 1 tablet (375 mg total) by  mouth 2 (two) times daily. 20 tablet 0  . pantoprazole (PROTONIX) 40 MG tablet Take 1 tablet (40 mg total) by mouth daily. 30 tablet 5   No current facility-administered medications on file prior to visit.     Past Surgical History:  Procedure Laterality Date  . INSERTION OF MESH N/A 04/18/2016   Procedure: INSERTION OF MESH;  Surgeon: Abigail Miyamotoouglas Blackman, MD;  Location: [facility];  Service: General;  Laterality: N/A;  . VENTRAL HERNIA REPAIR N/A 04/18/2016   Procedure:  VENTRAL HERNIA REPAIR;  Surgeon: Abigail Miyamotoouglas Blackman, MD;  Location: [facility];  Service: General;  Laterality: N/A;    No Known Allergies  Social History   Socioeconomic History  . Marital status: Single    Spouse name: Not on file  . Number of children: Not on file  . Years of education: Not on file  . Highest education level: Not on file  Social Needs  . Financial resource strain: Not on file  . Food insecurity - worry: Not on file  . Food insecurity - inability: Not on file  . Transportation needs - medical: Not on file  . Transportation needs - non-medical: Not on file  Occupational History  . Not on file  Tobacco Use  . Smoking status: Former Smoker    Packs/day: 0.25    Years: 15.00    Pack years: 3.75    Types: Cigarettes    Last  attempt to quit: 12/09/2014    Years since quitting: 3.0  . Smokeless tobacco: Never Used  Substance and Sexual Activity  . Alcohol use: Yes    Alcohol/week: 0.0 oz    Comment: occ  . Drug use: No  . Sexual activity: Not on file  Other Topics Concern  . Not on file  Social History Narrative  . Not on file    Family History  Problem Relation Age of Onset  . Diabetes Mother   . Hypertension Mother   . Hyperlipidemia Father   . Asthma Brother     BP 129/79   Ht 6\' 3"  (1.905 m)   Wt 164 lb (74.4 kg)   BMI 20.50 kg/m   Review of Systems: See HPI above.     Objective:  Physical Exam:  Gen: NAD, comfortable in exam room.  Right shoulder: No swelling, ecchymoses.   No gross deformity. No TTP. FROM with painful arc. Negative Hawkins, Neers. Negative Yergasons. Strength 5/5 with empty can and resisted internal/external rotation. Negative apprehension. NV intact distally.   Assessment & Plan:  1. Right shoulder pain - 2/2 rotator cuff impingement.  Continue home exercises.  Tylenol, aleve if needed.  Consider PT, nitro patches, injection as next steps.  F/u in 6 weeks or prn.

## 2018-01-09 NOTE — Assessment & Plan Note (Signed)
2/2 rotator cuff impingement.  Continue home exercises.  Tylenol, aleve if needed.  Consider PT, nitro patches, injection as next steps.  F/u in 6 weeks or prn.

## 2018-02-15 ENCOUNTER — Telehealth: Payer: Self-pay | Admitting: Pulmonary Disease

## 2018-02-15 MED ORDER — PREDNISONE 10 MG PO TABS: ORAL_TABLET | ORAL | 0 refills | Status: DC

## 2018-02-15 NOTE — Telephone Encounter (Signed)
Called and spoke to pt.  Pt reports of nasal congestion, prod cough with clear mucus & left side chest discomfort with coughing or movement x1-2w. Denies fever, chills, sweats or body aches.   BQ please advise. Thanks

## 2018-02-15 NOTE — Telephone Encounter (Signed)
Pt aware of recs.  rx sent to preferred pharmacy.  Nothing further needed.  

## 2018-02-15 NOTE — Telephone Encounter (Signed)
Prednisone Take 40mg  po daily for 3 days, then take 30mg  po daily for 3 days, then take 20mg  po daily for two days, then take 10mg  po daily for 2 days Call if no improvement or be seen if worse

## 2018-02-16 ENCOUNTER — Ambulatory Visit: Payer: 59 | Admitting: Family Medicine

## 2018-02-19 ENCOUNTER — Encounter (HOSPITAL_COMMUNITY): Payer: Self-pay | Admitting: Emergency Medicine

## 2018-02-19 ENCOUNTER — Emergency Department (HOSPITAL_COMMUNITY)
Admission: EM | Admit: 2018-02-19 | Discharge: 2018-02-19 | Disposition: A | Discharge status: Home / Self Care | Payer: 59 | Attending: Emergency Medicine | Admitting: Emergency Medicine

## 2018-02-19 ENCOUNTER — Emergency Department (HOSPITAL_COMMUNITY): Payer: 59

## 2018-02-19 DIAGNOSIS — Z79899 Other long term (current) drug therapy: Secondary | ICD-10-CM | POA: Insufficient documentation

## 2018-02-19 DIAGNOSIS — R9431 Abnormal electrocardiogram [ECG] [EKG]: Secondary | ICD-10-CM | POA: Diagnosis not present

## 2018-02-19 DIAGNOSIS — J45909 Unspecified asthma, uncomplicated: Secondary | ICD-10-CM | POA: Diagnosis not present

## 2018-02-19 DIAGNOSIS — R0789 Other chest pain: Secondary | ICD-10-CM

## 2018-02-19 DIAGNOSIS — F17210 Nicotine dependence, cigarettes, uncomplicated: Secondary | ICD-10-CM | POA: Insufficient documentation

## 2018-02-19 DIAGNOSIS — R079 Chest pain, unspecified: Secondary | ICD-10-CM | POA: Diagnosis present

## 2018-02-19 LAB — BASIC METABOLIC PANEL
Anion gap: 7 (ref 5–15)
BUN: 22 mg/dL — AB (ref 6–20)
CALCIUM: 9.4 mg/dL (ref 8.9–10.3)
CO2: 24 mmol/L (ref 22–32)
Chloride: 107 mmol/L (ref 101–111)
Creatinine, Ser: 1.2 mg/dL (ref 0.61–1.24)
GFR calc Af Amer: >60 mL/min (ref >60)
GLUCOSE: 98 mg/dL (ref 65–99)
Potassium: 4.6 mmol/L (ref 3.5–5.1)
Sodium: 138 mmol/L (ref 135–145)

## 2018-02-19 LAB — CBC
HEMATOCRIT: 46.2 % (ref 39.0–52.0)
Hemoglobin: 15.3 g/dL (ref 13.0–17.0)
MCH: 29.8 pg (ref 26.0–34.0)
MCHC: 33.1 g/dL (ref 30.0–36.0)
MCV: 89.9 fL (ref 78.0–100.0)
Platelets: 188 10*3/uL (ref 150–400)
RBC: 5.14 MIL/uL (ref 4.22–5.81)
RDW: 12.8 % (ref 11.5–15.5)
WBC: 4.7 10*3/uL (ref 4.0–10.5)

## 2018-02-19 LAB — I-STAT TROPONIN, ED
Troponin i, poc: 0 ng/mL (ref 0.00–0.08)
Troponin i, poc: 0 ng/mL (ref 0.00–0.08)

## 2018-02-19 MED ORDER — KETOROLAC TROMETHAMINE 30 MG/ML IJ SOLN
30.0000 mg | Freq: Once | INTRAMUSCULAR | Status: AC
  Administered 2018-02-19: 30 mg via INTRAVENOUS
  Filled 2018-02-19: qty 1

## 2018-02-19 MED ORDER — ASPIRIN 81 MG PO CHEW
324.0000 mg | CHEWABLE_TABLET | Freq: Once | ORAL | Status: AC
  Administered 2018-02-19: 324 mg via ORAL
  Filled 2018-02-19: qty 4

## 2018-02-19 MED ORDER — NITROGLYCERIN 0.4 MG SL SUBL: 0.4000 mg | SUBLINGUAL_TABLET | SUBLINGUAL | Status: DC | PRN

## 2018-02-19 MED ORDER — METHOCARBAMOL 500 MG PO TABS
1000.0000 mg | ORAL_TABLET | Freq: Once | ORAL | Status: AC
  Administered 2018-02-19: 1000 mg via ORAL
  Filled 2018-02-19: qty 2

## 2018-02-19 MED ORDER — IBUPROFEN 600 MG PO TABS: 600.0000 mg | ORAL_TABLET | Freq: Three times a day (TID) | ORAL | 0 refills | Status: DC

## 2018-02-19 MED ORDER — METHOCARBAMOL 500 MG PO TABS: 1000.0000 mg | ORAL_TABLET | Freq: Three times a day (TID) | ORAL | 0 refills | Status: DC | PRN

## 2018-02-19 NOTE — ED Provider Notes (Signed)
[facility]-EMERGENCY DEPT Provider Note   CSN: [id number] Arrival date & time: 02/19/18  0831     History   Chief Complaint Chief Complaint  Patient presents with  . Chest Pain    HPI Austin Curry is a 37 y.o. male.  HPI Patient presents with left-sided chest pain for the past week.  States is been intermittent but then became constant while heading to work this morning.  Describes the pain as tightness.  No radiation no shortness of breath or cough.  Denies any new lower extremity swelling or pain.  No nausea or vomiting.  Patient with a history of asthma.  No known close family members with coronary artery disease. Past Medical History:  Diagnosis Date  . Asthma   . Hernia     Patient Active Problem List   Diagnosis Date Noted  . Right shoulder pain 11/23/2017  . Status asthmaticus 04/08/2016  . Asthma exacerbation 04/08/2016  . Ventral hernia 01/12/2016  . Asthma, chronic 06/25/2013    Past Surgical History:  Procedure Laterality Date  . INSERTION OF MESH N/A 04/18/2016   Procedure: INSERTION OF MESH;  Surgeon: Abigail Miyamotoouglas Blackman, MD;  Location: [facility];  Service: General;  Laterality: N/A;  . VENTRAL HERNIA REPAIR N/A 04/18/2016   Procedure:  VENTRAL HERNIA REPAIR;  Surgeon: Abigail Miyamotoouglas Blackman, MD;  Location: [facility];  Service: General;  Laterality: N/A;       Home Medications    Prior to Admission medications   Medication Sig Start Date End Date Taking? Authorizing Provider  albuterol (PROVENTIL HFA;VENTOLIN HFA) 108 (90 Base) MCG/ACT inhaler Inhale 1-2 puffs into the lungs every 6 (six) hours as needed for wheezing or shortness of breath. 11/22/17  Yes Lupita LeashMcQuaid, Douglas B, MD  beclomethasone (QVAR) 80 MCG/ACT inhaler Inhale 2 puffs into the lungs 2 (two) times daily.   Yes [provider]  cetirizine (ZYRTEC) 10 MG tablet Take 10 mg by mouth daily.   Yes [provider]  predniSONE (DELTASONE) 10 MG tablet 40mg X3 days, 30mg   X3 days, 20mg  X2 days, 10mg X2 days, then stop. 02/15/18  Yes Lupita LeashMcQuaid, Douglas B, MD  albuterol (PROVENTIL) (2.5 MG/3ML) 0.083% nebulizer solution Take 3 mLs (2.5 mg total) by nebulization every 4 (four) hours as needed for wheezing or shortness of breath. Patient not taking: Reported on 02/19/2018 11/22/17   Lupita LeashMcQuaid, Douglas B, MD  budesonide-formoterol [facility](SYMBICORT) 160-4.5 MCG/ACT inhaler Inhale 2 puffs into the lungs 2 (two) times daily. Patient not taking: Reported on 02/19/2018 07/14/17   Gordy SaversKwiatkowski, Peter F, MD  diclofenac (VOLTAREN) 75 MG EC tablet Take 1 tablet (75 mg total) by mouth 2 (two) times daily. Patient not taking: Reported on 02/19/2018 11/22/17   Lenda KelpHudnall, Shane R, MD  fluticasone (FLONASE) 50 MCG/ACT nasal spray Place 1 spray into both nostrils daily. Patient not taking: Reported on 02/19/2018 04/08/16   Jeralyn BennettZamora, Ezequiel, MD  ibuprofen (ADVIL,MOTRIN) 600 MG tablet Take 1 tablet (600 mg total) by mouth 3 (three) times daily with meals. 02/19/18   Loren RacerYelverton, Waqas Bruhl, MD  lidocaine (LIDODERM) 5 % Place 1 patch onto the skin daily. Remove & Discard patch within 12 hours or as directed by MD Patient not taking: Reported on 02/19/2018 11/17/17   Austin LollLeaphart, Kenneth T, PA-C  methocarbamol (ROBAXIN) 500 MG tablet Take 2 tablets (1,000 mg total) by mouth every 8 (eight) hours as needed for muscle spasms. 02/19/18   Loren RacerYelverton, Shantrell Placzek, MD  naproxen (NAPROSYN) 375 MG tablet Take 1 tablet (375 mg total)  by mouth 2 (two) times daily. Patient not taking: Reported on 02/19/2018 11/17/17   Austin Curry T, PA-C  pantoprazole (PROTONIX) 40 MG tablet Take 1 tablet (40 mg total) by mouth daily. Patient not taking: Reported on 02/19/2018 07/14/17   Gordy Savers, MD    Family History Family History  Problem Relation Age of Onset  . Diabetes Mother   . Hypertension Mother   . Hyperlipidemia Father   . Asthma Brother     Social History Social History   Tobacco Use  . Smoking status: Current Every  Day Smoker    Packs/day: 0.25    Years: 15.00    Pack years: 3.75    Types: Cigarettes    Last attempt to quit: 12/09/2014    Years since quitting: 3.2  . Smokeless tobacco: Never Used  Substance Use Topics  . Alcohol use: Yes    Alcohol/week: 0.0 oz    Comment: occ  . Drug use: No     Allergies   Patient has no known allergies.   Review of Systems Review of Systems  Constitutional: Negative for chills and fever.  Respiratory: Positive for chest tightness. Negative for cough, shortness of breath and wheezing.   Cardiovascular: Positive for chest pain. Negative for palpitations and leg swelling.  Gastrointestinal: Negative for abdominal pain, constipation, diarrhea, nausea and vomiting.  Musculoskeletal: Negative for back pain, myalgias, neck pain and neck stiffness.  Skin: Negative for rash and wound.  Neurological: Negative for dizziness, weakness, light-headedness, numbness and headaches.  All other systems reviewed and are negative.    Physical Exam Updated Vital Signs BP 128/77   Pulse (!) 53   Temp 98.6 F (37 C) (Oral)   Resp 12   Ht 6\' 3"  (1.905 m)   Wt 74.4 kg (164 lb)   SpO2 100%   BMI 20.50 kg/m   Physical Exam  Constitutional: He is oriented to person, place, and time. He appears well-developed and well-nourished. No distress.  HENT:  Head: Normocephalic and atraumatic.  Mouth/Throat: Oropharynx is clear and moist. No oropharyngeal exudate.  Eyes: EOM are normal. Pupils are equal, round, and reactive to light.  Neck: Normal range of motion. Neck supple.  Cardiovascular: Normal rate and regular rhythm. Exam reveals no gallop and no friction rub.  No murmur heard. Pulmonary/Chest: Effort normal and breath sounds normal. No stridor. No respiratory distress. He has no wheezes. He has no rales. He exhibits tenderness.  Chest pain is reproduced with palpation of the left anterior chest wall.  There is no crepitance or deformity.  Abdominal: Soft. Bowel  sounds are normal. There is no tenderness. There is no rebound and no guarding.  Musculoskeletal: Normal range of motion. He exhibits no edema or tenderness.  No lower extremity swelling, asymmetry or tenderness.  Distal pulses are 2+.  Neurological: He is alert and oriented to person, place, and time.  Moves all extremities without focal deficit.  Sensation intact.  Skin: Skin is warm. Capillary refill takes less than 2 seconds. No rash noted. He is diaphoretic. No erythema.  Psychiatric: He has a normal mood and affect. His behavior is normal.  Nursing note and vitals reviewed.    ED Treatments / Results  Labs (all labs ordered are listed, but only abnormal results are displayed) Labs Reviewed  BASIC METABOLIC PANEL - Abnormal; Notable for the following components:      Result Value   BUN 22 (*)    All other components within normal limits  CBC  I-STAT TROPONIN, ED  I-STAT TROPONIN, ED    EKG  EKG Interpretation  Date/Time:  Monday February 19 2018 08:37:18 EST Ventricular Rate:  73 PR Interval:    QRS Duration: 96 QT Interval:  360 QTC Calculation: 397 R Axis:   84 Text Interpretation:  Sinus rhythm Probable left ventricular hypertrophy Inferior infarct, acute (LCx) ST elevation, consider anterior injury Lateral leads are also involved Baseline wander in lead(s) II aVR Confirmed by Loren Racer ([id number]) on 02/19/2018 8:42:21 AM Also confirmed by Loren Racer ([id number]), editor Barbette Hair [phone])  on 02/19/2018 1:19:12 PM       Radiology Dg Chest 2 View  Result Date: 02/19/2018 CLINICAL DATA:  One week of left-sided chest pain. History of asthma. Current smoker. EXAM: CHEST  2 VIEW COMPARISON:  PA and lateral chest x-ray of September 26, 2016 FINDINGS: The lungs are well-expanded with mild hemidiaphragm flattening. There is no focal infiltrate. There is no pneumothorax or pneumomediastinum. The heart and pulmonary vascularity are normal. There is no pleural effusion. The  bony thorax exhibits no acute abnormality. IMPRESSION: Borderline hyperinflation consistent with known reactive airway disease. No pneumonia nor other acute cardiopulmonary abnormality. Electronically Signed   By: Tatjana Turcott  [place] M.D.   On: 02/19/2018 09:26    Procedures Procedures (including critical care time)  Medications Ordered in ED Medications  aspirin chewable tablet 324 mg (324 mg Oral Given 02/19/18 0927)  ketorolac (TORADOL) 30 MG/ML injection 30 mg (30 mg Intravenous Given 02/19/18 1422)  methocarbamol (ROBAXIN) tablet 1,000 mg (1,000 mg Oral Given 02/19/18 1422)     Initial Impression / Assessment and Plan / ED Course  I have reviewed the triage vital signs and the nursing notes.  Pertinent labs & imaging results that were available during my care of the patient were reviewed by me and considered in my medical decision making (see chart for details).    Abnormal EKG with upsloping ST segment elevation in inferior leads and J-point elevation in anterior leads.  Compared to previous EKGs, similar morphology.  Pain is reproduced with palpation.  Consistent with chest wall etiology.  Troponin x2 is negative.  Will treat symptomatically.  I encouraged the patient to follow-up with a cardiology given the abnormal EKG and risk factors for coronary artery disease.  Understands need to return immediately for any worsening of his symptoms or concerns.  Final Clinical Impressions(s) / ED Diagnoses   Final diagnoses:  Chest wall pain  Abnormal EKG    ED Discharge Orders        Ordered    ibuprofen (ADVIL,MOTRIN) 600 MG tablet  3 times daily with meals     02/19/18 1422    methocarbamol (ROBAXIN) 500 MG tablet  Every 8 hours PRN     02/19/18 1422       Loren Racer, MD 02/20/18 [phone]

## 2018-02-19 NOTE — ED Notes (Addendum)
Okay to give medications prior to d/c since pt is driving self per Dr. YJeannie Fend

## 2018-02-19 NOTE — ED Triage Notes (Signed)
Patient c/o left sided chest pains that has been intermittent over past week. Today pt reports on way to work when chest pains started.

## 2018-02-23 ENCOUNTER — Ambulatory Visit (INDEPENDENT_AMBULATORY_CARE_PROVIDER_SITE_OTHER): Payer: 59 | Admitting: Family Medicine

## 2018-02-23 ENCOUNTER — Encounter: Payer: Self-pay | Admitting: Family Medicine

## 2018-02-23 DIAGNOSIS — M25511 Pain in right shoulder: Secondary | ICD-10-CM | POA: Diagnosis not present

## 2018-02-23 NOTE — Progress Notes (Signed)
PCP: Gordy Savers, MD  Subjective:   HPI: Patient is a 37 y.o. male here for right shoulder pain.  11/22/17: Patient reports he's had about 2 months of lateral right shoulder pain. No acute injury or trauma. Pain level up to 7/10 and sharp, stabbing. + night pain. 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 He woke up on 11/26 with swelling in this arm. Since Friday he noted tingling in 1st-3rd fingertips then moved into all fingers. Tried aleve with some benefit. No skin changes, numbness.  01/05/18: Patient reports he feels a little better compared to last visit. Doing home exercises and took diclofenac, now out of this. Doesn't lie on right side due to pain. Still hurts with reaching but not as bad as before. Pain level 0/10 currently. No skin changes, numbness.  3/1: Patient reports he feels much better. Pain level 0/10. Not requiring daily medication for his right shoulder. No skin changes, numbness.  Past Medical History:  Diagnosis Date  . Asthma   . Hernia     Current Outpatient Medications on File Prior to Visit  Medication Sig Dispense Refill  . albuterol (PROVENTIL HFA;VENTOLIN HFA) 108 (90 Base) MCG/ACT inhaler Inhale 1-2 puffs into the lungs every 6 (six) hours as needed for wheezing or shortness of breath. 1 Inhaler 5  . albuterol (PROVENTIL) (2.5 MG/3ML) 0.083% nebulizer solution Take 3 mLs (2.5 mg total) by nebulization every 4 (four) hours as needed for wheezing or shortness of breath. (Patient not taking: Reported on 02/19/2018) 30 vial 5  . beclomethasone (QVAR) 80 MCG/ACT inhaler Inhale 2 puffs into the lungs 2 (two) times daily.    . budesonide-formoterol (SYMBICORT) 160-4.5 MCG/ACT inhaler Inhale 2 puffs into the lungs 2 (two) times daily. (Patient not taking: Reported on 02/19/2018) 1 Inhaler 11  . cetirizine (ZYRTEC) 10 MG tablet Take 10 mg by mouth daily.    [name] diltiazem (CARDIZEM) 60 MG tablet Take 60 mg by mouth 2 (two) times daily.  1   . fluticasone (FLONASE) 50 MCG/ACT nasal spray Place 1 spray into both nostrils daily. (Patient not taking: Reported on 02/19/2018) 16 g 2  . lidocaine (LIDODERM) 5 % Place 1 patch onto the skin daily. Remove & Discard patch within 12 hours or as directed by MD (Patient not taking: Reported on 02/19/2018) 10 patch 0  . methocarbamol (ROBAXIN) 500 MG tablet Take 2 tablets (1,000 mg total) by mouth every 8 (eight) hours as needed for muscle spasms. 30 tablet 0  . pantoprazole (PROTONIX) 40 MG tablet Take 1 tablet (40 mg total) by mouth daily. (Patient not taking: Reported on 02/19/2018) 30 tablet 5   No current facility-administered medications on file prior to visit.     Past Surgical History:  Procedure Laterality Date  . INSERTION OF MESH N/A 04/18/2016   Procedure: INSERTION OF MESH;  Surgeon: Abigail Miyamoto, MD;  Location: [facility];  Service: General;  Laterality: N/A;  . VENTRAL HERNIA REPAIR N/A 04/18/2016   Procedure:  VENTRAL HERNIA REPAIR;  Surgeon: Abigail Miyamoto, MD;  Location: [facility];  Service: General;  Laterality: N/A;    No Known Allergies  Social History   Socioeconomic History  . Marital status: Single    Spouse name: Not on file  . Number of children: Not on file  . Years of education: Not on file  . Highest education level: Not on file  Social Needs  . Financial resource strain: Not on file  . Food insecurity - worry:  Not on file  . Food insecurity - inability: Not on file  . Transportation needs - medical: Not on file  . Transportation needs - non-medical: Not on file  Occupational History  . Not on file  Tobacco Use  . Smoking status: Current Every Day Smoker    Packs/day: 0.25    Years: 15.00    Pack years: 3.75    Types: Cigarettes    Last attempt to quit: 12/09/2014    Years since quitting: 3.2  . Smokeless tobacco: Never Used  Substance and Sexual Activity  . Alcohol use: Yes    Alcohol/week: 0.0 oz    Comment: occ  . Drug use: No  . Sexual  activity: Not on file  Other Topics Concern  . Not on file  Social History Narrative  . Not on file    Family History  Problem Relation Age of Onset  . Diabetes Mother   . Hypertension Mother   . Hyperlipidemia Father   . Asthma Brother     BP 123/86   Pulse (!) 58   Ht 6\' 3"  (1.905 m)   Wt 169 lb (76.7 kg)   BMI 21.12 kg/m   Review of Systems: See HPI above.     Objective:  Physical Exam:  Gen: NAD, comfortable in exam room.  Right shoulder: No swelling, ecchymoses.  No gross deformity. No TTP. FROM. Negative Hawkins, Neers. Negative Yergasons. Strength 5/5 with empty can and resisted internal/external rotation. Negative apprehension. NV intact distally.   Assessment & Plan:  1. Right shoulder pain - 2/2 rotator cuff impingement.  Much improved.  Continue home exercises 2x/week for 4-6 more weeks.  Tylenol if needed at this point.  F/u prn.

## 2018-02-23 NOTE — Assessment & Plan Note (Signed)
2/2 rotator cuff impingement.  Much improved.  Continue home exercises 2x/week for 4-6 more weeks.  Tylenol if needed at this point.  F/u prn.

## 2018-02-23 NOTE — Patient Instructions (Signed)
Do the home exercises a couple times a week for about 4-6 more weeks. Call me if you need anything otherwise follow up as needed.

## 2018-03-07 ENCOUNTER — Telehealth: Payer: Self-pay | Admitting: Family Medicine

## 2018-03-07 DIAGNOSIS — J4531 Mild persistent asthma with (acute) exacerbation: Secondary | ICD-10-CM

## 2018-03-07 NOTE — Telephone Encounter (Signed)
Could not get in contact with patient phone number is not working.

## 2018-03-07 NOTE — Telephone Encounter (Signed)
Copied from CRM [id number]. Topic: Referral - Request >> Mar 07, 2018  3:39 PM Guinevere FerrariMorris, Sharamare E, NT wrote: Reason for CRM: Patient called and wanted to see if he can get another referral to a cardiologist because he is not satisfied with current doctor. Pt [facility] or [facility]

## 2018-03-07 NOTE — Telephone Encounter (Signed)
Pt called back - he said that [phone] is the best number please call back

## 2018-03-08 NOTE — Telephone Encounter (Signed)
Spoke to patient and informed him that [facility] and will return his phone call as soon as I get an update.

## 2018-03-09 NOTE — Telephone Encounter (Signed)
Okay for cardiology consultation of patient's choice; please schedule 2D echocardiogram

## 2018-03-09 NOTE — Telephone Encounter (Signed)
Spoke with patient and he will call me back for preferred office.

## 2018-03-12 NOTE — Telephone Encounter (Signed)
Patient notified of referral

## 2018-03-12 NOTE — Addendum Note (Signed)
Addended by: Waymon AmatoJOHNSON, Dameka Younker R on: 03/12/2018 12:02 PM   Modules accepted: Orders

## 2018-03-12 NOTE — Telephone Encounter (Signed)
Spoke to patient with patient and his preferred Cardiologist is Dr. Antoine PocheHochrein. Putting tin the referral now. No further action needed.

## 2018-04-06 ENCOUNTER — Ambulatory Visit: Payer: 59 | Admitting: Cardiology

## 2018-04-09 ENCOUNTER — Encounter: Payer: Self-pay | Admitting: *Deleted

## 2018-04-19 ENCOUNTER — Other Ambulatory Visit: Payer: Self-pay

## 2018-04-19 ENCOUNTER — Encounter (HOSPITAL_COMMUNITY): Payer: Self-pay | Admitting: Emergency Medicine

## 2018-04-19 ENCOUNTER — Ambulatory Visit (HOSPITAL_COMMUNITY)
Admission: EM | Admit: 2018-04-19 | Discharge: 2018-04-19 | Disposition: A | Discharge status: Home / Self Care | Payer: 59 | Attending: Internal Medicine | Admitting: Internal Medicine

## 2018-04-19 DIAGNOSIS — R0789 Other chest pain: Secondary | ICD-10-CM

## 2018-04-19 NOTE — Discharge Instructions (Addendum)
Please take tylenol/ibuprofen for chest discomfort.   Go to emergency room if chest pain worsening, coming and staying, or not improving. Noticing associated nausea, dizziness, lightheadedness, numbness.

## 2018-04-19 NOTE — ED Triage Notes (Signed)
Sharp intermittent chest pain on left chest.  Seen at [facility].  Has been having discomfort intermittently all day.

## 2018-04-20 NOTE — ED Provider Notes (Signed)
[facility]    CSN: [id number] Arrival date & time: 04/19/18  1905     History   Chief Complaint Chief Complaint  Patient presents with  . Chest Pain    HPI Austin Curry is a 37 y.o. male history of asthma presenting today for evaluation of left-sided chest pain.  Chest pain began earlier today as he was at work.  Patient works as a Ecologist.  He states that the pain feels like a sharp stabbing pain/pinching pain.  After work he went home and rested, but when he woke up the pain was still present.  The pain has eased up but is still present.  Patient does not have any history of high blood pressure or diabetes.  Denies smoking, states he recently quit.  Patient had a similar episode of chest pain at the end of February and was seen at [facility] with a negative work-up.  Since patient has followed up with cardiology and had an echocardiogram.  Patient is not completely sure exactly what he is being treated for, but he was started on metoprolol and diltiazem as well as Robaxin.  He believes the echo showed thickening of his heart wall.  Denies history of syncope while playing sports or exercise.  Denies family history does from a heart attack at early age, does note his uncle passed away during open heart surgery, but he is unsure of his age.  Denies drug use/cocaine use.  HPI  Past Medical History:  Diagnosis Date  . Asthma   . Hernia     Patient Active Problem List   Diagnosis Date Noted  . Right shoulder pain 11/23/2017  . Status asthmaticus 04/08/2016  . Asthma exacerbation 04/08/2016  . Ventral hernia 01/12/2016  . Asthma, chronic 06/25/2013    Past Surgical History:  Procedure Laterality Date  . INSERTION OF MESH N/A 04/18/2016   Procedure: INSERTION OF MESH;  Surgeon: Abigail Miyamoto, MD;  Location: [facility];  Service: General;  Laterality: N/A;  . VENTRAL HERNIA REPAIR N/A 04/18/2016   Procedure:  VENTRAL HERNIA REPAIR;  Surgeon:  Abigail Miyamoto, MD;  Location: [facility];  Service: General;  Laterality: N/A;       Home Medications    Prior to Admission medications   Medication Sig Start Date End Date Taking? Authorizing Provider  METOPROLOL SUCCINATE PO Take by mouth.   Yes [provider]  albuterol (PROVENTIL HFA;VENTOLIN HFA) 108 (90 Base) MCG/ACT inhaler Inhale 1-2 puffs into the lungs every 6 (six) hours as needed for wheezing or shortness of breath. 11/22/17   Lupita Leash, MD  albuterol (PROVENTIL) (2.5 MG/3ML) 0.083% nebulizer solution Take 3 mLs (2.5 mg total) by nebulization every 4 (four) hours as needed for wheezing or shortness of breath. Patient not taking: Reported on 02/19/2018 11/22/17   Lupita Leash, MD  beclomethasone (QVAR) 80 MCG/ACT inhaler Inhale 2 puffs into the lungs 2 (two) times daily.    [provider]  budesonide-formoterol (SYMBICORT) 160-4.5 MCG/ACT inhaler Inhale 2 puffs into the lungs 2 (two) times daily. Patient not taking: Reported on 02/19/2018 07/14/17   Gordy Savers, MD  cetirizine (ZYRTEC) 10 MG tablet Take 10 mg by mouth daily.    [provider]  diltiazem (CARDIZEM) 60 MG tablet Take 60 mg by mouth 2 (two) times daily. 02/21/18   [provider]  fluticasone (FLONASE) 50 MCG/ACT nasal spray Place 1 spray into both nostrils daily. Patient not taking:  Reported on 02/19/2018 04/08/16   Jeralyn BennettZamora, Ezequiel, MD  lidocaine (LIDODERM) 5 % Place 1 patch onto the skin daily. Remove & Discard patch within 12 hours or as directed by MD Patient not taking: Reported on 02/19/2018 11/17/17   Demetrios LollLeaphart, Kenneth T, PA-C  methocarbamol (ROBAXIN) 500 MG tablet Take 2 tablets (1,000 mg total) by mouth every 8 (eight) hours as needed for muscle spasms. 02/19/18   Loren RacerYelverton, David, MD  pantoprazole (PROTONIX) 40 MG tablet Take 1 tablet (40 mg total) by mouth daily. Patient not taking: Reported on 02/19/2018 07/14/17   Gordy SaversKwiatkowski, Peter F, MD    Family  History Family History  Problem Relation Age of Onset  . Diabetes Mother   . Hypertension Mother   . Hyperlipidemia Father   . Asthma Brother     Social History Social History   Tobacco Use  . Smoking status: Current Every Day Smoker    Packs/day: 0.25    Years: 15.00    Pack years: 3.75    Types: Cigarettes    Last attempt to quit: 12/09/2014    Years since quitting: 3.3  . Smokeless tobacco: Never Used  Substance Use Topics  . Alcohol use: Yes    Alcohol/week: 0.0 oz    Comment: occ  . Drug use: No     Allergies   Patient has no known allergies.   Review of Systems Review of Systems  Constitutional: Negative for fatigue and fever.  HENT: Negative for congestion and sore throat.   Eyes: Negative for visual disturbance.  Respiratory: Negative for cough and shortness of breath.   Cardiovascular: Positive for chest pain.  Gastrointestinal: Negative for abdominal pain, diarrhea, nausea and vomiting.  Musculoskeletal: Negative for myalgias.  Skin: Negative for color change and rash.  Neurological: Negative for dizziness, syncope, weakness, light-headedness, numbness and headaches.     Physical Exam Triage Vital Signs ED Triage Vitals  Enc Vitals Group     BP 04/19/18 1954 118/63     Pulse Rate 04/19/18 1954 62     Resp 04/19/18 1954 18     Temp 04/19/18 1954 98.4 F (36.9 C)     Temp Source 04/19/18 1954 Oral     SpO2 04/19/18 1954 100 %     Weight --      Height --      Head Circumference --      Peak Flow --      Pain Score 04/19/18 1952 8     Pain Loc --      Pain Edu? --      Excl. in GC? --    No data found.  Updated Vital Signs BP 118/63 (BP Location: Left Arm)   Pulse 62   Temp 98.4 F (36.9 C) (Oral)   Resp 18   SpO2 100%   Visual Acuity Right Eye Distance:   Left Eye Distance:   Bilateral Distance:    Right Eye Near:   Left Eye Near:    Bilateral Near:     Physical Exam  Constitutional: He is oriented to person, place, and  time. He appears well-developed and well-nourished.  HENT:  Head: Normocephalic and atraumatic.  Mouth/Throat: Oropharynx is clear and moist.  Eyes: Conjunctivae are normal.  Neck: Neck supple.  Cardiovascular: Normal rate and regular rhythm.  No murmur heard. No murmur appreciated  Pulmonary/Chest: Effort normal and breath sounds normal. No respiratory distress.  CTABL, chest discomfort reproducible with palpation of left superior lateral area of chest/pectoral  muscle near insertion.  Palpation produced a numbness feeling in his left hand.    Abdominal: Soft. There is no tenderness.  Musculoskeletal: He exhibits no edema.  Neurological: He is alert and oriented to person, place, and time. No cranial nerve deficit.  Skin: Skin is warm and dry.  Psychiatric: He has a normal mood and affect.  Nursing note and vitals reviewed.    UC Treatments / Results  Labs (all labs ordered are listed, but only abnormal results are displayed) Labs Reviewed - No data to display  EKG-EKG sinus bradycardia at 57.  No signs of ischemia or infarction. None Radiology No results found.  Procedures Procedures (including critical care time)  Medications Ordered in UC Medications - No data to display   Initial Impression / Assessment and Plan / UC Course  I have reviewed the triage vital signs and the nursing notes.  Pertinent labs & imaging results that were available during my care of the patient were reviewed by me and considered in my medical decision making (see chart for details).     Patient with chest pain.  Chest pain was reproducible on exam, similar to evaluation at [facility] previously.  Vital signs stable without elevated blood pressure or tachycardia.  EKG without abnormality.  Unclear cause of why patient is on cardiac medicines as evaluation by cardiology not in chart.  Possible hypertrophic cardiomyopathy versus restrictive cardia myopathy.  Based off evaluation in clinic today  feel patient is lower risk for cardiac source of chest pain, but given his unclear cardiac history and description of chest pain for further work-up may be warranted.  Discussed with patient about troponins being the only way to definitely ensure something cardiac is not going on.  As patient is having minimal chest pain at this time will have patient continue to monitor her symptoms go to emergency room if symptoms are worsening or returning, follow-up with PCP/cardiology.   Final Clinical Impressions(s) / UC Diagnoses   Final diagnoses:  Atypical chest pain    ED Discharge Orders    None       Controlled Substance Prescriptions [place] Controlled Substance Registry consulted? Not Applicable   Lew Dawes, [place] 04/20/18 [phone]

## 2018-05-02 ENCOUNTER — Other Ambulatory Visit: Payer: Self-pay

## 2018-05-02 ENCOUNTER — Emergency Department (HOSPITAL_BASED_OUTPATIENT_CLINIC_OR_DEPARTMENT_OTHER)
Admission: EM | Admit: 2018-05-02 | Discharge: 2018-05-02 | Disposition: A | Discharge status: Home / Self Care | Payer: 59 | Attending: Emergency Medicine | Admitting: Emergency Medicine

## 2018-05-02 ENCOUNTER — Encounter (HOSPITAL_BASED_OUTPATIENT_CLINIC_OR_DEPARTMENT_OTHER): Payer: Self-pay

## 2018-05-02 DIAGNOSIS — R519 Headache, unspecified: Secondary | ICD-10-CM

## 2018-05-02 DIAGNOSIS — J45909 Unspecified asthma, uncomplicated: Secondary | ICD-10-CM | POA: Diagnosis not present

## 2018-05-02 DIAGNOSIS — F17210 Nicotine dependence, cigarettes, uncomplicated: Secondary | ICD-10-CM | POA: Insufficient documentation

## 2018-05-02 DIAGNOSIS — Z79899 Other long term (current) drug therapy: Secondary | ICD-10-CM | POA: Insufficient documentation

## 2018-05-02 DIAGNOSIS — R51 Headache: Secondary | ICD-10-CM | POA: Insufficient documentation

## 2018-05-02 MED ORDER — KETOROLAC TROMETHAMINE 15 MG/ML IJ SOLN
15.0000 mg | Freq: Once | INTRAMUSCULAR | Status: AC
  Administered 2018-05-02: 15 mg via INTRAVENOUS
  Filled 2018-05-02: qty 1

## 2018-05-02 MED ORDER — SODIUM CHLORIDE 0.9 % IV BOLUS
1000.0000 mL | Freq: Once | INTRAVENOUS | Status: AC
  Administered 2018-05-02: 1000 mL via INTRAVENOUS

## 2018-05-02 MED ORDER — DIPHENHYDRAMINE HCL 50 MG/ML IJ SOLN
12.5000 mg | Freq: Once | INTRAMUSCULAR | Status: AC
  Administered 2018-05-02: 12.5 mg via INTRAVENOUS
  Filled 2018-05-02: qty 1

## 2018-05-02 MED ORDER — DEXAMETHASONE SODIUM PHOSPHATE 10 MG/ML IJ SOLN
10.0000 mg | Freq: Once | INTRAMUSCULAR | Status: AC
  Administered 2018-05-02: 10 mg via INTRAVENOUS
  Filled 2018-05-02: qty 1

## 2018-05-02 MED ORDER — METOCLOPRAMIDE HCL 5 MG/ML IJ SOLN
10.0000 mg | Freq: Once | INTRAMUSCULAR | Status: AC
  Administered 2018-05-02: 10 mg via INTRAVENOUS
  Filled 2018-05-02: qty 2

## 2018-05-02 NOTE — ED Triage Notes (Signed)
Headache with photophobia since this morning that is unrelieved by [employer]

## 2018-05-02 NOTE — ED Notes (Signed)
Warm blanket provided.

## 2018-05-02 NOTE — ED Provider Notes (Signed)
[facility] EMERGENCY DEPARTMENT Provider Note   CSN: [id number] Arrival date & time: 05/02/18  1742    History   Chief Complaint Chief Complaint  Patient presents with  . Headache    HPI Austin Curry is a 37 y.o. male who presents with a headache.  No significant past medical history.  He states that he has had a headache that is gradually worsened over the past several days.  Is over the left side of his head and throbbing in nature.  He reports associated photophobia.  Is took Aleve without relief.  He has had headaches in the past but does not feel similar.  He denies vision changes, nausea or vomiting.  He denies history of migraines.  He denies feeling dehydrated or weak.  He denies numbness or tingling or neck pain or fever. Denies LOC, trauma, neck stiffness, acute onset, worst headache of life.   HPI  Past Medical History:  Diagnosis Date  . Asthma   . Hernia     Patient Active Problem List   Diagnosis Date Noted  . Right shoulder pain 11/23/2017  . Status asthmaticus 04/08/2016  . Asthma exacerbation 04/08/2016  . Ventral hernia 01/12/2016  . Asthma, chronic 06/25/2013    Past Surgical History:  Procedure Laterality Date  . INSERTION OF MESH N/A 04/18/2016   Procedure: INSERTION OF MESH;  Surgeon: Abigail Miyamoto, MD;  Location: [facility];  Service: General;  Laterality: N/A;  . VENTRAL HERNIA REPAIR N/A 04/18/2016   Procedure:  VENTRAL HERNIA REPAIR;  Surgeon: Abigail Miyamoto, MD;  Location: [facility];  Service: General;  Laterality: N/A;        Home Medications    Prior to Admission medications   Medication Sig Start Date End Date Taking? Authorizing Provider  albuterol (PROVENTIL HFA;VENTOLIN HFA) 108 (90 Base) MCG/ACT inhaler Inhale 1-2 puffs into the lungs every 6 (six) hours as needed for wheezing or shortness of breath. 11/22/17  Yes Lupita Leash, MD  cetirizine (ZYRTEC) 10 MG tablet Take 10 mg by mouth daily.   Yes [provider]  diltiazem (CARDIZEM) 60 MG tablet Take 60 mg by mouth 2 (two) times daily. 02/21/18  Yes [provider]  fluticasone (FLONASE) 50 MCG/ACT nasal spray Place 1 spray into both nostrils daily. 04/08/16  Yes Jeralyn Bennett, MD  methocarbamol (ROBAXIN) 500 MG tablet Take 2 tablets (1,000 mg total) by mouth every 8 (eight) hours as needed for muscle spasms. 02/19/18  Yes Loren Racer, MD  METOPROLOL SUCCINATE PO Take by mouth.   Yes [provider]  albuterol (PROVENTIL) (2.5 MG/3ML) 0.083% nebulizer solution Take 3 mLs (2.5 mg total) by nebulization every 4 (four) hours as needed for wheezing or shortness of breath. Patient not taking: Reported on 02/19/2018 11/22/17   Lupita Leash, MD  beclomethasone (QVAR) 80 MCG/ACT inhaler Inhale 2 puffs into the lungs 2 (two) times daily.    [provider]  budesonide-formoterol (SYMBICORT) 160-4.5 MCG/ACT inhaler Inhale 2 puffs into the lungs 2 (two) times daily. Patient not taking: Reported on 02/19/2018 07/14/17   Gordy Savers, MD  lidocaine (LIDODERM) 5 % Place 1 patch onto the skin daily. Remove & Discard patch within 12 hours or as directed by MD Patient not taking: Reported on 02/19/2018 11/17/17   Demetrios Loll T, PA-C  pantoprazole (PROTONIX) 40 MG tablet Take 1 tablet (40 mg total) by mouth daily. Patient not taking: Reported on 02/19/2018 07/14/17   Gordy Savers, MD  Family History Family History  Problem Relation Age of Onset  . Diabetes Mother   . Hypertension Mother   . Hyperlipidemia Father   . Asthma Brother     Social History Social History   Tobacco Use  . Smoking status: Current Every Day Smoker    Packs/day: 0.25    Years: 15.00    Pack years: 3.75    Types: Cigarettes    Last attempt to quit: 12/09/2014    Years since quitting: 3.3  . Smokeless tobacco: Never Used  Substance Use Topics  . Alcohol use: Yes    Alcohol/week: 0.0 oz    Comment: occ  . Drug  use: No     Allergies   Patient has no known allergies.   Review of Systems Review of Systems  Constitutional: Negative for chills and fever.  Eyes: Positive for photophobia. Negative for visual disturbance.  Gastrointestinal: Negative for nausea and vomiting.  Musculoskeletal: Negative for neck pain.  Neurological: Positive for headaches. Negative for weakness and numbness.  All other systems reviewed and are negative.    Physical Exam Updated Vital Signs BP 121/78 (BP Location: Right Arm)   Pulse (!) 51   Temp 98.7 F (37.1 C) (Oral)   Resp 18   Ht  (1.854 m)   Wt 77.1 kg (170 lb)   SpO2 98%   BMI 22.43 kg/m   Physical Exam  Constitutional: He is oriented to person, place, and time. He appears well-developed and well-nourished. No distress.  HENT:  Head: Normocephalic and atraumatic.  Eyes: Pupils are equal, round, and reactive to light. Conjunctivae are normal. Right eye exhibits no discharge. Left eye exhibits no discharge. No scleral icterus.  Neck: Normal range of motion.  Cardiovascular: Normal rate.  Pulmonary/Chest: Effort normal. No respiratory distress.  Abdominal: He exhibits no distension.  Neurological: He is alert and oriented to person, place, and time.  Lying on stretcher in NAD. GCS 15. Speaks in a clear voice. Cranial nerves II through XII grossly intact. 5/5 strength in all extremities. Sensation fully intact.  Bilateral finger-to-nose intact. Ambulatory    Skin: Skin is warm and dry.  Psychiatric: He has a normal mood and affect. His behavior is normal.  Nursing note and vitals reviewed.    ED Treatments / Results  Labs (all labs ordered are listed, but only abnormal results are displayed) Labs Reviewed - No data to display  EKG None  Radiology No results found.  Procedures Procedures (including critical care time)  Medications Ordered in ED Medications  sodium chloride 0.9 % bolus 1,000 mL (0 mLs Intravenous Stopped 05/02/18  2209)  ketorolac (TORADOL) 15 MG/ML injection 15 mg (15 mg Intravenous Given 05/02/18 2106)  metoCLOPramide (REGLAN) injection 10 mg (10 mg Intravenous Given 05/02/18 2105)  diphenhydrAMINE (BENADRYL) injection 12.5 mg (12.5 mg Intravenous Given 05/02/18 2110)  dexamethasone (DECADRON) injection 10 mg (10 mg Intravenous Given 05/02/18 2108)     Initial Impression / Assessment and Plan / ED Course  I have reviewed the triage vital signs and the nursing notes.  Pertinent labs & imaging results that were available during my care of the patient were reviewed by me and considered in my medical decision making (see chart for details).  37 year old male presents with a headache. His vitals are normal. His neurologic exam is unremarkable. No red flags on exam or in history to warrant imaging or further work up. He was given a headache cocktail. Afterwards he felt better. He  was discharged in good condition.  Final Clinical Impressions(s) / ED Diagnoses   Final diagnoses:  Bad headache    ED Discharge Orders    None       Bethel Born, PA-C 05/03/18 0030    Pricilla Loveless, MD 05/08/18 [id number]

## 2018-05-02 NOTE — ED Notes (Signed)
ED Provider at bedside. 

## 2018-05-07 ENCOUNTER — Emergency Department (HOSPITAL_COMMUNITY): Payer: 59

## 2018-05-07 ENCOUNTER — Encounter (HOSPITAL_COMMUNITY): Payer: Self-pay | Admitting: Emergency Medicine

## 2018-05-07 ENCOUNTER — Emergency Department (HOSPITAL_COMMUNITY)
Admission: EM | Admit: 2018-05-07 | Discharge: 2018-05-07 | Disposition: A | Discharge status: Home / Self Care | Payer: 59 | Attending: Emergency Medicine | Admitting: Emergency Medicine

## 2018-05-07 DIAGNOSIS — F17210 Nicotine dependence, cigarettes, uncomplicated: Secondary | ICD-10-CM | POA: Diagnosis not present

## 2018-05-07 DIAGNOSIS — R519 Headache, unspecified: Secondary | ICD-10-CM

## 2018-05-07 DIAGNOSIS — Z79899 Other long term (current) drug therapy: Secondary | ICD-10-CM | POA: Diagnosis not present

## 2018-05-07 DIAGNOSIS — R51 Headache: Secondary | ICD-10-CM | POA: Insufficient documentation

## 2018-05-07 DIAGNOSIS — J45909 Unspecified asthma, uncomplicated: Secondary | ICD-10-CM | POA: Diagnosis not present

## 2018-05-07 MED ORDER — DEXAMETHASONE SODIUM PHOSPHATE 4 MG/ML IJ SOLN
4.0000 mg | Freq: Once | INTRAMUSCULAR | Status: AC
  Administered 2018-05-07: 4 mg via INTRAVENOUS
  Filled 2018-05-07: qty 1

## 2018-05-07 MED ORDER — SODIUM CHLORIDE 0.9 % IV BOLUS
1000.0000 mL | Freq: Once | INTRAVENOUS | Status: AC
  Administered 2018-05-07: 1000 mL via INTRAVENOUS

## 2018-05-07 MED ORDER — MAGNESIUM SULFATE IN D5W 1-5 GM/100ML-% IV SOLN
1.0000 g | Freq: Once | INTRAVENOUS | Status: AC
  Administered 2018-05-07: 1 g via INTRAVENOUS
  Filled 2018-05-07 (×2): qty 100

## 2018-05-07 MED ORDER — METOCLOPRAMIDE HCL 5 MG/ML IJ SOLN
10.0000 mg | Freq: Once | INTRAMUSCULAR | Status: AC
  Administered 2018-05-07: 10 mg via INTRAVENOUS
  Filled 2018-05-07: qty 2

## 2018-05-07 MED ORDER — HALOPERIDOL LACTATE 5 MG/ML IJ SOLN
5.0000 mg | Freq: Once | INTRAMUSCULAR | Status: DC
  Filled 2018-05-07: qty 1

## 2018-05-07 MED ORDER — KETOROLAC TROMETHAMINE 30 MG/ML IJ SOLN
30.0000 mg | Freq: Once | INTRAMUSCULAR | Status: AC
  Administered 2018-05-07: 30 mg via INTRAVENOUS
  Filled 2018-05-07: qty 1

## 2018-05-07 MED ORDER — VALPROATE SODIUM 500 MG/5ML IV SOLN
500.0000 mg | Freq: Once | INTRAVENOUS | Status: AC
  Administered 2018-05-07: 500 mg via INTRAVENOUS
  Filled 2018-05-07: qty 5

## 2018-05-07 NOTE — Discharge Instructions (Signed)
Please follow-up with one of the neurology offices as soon as possible for further evaluation and treatment.  If you cannot be seen in the next 1 to 2 weeks, please follow-up with your doctor about these headaches.  Please return to emergency department if you develop any new or worsening symptoms.

## 2018-05-07 NOTE — ED Provider Notes (Signed)
[facility] EMERGENCY DEPARTMENT Provider Note   CSN: [id number] Arrival date & time: 05/07/18  0308     History   Chief Complaint Chief Complaint  Patient presents with  . Headache    HPI Austin Curry is a 37 y.o. male with history of asthma, headaches who presents with a one-week history of right-sided headache.  He reports his headache has been waxing and waning for the past week.  It is throbbing.  It is behind his eye as well. He has had associated photophobia and tearing. he has been taking ibuprofen, Aleve, and Tylenol at home without relief.  He was seen at [facility] given headache cocktail on 05/02/2018, which improved his symptoms temporarily, but the headache came back and has been persistent.  He reports it woke him out of sleep the past 2 nights.  He denies any neck pain or fever.   Headache began gradually does not reach intensity in seconds.  He reports he has not had a headache that lasted this long or to this severity.  He does not remember the last headache that he had.  HPI  Past Medical History:  Diagnosis Date  . Asthma   . Hernia     Patient Active Problem List   Diagnosis Date Noted  . Right shoulder pain 11/23/2017  . Status asthmaticus 04/08/2016  . Asthma exacerbation 04/08/2016  . Ventral hernia 01/12/2016  . Asthma, chronic 06/25/2013    Past Surgical History:  Procedure Laterality Date  . INSERTION OF MESH N/A 04/18/2016   Procedure: INSERTION OF MESH;  Surgeon: Abigail Miyamoto, MD;  Location: [facility] ORS;  Service: General;  Laterality: N/A;  . VENTRAL HERNIA REPAIR N/A 04/18/2016   Procedure:  VENTRAL HERNIA REPAIR;  Surgeon: Abigail Miyamoto, MD;  Location: [facility];  Service: General;  Laterality: N/A;        Home Medications    Prior to Admission medications   Medication Sig Start Date End Date Taking? Authorizing Provider  albuterol (PROVENTIL HFA;VENTOLIN HFA) 108 (90 Base) MCG/ACT inhaler Inhale 1-2 puffs  into the lungs every 6 (six) hours as needed for wheezing or shortness of breath. 11/22/17   Lupita Leash, MD  albuterol (PROVENTIL) (2.5 MG/3ML) 0.083% nebulizer solution Take 3 mLs (2.5 mg total) by nebulization every 4 (four) hours as needed for wheezing or shortness of breath. Patient not taking: Reported on 02/19/2018 11/22/17   Lupita Leash, MD  beclomethasone (QVAR) 80 MCG/ACT inhaler Inhale 2 puffs into the lungs 2 (two) times daily.    [provider]  budesonide-formoterol (SYMBICORT) 160-4.5 MCG/ACT inhaler Inhale 2 puffs into the lungs 2 (two) times daily. Patient not taking: Reported on 02/19/2018 07/14/17   Gordy Savers, MD  cetirizine (ZYRTEC) 10 MG tablet Take 10 mg by mouth daily.    [provider]  diltiazem (CARDIZEM) 60 MG tablet Take 60 mg by mouth 2 (two) times daily. 02/21/18   [provider]  fluticasone (FLONASE) 50 MCG/ACT nasal spray Place 1 spray into both nostrils daily. 04/08/16   Jeralyn Bennett, MD  lidocaine (LIDODERM) 5 % Place 1 patch onto the skin daily. Remove & Discard patch within 12 hours or as directed by MD Patient not taking: Reported on 02/19/2018 11/17/17   Demetrios Loll T, PA-C  methocarbamol (ROBAXIN) 500 MG tablet Take 2 tablets (1,000 mg total) by mouth every 8 (eight) hours as needed for muscle spasms. 02/19/18   Loren Racer, MD  METOPROLOL  SUCCINATE PO Take by mouth.    [provider]  pantoprazole (PROTONIX) 40 MG tablet Take 1 tablet (40 mg total) by mouth daily. Patient not taking: Reported on 02/19/2018 07/14/17   Gordy Savers, MD    Family History Family History  Problem Relation Age of Onset  . Diabetes Mother   . Hypertension Mother   . Hyperlipidemia Father   . Asthma Brother     Social History Social History   Tobacco Use  . Smoking status: Current Every Day Smoker    Packs/day: 0.25    Years: 15.00    Pack years: 3.75    Types: Cigarettes    Last attempt  to quit: 12/09/2014    Years since quitting: 3.4  . Smokeless tobacco: Never Used  Substance Use Topics  . Alcohol use: Yes    Alcohol/week: 0.0 oz    Comment: occ  . Drug use: No     Allergies   Patient has no known allergies.   Review of Systems Review of Systems  Constitutional: Negative for chills and fever.  HENT: Negative for facial swelling and sore throat.   Eyes: Positive for photophobia. Negative for visual disturbance.  Respiratory: Negative for shortness of breath.   Cardiovascular: Negative for chest pain.  Gastrointestinal: Negative for abdominal pain, nausea and vomiting.  Genitourinary: Negative for dysuria.  Musculoskeletal: Negative for back pain.  Skin: Negative for rash and wound.  Neurological: Positive for headaches. Negative for numbness.  Psychiatric/Behavioral: The patient is not nervous/anxious.      Physical Exam Updated Vital Signs BP 132/86   Pulse (!) 52   Temp 98.3 F (36.8 C) (Oral)   Resp 18   Ht  (1.905 m)   Wt 77.1 kg (170 lb)   SpO2 98%   BMI 21.25 kg/m   Physical Exam  Constitutional: He appears well-developed and well-nourished. No distress.  HENT:  Head: Normocephalic and atraumatic.    Mouth/Throat: Oropharynx is clear and moist. No oropharyngeal exudate.  No swelling or color change over temporal artery  Eyes: Pupils are equal, round, and reactive to light. Conjunctivae and EOM are normal. Right eye exhibits no discharge. Left eye exhibits no discharge. No scleral icterus.  Neck: Normal range of motion. Neck supple. No thyromegaly present.  Cardiovascular: Normal rate, regular rhythm, normal heart sounds and intact distal pulses. Exam reveals no gallop and no friction rub.  No murmur heard. Pulmonary/Chest: Effort normal and breath sounds normal. No stridor. No respiratory distress. He has no wheezes. He has no rales.  Abdominal: Soft. Bowel sounds are normal. He exhibits no distension. There is no tenderness.  There is no rebound and no guarding.  Musculoskeletal: He exhibits no edema.  Lymphadenopathy:    He has no cervical adenopathy.  Neurological: He is alert. Coordination normal. GCS eye subscore is 4. GCS verbal subscore is 5. GCS motor subscore is 6.  CN 3-12 intact; normal sensation throughout; 5/5 strength in all 4 extremities; equal bilateral grip strength  Skin: Skin is warm and dry. No rash noted. He is not diaphoretic. No pallor.  Psychiatric: He has a normal mood and affect.  Nursing note and vitals reviewed.    ED Treatments / Results  Labs (all labs ordered are listed, but only abnormal results are displayed) Labs Reviewed - No data to display  EKG None  Radiology Ct Head Wo Contrast  Result Date: 05/07/2018 CLINICAL DATA:  Frontal headache. EXAM: CT HEAD WITHOUT CONTRAST TECHNIQUE: Contiguous axial  images were obtained from the base of the skull through the vertex without intravenous contrast. COMPARISON:  None. FINDINGS: Brain: No acute intracranial abnormality. Specifically, no hemorrhage, hydrocephalus, mass lesion, acute infarction, or significant intracranial injury. Vascular: No hyperdense vessel or unexpected calcification. Skull: No acute calvarial abnormality. Sinuses/Orbits: Visualized paranasal sinuses and mastoids clear. Orbital soft tissues unremarkable. Other: None IMPRESSION: Normal study. Electronically Signed   By: Charlett Nose M.D.   On: 05/07/2018 11:14    Procedures Procedures (including critical care time)  Medications Ordered in ED Medications  haloperidol lactate (HALDOL) injection 5 mg (0 mg Intravenous Hold 05/07/18 1444)  sodium chloride 0.9 % bolus 1,000 mL (0 mLs Intravenous Stopped 05/07/18 1017)  metoCLOPramide (REGLAN) injection 10 mg (10 mg Intravenous Given 05/07/18 0927)  dexamethasone (DECADRON) injection 4 mg (4 mg Intravenous Given 05/07/18 0927)  magnesium sulfate IVPB 1 g 100 mL (0 g Intravenous Stopped 05/07/18 1039)  ketorolac  (TORADOL) 30 MG/ML injection 30 mg (30 mg Intravenous Given 05/07/18 1148)  valproate (DEPACON) 500 mg in dextrose 5 % 50 mL IVPB (0 mg Intravenous Stopped 05/07/18 1249)     Initial Impression / Assessment and Plan / ED Course  I have reviewed the triage vital signs and the nursing notes.  Pertinent labs & imaging results that were available during my care of the patient were reviewed by me and considered in my medical decision making (see chart for details).     Patient presenting with a one-week history of headache.  Normal neuro exam without focal deficits.   CT head is negative.  After Reglan, Decadron, Toradol, magnesium sulfate, valproic acid, fluids, patient still did not improve.  I consulted neurologist and spoke with Dr. Wilford Corner who advised high flow oxygen.  This improved patient's headache and brought his pain score from a 10/10 to a 5/10.  He is feeling better and would like to go home.  Dr. Wilford Corner advised to follow-up for out patient work-up with neurology.  I will defer verapamil preventative treatment considering no official diagnosis of cluster headache and patient is currently on diltiazem and metoprolol for blood pressure control.  Strict return precautions given.  Patient understands and agrees with plan.  Patient vitals stable throughout ED course and discharged in satisfactory condition. I discussed patient case with Dr. Jeraldine Loots who guided the patient's management and agrees with plan.   Final Clinical Impressions(s) / ED Diagnoses   Final diagnoses:  Acute nonintractable headache, unspecified headache type    ED Discharge Orders    None       Emi Holes, PA-C 05/07/18 1653    Gerhard Munch, MD 05/09/18 [phone]

## 2018-05-07 NOTE — ED Triage Notes (Signed)
Reports watery eyes, runny nose and headache for a week. Seen at [facility] a few days ago.  Was told he is dehydrated.  Reports no relief in headache.  Took aleve at 11pm.

## 2018-05-07 NOTE — ED Notes (Signed)
Pt stated that the care team treated the pt very nicely. Pt stated that care team was very passionate and were great  listeners to the complaints he came in here with.

## 2018-05-08 ENCOUNTER — Encounter: Payer: Self-pay | Admitting: Pulmonary Disease

## 2018-05-08 ENCOUNTER — Ambulatory Visit (INDEPENDENT_AMBULATORY_CARE_PROVIDER_SITE_OTHER): Payer: 59 | Admitting: Pulmonary Disease

## 2018-05-08 DIAGNOSIS — J4531 Mild persistent asthma with (acute) exacerbation: Secondary | ICD-10-CM | POA: Diagnosis not present

## 2018-05-08 MED ORDER — ALBUTEROL SULFATE (2.5 MG/3ML) 0.083% IN NEBU: 2.5000 mg | INHALATION_SOLUTION | RESPIRATORY_TRACT | 5 refills | Status: DC | PRN

## 2018-05-08 MED ORDER — ALBUTEROL SULFATE HFA 108 (90 BASE) MCG/ACT IN AERS: 1.0000 | INHALATION_SPRAY | Freq: Four times a day (QID) | RESPIRATORY_TRACT | 5 refills | Status: DC | PRN

## 2018-05-08 MED ORDER — BUDESONIDE-FORMOTEROL FUMARATE 160-4.5 MCG/ACT IN AERO: 2.0000 | INHALATION_SPRAY | Freq: Two times a day (BID) | RESPIRATORY_TRACT | 11 refills | Status: DC

## 2018-05-08 NOTE — Progress Notes (Signed)
  ID: Austin Curry, male    DOB: [birth year], 37 y.o.   MRN: [id number]  Chief Complaint  Patient presents with  . Asthma    report he has been well    Referring provider: Gordy Savers, MD  HPI: 37 year old male patient being seen in the  pulmonary for asthma since 2017. Former smoker - quit 12/2017 - 3.75 pack years  Recent ER admissions:  04/19/2018-ER admission for atypical chest pain - neg troponins 05/02/2018 ER admission for headache 05/07/2018- acute non-intractable headache  01/20/2017-office visit-[facility] Doing well. Continue Symbicort continue using albuterol on an as-needed basis, continue Flonase  04/14/2017 Follow up : Donnald Garre /[place]  Doing well on Symbicort.  Continue asthma and allergy management.  Follow-up in 3 months.  Tests August 2017 - PFT-  lung function testing showed ratio 67%, FEV1 increased from 3.38 L to 3.97 L after bronchodilator which is a 17% change to 94% predicted, total lung capacity was incorrect, DLCO 41.4-109% predicted 02/27/2017-respiratory allergy profile was positive for ragweed, dog, cat, grasses and trees. IgE was high at 595.  05/07/2018-CT head without contrast- no acute intracranial abnormality.  Normal study 02/19/2018-chest x-ray- borderline h yperinflation consistent with known reactive airway disease.  No acute abnormalities.  05/08/18 - Office Visit  She reports today doing well.  Needing referral for inhalers.  Patient reports that he recently stopped smoking in January 2019.  Patient reports using rescue inhaler 1-2 times a week.   Patient reports that although asthma is doing well he has been dealing with cluster headaches and will be calling the neurologist today.  Patient was recently seen in the ER for this issue.   No Known Allergies  Immunization History  Administered Date(s) Administered  . Influenza,inj,Quad PF,6+ Mos 09/05/2016, 11/22/2017  . PPD Test 09/11/2013  . Tdap 06/25/2013    Past Medical  History:  Diagnosis Date  . Asthma   . Hernia     Tobacco History: Social History   Tobacco Use  Smoking Status Current Every Day Smoker  . Packs/day: 0.25  . Years: 15.00  . Pack years: 3.75  . Types: Cigarettes  . Last attempt to quit: 12/09/2014  . Years since quitting: 3.4  Smokeless Tobacco Never Used  Tobacco Comment   quit smoking in January   Ready to quit: Not Answered Counseling given: Not Answered Comment: quit smoking in January   Outpatient Encounter Medications as of 05/08/2018  Medication Sig  . albuterol (PROVENTIL HFA;VENTOLIN HFA) 108 (90 Base) MCG/ACT inhaler Inhale 1-2 puffs into the lungs every 6 (six) hours as needed for wheezing or shortness of breath.  [name] albuterol (PROVENTIL) (2.5 MG/3ML) 0.083% nebulizer solution Take 3 mLs (2.5 mg total) by nebulization every 4 (four) hours as needed for wheezing or shortness of breath.  . budesonide-formoterol (SYMBICORT) 160-4.5 MCG/ACT inhaler Inhale 2 puffs into the lungs 2 (two) times daily.  . cetirizine (ZYRTEC) 10 MG tablet Take 10 mg by mouth daily.  [name] diltiazem (CARDIZEM) 60 MG tablet Take 60 mg by mouth 2 (two) times daily.  . fluticasone (FLONASE) 50 MCG/ACT nasal spray Place 1 spray into both nostrils daily.  [name] lidocaine (LIDODERM) 5 % Place 1 patch onto the skin daily. Remove & Discard patch within 12 hours or as directed by MD  . methocarbamol (ROBAXIN) 500 MG tablet Take 2 tablets (1,000 mg total) by mouth every 8 (eight) hours as needed for muscle spasms.  [name] METOPROLOL SUCCINATE PO Take by mouth.  . pantoprazole (  PROTONIX) 40 MG tablet Take 1 tablet (40 mg total) by mouth daily.  . [DISCONTINUED] albuterol (PROVENTIL HFA;VENTOLIN HFA) 108 (90 Base) MCG/ACT inhaler Inhale 1-2 puffs into the lungs every 6 (six) hours as needed for wheezing or shortness of breath.  . [DISCONTINUED] albuterol (PROVENTIL) (2.5 MG/3ML) 0.083% nebulizer solution Take 3 mLs (2.5 mg total) by nebulization every 4 (four) hours as  needed for wheezing or shortness of breath.  . [DISCONTINUED] beclomethasone (QVAR) 80 MCG/ACT inhaler Inhale 2 puffs into the lungs 2 (two) times daily.  . [DISCONTINUED] budesonide-formoterol (SYMBICORT) 160-4.5 MCG/ACT inhaler Inhale 2 puffs into the lungs 2 (two) times daily.   No facility-administered encounter medications on file as of 05/08/2018.      Review of Systems  Constitutional:   No  weight loss, night sweats,  Fevers, chills, fatigue, or  lassitude.  HEENT:   No headaches,  Difficulty swallowing,  Tooth/dental problems, or  Sore throat, No sneezing, itching, ear ache, nasal congestion, post nasal drip,   CV:  No chest pain,  Orthopnea, PND, swelling in lower extremities, anasarca, dizziness, palpitations, syncope.   GI:  No heartburn, indigestion, abdominal pain, nausea, vomiting, diarrhea, change in bowel habits, loss of appetite, bloody stools.   Resp: No shortness of breath with exertion or at rest.  No excess mucus, no productive cough,  No non-productive cough,  No coughing up of blood.  No change in color of mucus.  No wheezing.  No chest wall deformity  Skin: no rash or lesions.  GU: no dysuria, change in color of urine, no urgency or frequency.  No flank pain, no hematuria   MS:  No joint pain or swelling.  No decreased range of motion.  No back pain.    Physical Exam  BP 110/72 (BP Location: Left Arm, Cuff Size: Normal)   Pulse 61   Ht  (1.905 m)   Wt 176 lb 9.6 oz (80.1 kg)   SpO2 97%   BMI 22.07 kg/m   GEN: A/Ox3; pleasant , NAD, well nourished    HEENT:  [place]/AT,  EACs-clear, TMs-WNL, left TM full without infection, NOSE-clear, THROAT-clear, no lesions, no postnasal drip or exudate noted.   NECK:  Supple w/ fair ROM; no lymphadenopathy.    RESP  Clear  P & A; w/o, wheezes/ rales/ or rhonchi. no accessory muscle use  CARD:  RRR, no m/r/g, no peripheral edema, pulses intact, no cyanosis or clubbing.  Musco: Warm bil, no deformities or joint  swelling noted.   Neuro: alert, no focal deficits noted.    Skin: Warm, no lesions or rashes    Lab Results:  CBC    Component Value Date/Time   WBC 4.7 02/19/2018 0932   RBC 5.14 02/19/2018 0932   HGB 15.3 02/19/2018 0932   HCT 46.2 02/19/2018 0932   PLT 188 02/19/2018 0932   MCV 89.9 02/19/2018 0932   MCH 29.8 02/19/2018 0932   MCHC 33.1 02/19/2018 0932   RDW 12.8 02/19/2018 0932   LYMPHSABS 1.6 02/27/2017 1430   MONOABS 0.9 02/27/2017 1430   EOSABS 0.3 02/27/2017 1430   BASOSABS 0.0 02/27/2017 1430    BMET    Component Value Date/Time   NA 138 02/19/2018 0932   K 4.6 02/19/2018 0932   CL 107 02/19/2018 0932   CO2 24 02/19/2018 0932   GLUCOSE 98 02/19/2018 0932   BUN 22 (H) 02/19/2018 0932   CREATININE 1.20 02/19/2018 0932   CALCIUM 9.4 02/19/2018 0932  GFRNONAA >60 02/19/2018 0932   GFRAA >60 02/19/2018 0932    BNP No results found for: BNP  ProBNP No results found for: PROBNP  Imaging: Ct Head Wo Contrast  Result Date: 05/07/2018 CLINICAL DATA:  Frontal headache. EXAM: CT HEAD WITHOUT CONTRAST TECHNIQUE: Contiguous axial images were obtained from the base of the skull through the vertex without intravenous contrast. COMPARISON:  None. FINDINGS: Brain: No acute intracranial abnormality. Specifically, no hemorrhage, hydrocephalus, mass lesion, acute infarction, or significant intracranial injury. Vascular: No hyperdense vessel or unexpected calcification. Skull: No acute calvarial abnormality. Sinuses/Orbits: Visualized paranasal sinuses and mastoids clear. Orbital soft tissues unremarkable. Other: None IMPRESSION: Normal study. Electronically Signed   By: Charlett Nose M.D.   On: 05/07/2018 11:14     Assessment & Plan:   Asthma, chronic Continue current regimen >>>Refilled Symbicort and albuterol, albuterol nebulizer Continue hard work with stopping smoking Be mindful of triggers and being around others who may smoke Be mindful of your rescue inhaler  use  >>>notify office if you are having to use more than usual Follow-up with Dr. Kendrick Fries in 6 months     Coral Ceo, NP 05/08/2018

## 2018-05-08 NOTE — Patient Instructions (Addendum)
Continue current regimen >>>Refilled Symbicort and albuterol, albuterol nebulizer Continue hard work with stopping smoking Be mindful of triggers and being around others who may smoke Be mindful of your rescue inhaler use  >>>notify office if you are having to use more than usual Follow-up with Dr. Kendrick Fries in 6 months   Please contact the office if your symptoms worsen or you have concerns that you are not improving.   Thank you for choosing [facility] for your healthcare, and for allowing [place] to partner with you on your healthcare journey. I am thankful to be able to provide care to you today.   Elisha Headland FNP-C

## 2018-05-08 NOTE — Assessment & Plan Note (Signed)
Continue current regimen >>>Refilled Symbicort and albuterol, albuterol nebulizer Continue hard work with stopping smoking Be mindful of triggers and being around others who may smoke Be mindful of your rescue inhaler use  >>>notify office if you are having to use more than usual Follow-up with Dr. Kendrick Fries in 6 months

## 2018-05-21 NOTE — Progress Notes (Signed)
Cardiology Office Note   Date:  05/22/2018   ID:  CORION SHERROD, DOB 11/24/1981, MRN [id number]  PCP:  Gordy Savers, MD  Cardiologist: [facility]  Chief Complaint  Patient presents with  . New Patient (Initial Visit)    pt mentions having chest pains, second opinion      History of Present Illness: Austin Curry is a 37 y.o. male who presents for cardiac evaluation of chest pressure and tachycardia at the request of Dr. Frederica Kuster. The patient has a history of hypertension and asthma. He was seen in the past by Dr. Algie Coffer for similar symptoms and had echocardiogram completed. Results are unknown. Patient reports that he was told that his heart was enlarged. He was started on metoprolol and diltiazem for BP and HR control.   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 Family history of HTN, Diabetes, CVA and Hypercholesterolemia. He does not ingest caffeine or nicoteine.     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  He feels dizzy with these symptoms resting helps.    Past Medical History:  Diagnosis Date  . Asthma   . Hernia   . Hypertension     Past Surgical History:  Procedure Laterality Date  . INSERTION OF MESH N/A 04/18/2016   Procedure: INSERTION OF MESH;  Surgeon: Abigail Miyamoto, MD;  Location: [facility];  Service: General;  Laterality: N/A;  . VENTRAL HERNIA REPAIR N/A 04/18/2016   Procedure:  VENTRAL HERNIA REPAIR;  Surgeon: Abigail Miyamoto, MD;  Location: [facility];  Service: General;  Laterality: N/A;     Current Outpatient Medications  Medication Sig Dispense Refill  . albuterol (PROVENTIL HFA;VENTOLIN HFA) 108 (90 Base) MCG/ACT inhaler Inhale 1-2 puffs into the lungs every 6 (six) hours as needed for wheezing or shortness of breath. 1 Inhaler 5  . albuterol (PROVENTIL) (2.5 MG/3ML) 0.083% nebulizer solution Take 3 mLs (2.5 mg total) by  nebulization every 4 (four) hours as needed for wheezing or shortness of breath. 30 vial 5  . budesonide-formoterol (SYMBICORT) 160-4.5 MCG/ACT inhaler Inhale 2 puffs into the lungs 2 (two) times daily. 1 Inhaler 11  . cetirizine (ZYRTEC) 10 MG tablet Take 10 mg by mouth daily.    [name] diltiazem (CARDIZEM) 60 MG tablet Take 60 mg by mouth 2 (two) times daily.  1  . fluticasone (FLONASE) 50 MCG/ACT nasal spray Place 1 spray into both nostrils daily. 16 g 2  . lidocaine (LIDODERM) 5 % Place 1 patch onto the skin daily. Remove & Discard patch within 12 hours or as directed by MD 10 patch 0  . methocarbamol (ROBAXIN) 500 MG tablet Take 2 tablets (1,000 mg total) by mouth every 8 (eight) hours as needed for muscle spasms. 30 tablet 0  . METOPROLOL SUCCINATE PO Take by mouth.    . pantoprazole (PROTONIX) 40 MG tablet Take 1 tablet (40 mg total) by mouth daily. 30 tablet 5   No current facility-administered medications for this visit.     Allergies:   Patient has no known allergies.    Social History:  The patient  reports that he quit smoking about 3 years ago. His smoking use included cigarettes. He has a 3.75 pack-year smoking history. He has never used smokeless tobacco. He reports that he drinks alcohol. He reports that he does not use drugs.   Family History:  The patient's family  history includes Asthma in his brother and brother; CVA in his mother; Diabetes in his mother; Hypercholesterolemia in his mother; Hyperlipidemia in his father; Hypertension in his mother.    ROS: All other systems are reviewed and negative. Unless otherwise mentioned in H&P    PHYSICAL EXAM: VS:  BP 134/82 (BP Location: Right Arm)   Pulse 65   Ht  (1.905 m)   Wt 171 lb 3.2 oz (77.7 kg)   BMI 21.40 kg/m  , BMI Body mass index is 21.4 kg/m. GEN: Well nourished, well developed, in no acute distress  HEENT: normal  Neck: no JVD, carotid bruits, or masses Cardiac: RRR; no murmurs, rubs, or gallops,no edema    Respiratory:  Clear to auscultation bilaterally, normal work of breathing GI: soft, nontender, nondistended, + BS MS: no deformity or atrophy  Skin: warm and dry, no rash Neuro:  Strength and sensation are intact Psych: euthymic mood, full affect   EKG:  NSR with sinus arrhythmia. Rate of 65 bpm.   Recent Labs: 02/19/2018: BUN 22; Creatinine, Ser 1.20; Hemoglobin 15.3; Platelets 188; Potassium 4.6; Sodium 138    Lipid Panel    Component Value Date/Time   CHOL 157 07/05/2016 0756   TRIG 90.0 07/05/2016 0756   HDL 46.10 07/05/2016 0756   CHOLHDL 3 07/05/2016 0756   VLDL 18.0 07/05/2016 0756   LDLCALC 93 07/05/2016 0756      Wt Readings from Last 3 Encounters:  05/22/18 171 lb 3.2 oz (77.7 kg)  05/08/18 176 lb 9.6 oz (80.1 kg)  05/07/18 170 lb (77.1 kg)      Other studies Reviewed: Requesting from Dr. Roseanne Kaufman office.   ASSESSMENT AND PLAN:  1. Chest Pressure: Multifactorial, may be associated with asthma with reactive airway disease diagnosed per pulmonologist, rapid HR, or elevated BP. EKG is negative for evidence of ischemia or prior infarct.  Will await records from Dr. Algie Coffer concerning prior testing. May consider POET if symptoms persist.   2. Tachycardia: Associated with lifting heavy packages and working in the heat. This may also be exacerbated by steroid inhalers in the setting of asthma. I will place a 2 week cardiac monitor to evaluate frequency, duration and morphology of self reported rapid HR. Check BMET and TSH.  A letter to   3. Asthma: Followed by pulmonologist Dr. Kendrick Fries. I have asked the patient to speak to pulmonologist concerning possible adjustment of medication that are less stimulating to HR.    Current medicines are reviewed at length with the patient today.  I have discussed this patient with Dr. Allyson Sabal who is DOD in the office today who has reviewed the chart and is in agreement with my assessment and plan.   Labs/ tests ordered today  include: 2 week cardiac monitor, TSH and BMET.   Bettey Mare. Liborio Nixon, ANP, AACC   05/22/2018 4:25 PM    [facility] [address] [place], [place] [address] Phone: [phone]; Fax: [phone]

## 2018-05-22 ENCOUNTER — Ambulatory Visit (INDEPENDENT_AMBULATORY_CARE_PROVIDER_SITE_OTHER): Payer: 59 | Admitting: Adult Health

## 2018-05-22 ENCOUNTER — Encounter: Payer: Self-pay | Admitting: Adult Health

## 2018-05-22 VITALS — BP 134/82 | HR 65 | Ht 75.0 in | Wt 171.2 lb

## 2018-05-22 DIAGNOSIS — I10 Essential (primary) hypertension: Secondary | ICD-10-CM

## 2018-05-22 DIAGNOSIS — Z79899 Other long term (current) drug therapy: Secondary | ICD-10-CM

## 2018-05-22 DIAGNOSIS — J45909 Unspecified asthma, uncomplicated: Secondary | ICD-10-CM | POA: Diagnosis not present

## 2018-05-22 DIAGNOSIS — R000 Tachycardia, unspecified: Secondary | ICD-10-CM | POA: Diagnosis not present

## 2018-05-22 NOTE — Patient Instructions (Signed)
Medication Instructions:  NO CHANGES- Your physician recommends that you continue on your current medications as directed. Please refer to the Current Medication list given to you today.  If you need a refill on your cardiac medications before your next appointment, please call your pharmacy.  Labwork: Bmet,mag and tsh today HERE IN OUR OFFICE AT [facility]  Take the provided lab slips with you to the lab for your blood draw.   Special Instructions: Your physician has recommended that you wear an event monitor-14 day. Event monitors are medical devices that record the heart's electrical activity. Doctors most often [place] these monitors to diagnose arrhythmias. Arrhythmias are problems with the speed or rhythm of the heartbeat. The monitor is a small, portable device. You can wear one while you do your normal daily activities. This is usually used to diagnose what is causing palpitations/syncope (passing out). This will be performed at our [facility] location - [address], [address].  PLEASE CALL DR MCQUAID AND DISCUSS YOU STEROID INHALERS.  Follow-Up: Your physician wants you to follow-up in: 1 MONTH WITH DR Allyson Sabal   Thank you for choosing [facility] at [facility]!!

## 2018-05-23 LAB — BASIC METABOLIC PANEL
BUN / CREAT RATIO: 17 (ref 9–20)
BUN: 17 mg/dL (ref 6–20)
CHLORIDE: 102 mmol/L (ref 96–106)
CO2: 25 mmol/L (ref 20–29)
Calcium: 10 mg/dL (ref 8.7–10.2)
Creatinine, Ser: 0.98 mg/dL (ref 0.76–1.27)
GFR calc non Af Amer: 99 mL/min/{1.73_m2} (ref >59)
GFR, EST AFRICAN AMERICAN: 114 mL/min/{1.73_m2} (ref >59)
Glucose: 59 mg/dL — ABNORMAL LOW (ref 65–99)
Potassium: 4.5 mmol/L (ref 3.5–5.2)
SODIUM: 143 mmol/L (ref 134–144)

## 2018-05-23 LAB — TSH: TSH: 0.351 u[IU]/mL — ABNORMAL LOW (ref 0.450–4.500)

## 2018-05-23 LAB — MAGNESIUM: Magnesium: 2 mg/dL (ref 1.6–2.3)

## 2018-05-23 NOTE — Progress Notes (Signed)
Noted! I called pt and notified him of his results and scheduled him an appointment for 05/30/2018. Pt has to have work approval to take off. Please advise if he needs to be seen sooner.

## 2018-05-24 NOTE — Progress Notes (Signed)
Pt isnt sure if he has time to stop by the lab to get blood work. He stated that he still needed to see Dr.Kwiatkowski so he wants to do it on the day he comes. He stated if he really had to he can see what he can do. I informed pt that I will talk to [facility] regarding the information.

## 2018-05-30 ENCOUNTER — Encounter: Payer: Self-pay | Admitting: Internal Medicine

## 2018-05-30 ENCOUNTER — Ambulatory Visit: Payer: 59 | Admitting: Internal Medicine

## 2018-05-30 ENCOUNTER — Ambulatory Visit (INDEPENDENT_AMBULATORY_CARE_PROVIDER_SITE_OTHER): Payer: 59

## 2018-05-30 VITALS — BP 90/60 | HR 67 | Temp 98.9°F | Wt 173.0 lb

## 2018-05-30 DIAGNOSIS — R000 Tachycardia, unspecified: Secondary | ICD-10-CM

## 2018-05-30 DIAGNOSIS — R7989 Other specified abnormal findings of blood chemistry: Secondary | ICD-10-CM | POA: Diagnosis not present

## 2018-05-30 DIAGNOSIS — R002 Palpitations: Secondary | ICD-10-CM

## 2018-05-30 HISTORY — DX: Palpitations: R00.2

## 2018-05-30 LAB — T3, FREE: T3, Free: 3.4 pg/mL (ref 2.3–4.2)

## 2018-05-30 LAB — T4, FREE: Free T4: 0.94 ng/dL (ref 0.60–1.60)

## 2018-05-30 NOTE — Patient Instructions (Addendum)
Limit your sodium (Salt) intake    It is important that you exercise regularly, at least 20 minutes 3 to 4 times per week.  If you develop chest pain or shortness of breath seek  medical attention.  Pulmonary follow-up as scheduled  Signs and symptoms of hyperthyroidism may include:  Nervousness.  Inability to tolerate heat.  Unexplained weight loss.  Diarrhea.  Change in the texture of hair or skin.  Heart skipping beats or making extra beats.  Rapid heart rate.  Loss of menstruation.  Shaky hands.  Fatigue.  Restlessness.  Increased appetite.  Sleep problems.  Enlarged thyroid gland or nodules.

## 2018-05-30 NOTE — Progress Notes (Signed)
Subjective:    Patient ID: Austin Curry, male    DOB: July 11, 1981, 37 y.o.   MRN: [id number]  HPI  37 year old patient who has a history of chronic asthma followed by allergy medicine.  He has also been seen by cardiology due to palpitations.  He is scheduled for an event monitor.  He has had a 2D echocardiogram in the past and was told that he had some thickening of the heart muscle. Laboratory studies have revealed a slightly depressed TSH.  His asthma has been stable.   palpitations are described as a abrupt onset of a rapid regular rhythm with quick termination  Past Medical History:  Diagnosis Date  . Asthma   . Hernia   . Hypertension      Social History   Socioeconomic History  . Marital status: Single    Spouse name: Not on file  . Number of children: Not on file  . Years of education: Not on file  . Highest education level: Not on file  Occupational History  . Occupation: Industrial/product designer: [employer]  Social Needs  . Financial resource strain: Not on file  . Food insecurity:    Worry: Not on file    Inability: Not on file  . Transportation needs:    Medical: Not on file    Non-medical: Not on file  Tobacco Use  . Smoking status: Former Smoker    Packs/day: 0.25    Years: 15.00    Pack years: 3.75    Types: Cigarettes    Last attempt to quit: 12/09/2014    Years since quitting: 3.4  . Smokeless tobacco: Never Used  . Tobacco comment: quit smoking in January  Substance and Sexual Activity  . Alcohol use: Yes    Alcohol/week: 0.0 oz    Comment: occ  . Drug use: No  . Sexual activity: Not on file  Lifestyle  . Physical activity:    Days per week: Not on file    Minutes per session: Not on file  . Stress: Not on file  Relationships  . Social connections:    Talks on phone: Not on file    Gets together: Not on file    Attends religious service: Not on file    Active member of club or organization: Not on file    Attends meetings of clubs or  organizations: Not on file    Relationship status: Not on file  . Intimate partner violence:    Fear of current or ex partner: Not on file    Emotionally abused: Not on file    Physically abused: Not on file    Forced sexual activity: Not on file  Other Topics Concern  . Not on file  Social History Narrative  . Not on file    Past Surgical History:  Procedure Laterality Date  . INSERTION OF MESH N/A 04/18/2016   Procedure: INSERTION OF MESH;  Surgeon: Abigail Miyamoto, MD;  Location: [facility];  Service: General;  Laterality: N/A;  . VENTRAL HERNIA REPAIR N/A 04/18/2016   Procedure:  VENTRAL HERNIA REPAIR;  Surgeon: Abigail Miyamoto, MD;  Location: [facility];  Service: General;  Laterality: N/A;    Family History  Problem Relation Age of Onset  . Diabetes Mother   . Hypertension Mother   . CVA Mother   . Hypercholesterolemia Mother   . Hyperlipidemia Father   . Asthma Brother   . Asthma Brother  No Known Allergies  Current Outpatient Medications on File Prior to Visit  Medication Sig Dispense Refill  . albuterol (PROVENTIL HFA;VENTOLIN HFA) 108 (90 Base) MCG/ACT inhaler Inhale 1-2 puffs into the lungs every 6 (six) hours as needed for wheezing or shortness of breath. 1 Inhaler 5  . albuterol (PROVENTIL) (2.5 MG/3ML) 0.083% nebulizer solution Take 3 mLs (2.5 mg total) by nebulization every 4 (four) hours as needed for wheezing or shortness of breath. 30 vial 5  . budesonide-formoterol (SYMBICORT) 160-4.5 MCG/ACT inhaler Inhale 2 puffs into the lungs 2 (two) times daily. 1 Inhaler 11  . cetirizine (ZYRTEC) 10 MG tablet Take 10 mg by mouth daily.    [name] diltiazem (CARDIZEM) 60 MG tablet Take 60 mg by mouth 2 (two) times daily.  1  . fluticasone (FLONASE) 50 MCG/ACT nasal spray Place 1 spray into both nostrils daily. 16 g 2  . lidocaine (LIDODERM) 5 % Place 1 patch onto the skin daily. Remove & Discard patch within 12 hours or as directed by MD 10 patch 0  . methocarbamol (ROBAXIN)  500 MG tablet Take 2 tablets (1,000 mg total) by mouth every 8 (eight) hours as needed for muscle spasms. 30 tablet 0  . METOPROLOL SUCCINATE PO Take by mouth.    . pantoprazole (PROTONIX) 40 MG tablet Take 1 tablet (40 mg total) by mouth daily. 30 tablet 5   No current facility-administered medications on file prior to visit.     BP 90/60 (BP Location: Right Arm, Patient Position: Sitting, Cuff Size: Large)   Pulse 67   Temp 98.9 F (37.2 C) (Oral)   Wt 173 lb (78.5 kg)   SpO2 97%   BMI 21.62 kg/m     Review of Systems  Constitutional: Negative for appetite change, chills, fatigue and fever.  HENT: Negative for congestion, dental problem, ear pain, hearing loss, sore throat, tinnitus, trouble swallowing and voice change.   Eyes: Negative for pain, discharge and visual disturbance.  Respiratory: Negative for cough, chest tightness, wheezing and stridor.   Cardiovascular: Positive for palpitations. Negative for chest pain and leg swelling.  Gastrointestinal: Negative for abdominal distention, abdominal pain, blood in stool, constipation, diarrhea, nausea and vomiting.  Genitourinary: Negative for difficulty urinating, discharge, flank pain, genital sores, hematuria and urgency.  Musculoskeletal: Negative for arthralgias, back pain, gait problem, joint swelling, myalgias and neck stiffness.  Skin: Negative for rash.  Neurological: Negative for dizziness, syncope, speech difficulty, weakness, numbness and headaches.  Hematological: Negative for adenopathy. Does not bruise/bleed easily.  Psychiatric/Behavioral: Negative for behavioral problems and dysphoric mood. The patient is not nervous/anxious.        Objective:   Physical Exam  Constitutional: He is oriented to person, place, and time. He appears well-developed.  HENT:  Head: Normocephalic.  Right Ear: External ear normal.  Left Ear: External ear normal.  Eyes: Conjunctivae and EOM are normal.  Neck: Normal range of motion.  No thyromegaly present.  Cardiovascular: Normal rate and normal heart sounds.  Pulmonary/Chest: Breath sounds normal.  Abdominal: Bowel sounds are normal.  Musculoskeletal: Normal range of motion. He exhibits no edema or tenderness.  Neurological: He is alert and oriented to person, place, and time.  Reflexes brisk   Psychiatric: He has a normal mood and affect. His behavior is normal.          Assessment & Plan:   History of palpitations.  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  Clinically euthyroid.  Will check free T3 and free T4.  Continue beta-blocker therapy per cardiology  Follow-up for annual exam as scheduled Cardiology follow-up Scheduled for event monitor  [facility] Va Medical CenterKWIATKOWSKI,Teresina Bugaj FRANK

## 2018-06-22 ENCOUNTER — Ambulatory Visit: Payer: 59 | Admitting: Cardiovascular Disease

## 2018-06-22 ENCOUNTER — Encounter: Payer: Self-pay | Admitting: Cardiovascular Disease

## 2018-06-22 DIAGNOSIS — I10 Essential (primary) hypertension: Secondary | ICD-10-CM | POA: Diagnosis not present

## 2018-06-22 DIAGNOSIS — R002 Palpitations: Secondary | ICD-10-CM | POA: Diagnosis not present

## 2018-06-22 HISTORY — DX: Essential (primary) hypertension: I10

## 2018-06-22 MED ORDER — METOPROLOL SUCCINATE ER 25 MG PO TB24: 25.0000 mg | ORAL_TABLET | Freq: Every day | ORAL | 3 refills | Status: DC

## 2018-06-22 NOTE — Progress Notes (Signed)
06/22/2018 Austin Kirschnererek J Barcia   1981/01/01  [id number]  Primary Physician Gordy SaversKwiatkowski, Peter F, MD Primary Cardiologist: Austin GessJonathan J Berry MD Nicholes CalamityFACP, FACC, FAHA, [place]  HPI:  Austin KirschnerDerek J Vanalstyne is a 37 y.o. 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 Shawnie PonsKotowski for palpitations.  He saw Austin ReiningKathryn Curry nurse practitioner approxi-1 month ago.  He otherwise has no cardiac risk factors.Austin Curry.  He is never had a heart attack or stroke.  Recent event monitor showed only sinus rhythm/sinus tachycardia.   Current Meds  Medication Sig  . albuterol (PROVENTIL HFA;VENTOLIN HFA) 108 (90 Base) MCG/ACT inhaler Inhale 1-2 puffs into the lungs every 6 (six) hours as needed for wheezing or shortness of breath.  Austin Curry. albuterol (PROVENTIL) (2.5 MG/3ML) 0.083% nebulizer solution Take 3 mLs (2.5 mg total) by nebulization every 4 (four) hours as needed for wheezing or shortness of breath.  . budesonide-formoterol (SYMBICORT) 160-4.5 MCG/ACT inhaler Inhale 2 puffs into the lungs 2 (two) times daily.  . cetirizine (ZYRTEC) 10 MG tablet Take 10 mg by mouth daily.  Austin Curry. diltiazem (CARDIZEM) 60 MG tablet Take 60 mg by mouth 2 (two) times daily.  . fluticasone (FLONASE) 50 MCG/ACT nasal spray Place 1 spray into both nostrils daily.  Austin Curry. lidocaine (LIDODERM) 5 % Place 1 patch onto the skin daily. Remove & Discard patch within 12 hours or as directed by MD  . methocarbamol (ROBAXIN) 500 MG tablet Take 2 tablets (1,000 mg total) by mouth every 8 (eight) hours as needed for muscle spasms.  Austin Curry. METOPROLOL SUCCINATE PO Take by mouth.  . pantoprazole (PROTONIX) 40 MG tablet Take 1 tablet (40 mg total) by mouth daily.     No Known Allergies  Social History   Socioeconomic History  . Marital status: Single    Spouse name: Not on file  . Number of children: Not on file  . Years of education: Not on file  . Highest education level: Not on file  Occupational History  .  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 Financial resource strain: Not on file  . Food insecurity:    Worry: Not on file    Inability: Not on file  . Transportation needs:    Medical: Not on file    Non-medical: Not on file  Tobacco Use  . Smoking status: Former Smoker    Packs/day: 0.25    Years: 15.00    Pack years: 3.75    Types: Cigarettes    Last attempt to quit: 12/09/2014    Years since quitting: 3.5  . Smokeless tobacco: Never Used  . Tobacco comment: quit smoking in January  Substance and Sexual Activity  . Alcohol use: Yes    Alcohol/week: 0.0 oz    Comment: occ  . Drug use: No  . Sexual activity: Not on file  Lifestyle  . Physical activity:    Days per week: Not on file    Minutes per session: Not on file  . Stress: Not on file  Relationships  . Social connections:    Talks on phone: Not on file    Gets together: Not on file    Attends religious service: Not on file    Active member of club or organization: Not on file    Attends meetings of clubs or organizations: Not on file    Relationship status: Not on file  . Intimate partner violence:  Fear of current or ex partner: Not on file    Emotionally abused: Not on file    Physically abused: Not on file    Forced sexual activity: Not on file  Other Topics Concern  . Not on file  Social History Narrative  . Not on file     Review of Systems: General: negative for chills, fever, night sweats or weight changes.  Cardiovascular: negative for chest pain, dyspnea on exertion, edema, orthopnea, palpitations, paroxysmal nocturnal dyspnea or shortness of breath Dermatological: negative for rash Respiratory: negative for cough or wheezing Urologic: negative for hematuria Abdominal: negative for nausea, vomiting, diarrhea, bright red blood per rectum, melena, or hematemesis Neurologic: negative for visual changes, syncope, or dizziness All other systems reviewed and are otherwise  negative except as noted above.    Blood pressure 126/60, pulse 68, height 6\' 3"  (1.905 m), weight 175 lb (79.4 kg).  General appearance: alert and no distress Neck: no adenopathy, no carotid bruit, no JVD, supple, symmetrical, trachea midline and thyroid not enlarged, symmetric, no tenderness/mass/nodules Lungs: clear to auscultation bilaterally Heart: regular rate and rhythm, S1, S2 normal, no murmur, click, rub or gallop Extremities: extremities normal, atraumatic, no cyanosis or edema Pulses: 2+ and symmetric Skin: Skin color, texture, turgor normal. No rashes or lesions Neurologic: Alert and oriented X 3, normal strength and tone. Normal symmetric reflexes. Normal coordination and gait  EKG not performed today  ASSESSMENT AND PLAN:   Palpitations History of palpitations principally when he is lifting heavy objects with recent event monitor that showed sinus rhythm/sinus tachycardia without arrhythmias.  He was placed on a beta-blocker although I am not sure he took this.  He is on diltiazem for hypertension.  We will renew his prescription for Toprol.  He will see Metta Clines back in 6 months and me back in a year.  Essential hypertension Essential hypertension her blood pressure measured today at 126/60.  He is on Cardizem.  We will add back low-dose beta-blocker for his palpitations as well.      Austin Gess MD FACP,FACC,FAHA, [facility] 06/22/2018 3:04 PM

## 2018-06-22 NOTE — Assessment & Plan Note (Signed)
History of palpitations principally when he is lifting heavy objects with recent event monitor that showed sinus rhythm/sinus tachycardia without arrhythmias.  He was placed on a beta-blocker although I am not sure he took this.  He is on diltiazem for hypertension.  We will renew his prescription for Toprol.  He will see Metta ClinesCatherine Lawrence back in 6 months and me back in a year.

## 2018-06-22 NOTE — Assessment & Plan Note (Signed)
Essential hypertension her blood pressure measured today at 126/60.  He is on Cardizem.  We will add back low-dose beta-blocker for his palpitations as well.

## 2018-06-22 NOTE — Patient Instructions (Signed)
Your physician recommends that you schedule a follow-up appointment in: 6 MONTHS WITH Gladis RiffleKATHERYN LAWRENCE DNP  Your physician wants you to follow-up in: ONE YEAR WITH DR San MorelleBERRY You will receive a reminder letter in the mail two months in advance. If you don't receive a letter, please call our office to schedule the follow-up appointment.    If you need a refill on your cardiac medications before your next appointment, please call your pharmacy.

## 2018-07-18 ENCOUNTER — Ambulatory Visit (INDEPENDENT_AMBULATORY_CARE_PROVIDER_SITE_OTHER): Payer: 59 | Admitting: Internal Medicine

## 2018-07-18 ENCOUNTER — Encounter: Payer: Self-pay | Admitting: Internal Medicine

## 2018-07-18 VITALS — BP 118/62 | HR 78 | Temp 98.3°F | Ht 74.25 in | Wt 171.6 lb

## 2018-07-18 DIAGNOSIS — Z0000 Encounter for general adult medical examination without abnormal findings: Secondary | ICD-10-CM | POA: Diagnosis not present

## 2018-07-18 NOTE — Progress Notes (Signed)
   Subjective:    Patient ID: Austin Curry, male    DOB: 05-25-1981, 37 y.o.   MRN: [id number]  HPI  37 year old patient who is seen today for an annual exam  He has been followed by cardiology with palpitations.  He recently had a 2-week event monitor study.  He has a history of a slightly suppressed TSH but normal free T3 and normal free T4 he has history of chronic asthma and hypertension which have been stable  He was hospitalized in 2017 for an asthma exacerbation and also had ventral hernia repair that year No concerns or complaints  Family history mother age 36 with history of diabetes and hypertension Father age 36 in apparent good health 2 brothers.  One with bipolar disorder and schizophrenia Another brother with type 2 diabetes  Social history.  Employed by the [employer].  Now a supervisor   Review of Systems  Constitutional: Negative for appetite change, chills, fatigue and fever.  HENT: Negative for congestion, dental problem, ear pain, hearing loss, sore throat, tinnitus, trouble swallowing and voice change.   Eyes: Negative for pain, discharge and visual disturbance.  Respiratory: Positive for shortness of breath. Negative for cough, chest tightness, wheezing and stridor.   Cardiovascular: Positive for palpitations. Negative for chest pain and leg swelling.  Gastrointestinal: Negative for abdominal distention, abdominal pain, blood in stool, constipation, diarrhea, nausea and vomiting.  Genitourinary: Negative for difficulty urinating, discharge, flank pain, genital sores, hematuria and urgency.  Musculoskeletal: Negative for arthralgias, back pain, gait problem, joint swelling, myalgias and neck stiffness.  Skin: Negative for rash.  Neurological: Negative for dizziness, syncope, speech difficulty, weakness, numbness and headaches.  Hematological: Negative for adenopathy. Does not bruise/bleed easily.  Psychiatric/Behavioral: Negative for behavioral problems  and dysphoric mood. The patient is not nervous/anxious.        Objective:   Physical Exam  Constitutional: He appears well-developed and well-nourished.  HENT:  Head: Normocephalic and atraumatic.  Right Ear: External ear normal.  Left Ear: External ear normal.  Nose: Nose normal.  Mouth/Throat: Oropharynx is clear and moist.  Eyes: Pupils are equal, round, and reactive to light. Conjunctivae and EOM are normal. No scleral icterus.  Neck: Normal range of motion. Neck supple. No JVD present. No thyromegaly present.  Cardiovascular: Regular rhythm, normal heart sounds and intact distal pulses. Exam reveals no gallop and no friction rub.  No murmur heard. Pulmonary/Chest: Effort normal and breath sounds normal. He has no wheezes. He exhibits no tenderness.  Abdominal: Soft. Bowel sounds are normal. He exhibits no distension and no mass. There is no tenderness.  Genitourinary: Penis normal.  Genitourinary Comments: No hernia  Musculoskeletal: Normal range of motion. He exhibits no edema or tenderness.  Lymphadenopathy:    He has no cervical adenopathy.  Neurological: He is alert. He has normal reflexes. No cranial nerve deficit. Coordination normal.  Skin: Skin is warm and dry. No rash noted.  Psychiatric: He has a normal mood and affect. His behavior is normal.          Assessment & Plan:  Preventive health examination  Palpitations.  Presently stable Essential hypertension well-controlled Chronic asthma stable  We will challenge the patient off Protonix.  Otherwise medical regimen unchanged Patient is on low-dose beta-blocker therapy due to palpitations.  This has not exacerbated asthma no change in therapy at this time but will consider discontinuation the future if remains stable  Gordy SaversPeter F Bryli Mantey

## 2018-07-18 NOTE — Patient Instructions (Signed)
Avoids foods high in acid such as tomatoes citrus juices, and spicy foods.  Avoid eating within two hours of lying down or before exercising.  Do not overheat.  Try smaller more frequent meals.   If symptoms persist, okay to resume Protonix  Discontinue Protonix  Limit your sodium (Salt) intake  Please check your blood pressure on a regular basis.  If it is consistently greater than 140/90, please make an office appointment.  Return in 6 months for follow-up

## 2019-01-17 ENCOUNTER — Encounter: Payer: Self-pay | Admitting: Adult Health

## 2019-01-17 ENCOUNTER — Ambulatory Visit: Payer: 59 | Admitting: Adult Health

## 2019-01-17 VITALS — BP 124/84 | HR 60 | Temp 98.3°F | Ht 74.25 in | Wt 182.0 lb

## 2019-01-17 DIAGNOSIS — J4531 Mild persistent asthma with (acute) exacerbation: Secondary | ICD-10-CM | POA: Diagnosis not present

## 2019-01-17 DIAGNOSIS — Z23 Encounter for immunization: Secondary | ICD-10-CM | POA: Diagnosis not present

## 2019-01-17 DIAGNOSIS — I10 Essential (primary) hypertension: Secondary | ICD-10-CM | POA: Diagnosis not present

## 2019-01-17 DIAGNOSIS — R002 Palpitations: Secondary | ICD-10-CM

## 2019-01-17 DIAGNOSIS — Z7689 Persons encountering health services in other specified circumstances: Secondary | ICD-10-CM

## 2019-01-17 NOTE — Progress Notes (Signed)
Patient presents to clinic today to establish care. He is a pleasant 38 year old male who  has a past medical history of Asthma, Hernia, and Hypertension.  He is a former patient of Dr. Kirtland BouchardK   His last CPE was 06/2018   Acute Concerns: Establish Care   Chronic Issues: Essential Hypertension - is prescribed Cardizem 60 mg. He reports not taking this for the last two months.   Palpitations - has been seen by Cardiology, most recently in June 2019. Was placed on low dose BB. Has had event monitor in the past which showed NSR/Sinus Tachycardia. He is taking this infrequently.   Asthma - Sees pulmonary. Feels well controlled.   Health Maintenance: Dental -- Does not do routine care Vision -- Immunizations -- flu shot  Colonoscopy -- Never had    Past Medical History:  Diagnosis Date  . Asthma   . Hernia   . Hypertension     Past Surgical History:  Procedure Laterality Date  . INSERTION OF MESH N/A 04/18/2016   Procedure: INSERTION OF MESH;  Surgeon: Abigail Miyamotoouglas Blackman, MD;  Location: [facility];  Service: General;  Laterality: N/A;  . VENTRAL HERNIA REPAIR N/A 04/18/2016   Procedure:  VENTRAL HERNIA REPAIR;  Surgeon: Abigail Miyamotoouglas Blackman, MD;  Location: [facility];  Service: General;  Laterality: N/A;    Current Outpatient Medications on File Prior to Visit  Medication Sig Dispense Refill  . albuterol (PROVENTIL HFA;VENTOLIN HFA) 108 (90 Base) MCG/ACT inhaler Inhale 1-2 puffs into the lungs every 6 (six) hours as needed for wheezing or shortness of breath. 1 Inhaler 5  . albuterol (PROVENTIL) (2.5 MG/3ML) 0.083% nebulizer solution Take 3 mLs (2.5 mg total) by nebulization every 4 (four) hours as needed for wheezing or shortness of breath. 30 vial 5  . budesonide-formoterol (SYMBICORT) 160-4.5 MCG/ACT inhaler Inhale 2 puffs into the lungs 2 (two) times daily. 1 Inhaler 11  . cetirizine (ZYRTEC) 10 MG tablet Take 10 mg by mouth daily.    [name]. diltiazem (CARDIZEM) 60 MG tablet Take 60 mg by mouth  2 (two) times daily.  1  . fluticasone (FLONASE) 50 MCG/ACT nasal spray Place 1 spray into both nostrils daily. 16 g 2  . metoprolol succinate (TOPROL XL) 25 MG 24 hr tablet Take 1 tablet (25 mg total) by mouth daily. 90 tablet 3   No current facility-administered medications on file prior to visit.     No Known Allergies  Family History  Problem Relation Age of Onset  . Diabetes Mother   . Hypertension Mother   . CVA Mother   . Hypercholesterolemia Mother   . Hyperlipidemia Father   . Asthma Brother   . Asthma Brother     Social History   Socioeconomic History  . Marital status: Single    Spouse name: Not on file  . Number of children: Not on file  . Years of education: Not on file  . Highest education level: Not on file  Occupational History  . 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 Financial resource strain: Not on file  . Food insecurity:    Worry: Not on file    Inability: Not on file  . Transportation needs:    Medical: Not on file    Non-medical: Not on file  Tobacco Use  . Smoking status: Former Smoker    Packs/day: 0.25    Years: 15.00    Pack years: 3.75  Types: Cigarettes    Last attempt to quit: 12/09/2014    Years since quitting: 4.1  . Smokeless tobacco: Never Used  . Tobacco comment: quit smoking in January  Substance and Sexual Activity  . Alcohol use: Yes    Alcohol/week: 0.0 standard drinks    Comment: occ  . Drug use: No  . Sexual activity: Not on file  Lifestyle  . Physical activity:    Days per week: Not on file    Minutes per session: Not on file  . Stress: Not on file  Relationships  . Social connections:    Talks on phone: Not on file    Gets together: Not on file    Attends religious service: Not on file    Active member of club or organization: Not on file    Attends meetings of clubs or organizations: Not on file    Relationship status: Not on file  . Intimate partner violence:    Fear of current  or ex partner: Not on file    Emotionally abused: Not on file    Physically abused: Not on file    Forced sexual activity: Not on file  Other Topics Concern  . Not on file  Social History Narrative  . Not on file    Review of Systems  Constitutional: Negative.   HENT: Negative.   Eyes: Negative.   Respiratory: Negative.   Cardiovascular: Negative.   Gastrointestinal: Negative.   Genitourinary: Negative.   Musculoskeletal: Negative.   Skin: Negative.   Neurological: Negative.   Psychiatric/Behavioral: Negative.   All other systems reviewed and are negative.      BP 124/84 (BP Location: Left Arm, Patient Position: Sitting, Cuff Size: Normal)   Pulse 60   Temp 98.3 F (36.8 C) (Oral)   Ht 6' 2.25" (1.886 m)   Wt 182 lb (82.6 kg)   SpO2 98%   BMI 23.21 kg/m   Physical Exam Vitals signs and nursing note reviewed.  Constitutional:      General: He is not in acute distress.    Appearance: He is well-developed. He is not diaphoretic.  HENT:     Head: Normocephalic and atraumatic.     Mouth/Throat:     Pharynx: No oropharyngeal exudate.  Eyes:     General:        Right eye: No discharge.        Left eye: No discharge.     Conjunctiva/sclera: Conjunctivae normal.     Pupils: Pupils are equal, round, and reactive to light.  Neck:     Thyroid: No thyromegaly.     Trachea: No tracheal deviation.  Cardiovascular:     Rate and Rhythm: Normal rate and regular rhythm.     Heart sounds: Normal heart sounds. No murmur. No friction rub. No gallop.   Pulmonary:     Effort: Pulmonary effort is normal. No respiratory distress.     Breath sounds: Normal breath sounds. No stridor. No wheezing, rhonchi or rales.  Chest:     Chest wall: No tenderness.  Abdominal:     General: Bowel sounds are normal. There is no distension.     Palpations: Abdomen is soft. There is no mass.     Tenderness: There is no abdominal tenderness. There is no right CVA tenderness, left CVA tenderness,  guarding or rebound.     Hernia: No hernia is present.  Musculoskeletal: Normal range of motion.        General: No swelling,  tenderness, deformity or signs of injury.     Right lower leg: No edema.     Left lower leg: No edema.  Lymphadenopathy:     Cervical: No cervical adenopathy.  Skin:    General: Skin is warm and dry.     Coloration: Skin is not jaundiced or pale.     Findings: No bruising, erythema, lesion or rash.  Neurological:     Mental Status: He is alert and oriented to person, place, and time.     Cranial Nerves: No cranial nerve deficit.     Coordination: Coordination normal.  Psychiatric:        Mood and Affect: Mood normal.        Behavior: Behavior normal.        Thought Content: Thought content normal.        Judgment: Judgment normal.     Assessment/Plan:  1. Encounter to establish care - Follow up in July for CPE or sooner if needed - Needs to work on diet and exercise  - Follow up in 2-3 days   2. Essential hypertension - Well controlled. Will d/c Cardizem and have him take Toprol    3. Mild persistent chronic asthma with acute exacerbation - Continue with pulmonary recommendations   4. Palpitations - Take Toprol as directed  Shirline Freesory Caileb Rhue, NP

## 2019-03-19 ENCOUNTER — Ambulatory Visit: Payer: Self-pay

## 2019-03-19 NOTE — Telephone Encounter (Signed)
Patient called and says he has questions about him having asthma, his family is concerned about him being out in the public with the coronavirus. 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 I asked about his symptoms, exposure, travel, he denies all. He says he has a runny nose, but it's his allergies. I advised care advice for home care, he verbalized understanding.  Reason for Disposition . [1] No COVID-19 EXPOSURE BUT [2] questions about  Protocols used: CORONAVIRUS (COVID-19) EXPOSURE-A-AH

## 2019-03-29 ENCOUNTER — Telehealth: Payer: Self-pay | Admitting: Cardiovascular Disease

## 2019-03-29 NOTE — Telephone Encounter (Signed)
Left message to call back  

## 2019-03-29 NOTE — Telephone Encounter (Signed)
Spoke with pt who state he has asthma and inquiring as to whether he need to continue working or not. Pt instructed to contact pcp. Pt voiced understanding.

## 2019-03-29 NOTE — Telephone Encounter (Signed)
Follow up:   Patient returning your call. pleasa call patient.

## 2019-03-29 NOTE — Telephone Encounter (Signed)
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  He wants some advice on whether or not he should still be working.

## 2019-03-31 ENCOUNTER — Telehealth: Payer: 59 | Admitting: Family

## 2019-03-31 DIAGNOSIS — J4541 Moderate persistent asthma with (acute) exacerbation: Secondary | ICD-10-CM | POA: Diagnosis not present

## 2019-03-31 MED ORDER — PREDNISONE 20 MG PO TABS: 40.0000 mg | ORAL_TABLET | Freq: Every day | ORAL | 0 refills | Status: AC

## 2019-03-31 MED ORDER — ALBUTEROL SULFATE HFA 108 (90 BASE) MCG/ACT IN AERS: 2.0000 | INHALATION_SPRAY | Freq: Four times a day (QID) | RESPIRATORY_TRACT | 0 refills | Status: DC | PRN

## 2019-03-31 MED ORDER — BUDESONIDE-FORMOTEROL FUMARATE 160-4.5 MCG/ACT IN AERO: 2.0000 | INHALATION_SPRAY | Freq: Two times a day (BID) | RESPIRATORY_TRACT | 11 refills | Status: DC

## 2019-03-31 NOTE — Progress Notes (Signed)
E Visit for Asthma  Based on what you have shared with me, it looks like you may have a flare up of your asthma.  Asthma is a chronic (ongoing) lung disease which results in airway obstruction, inflammation and hyper-responsiveness.   Asthma symptoms vary from person to person, with common symptoms including nighttime awakening and decreased ability to participate in normal activities as a result of shortness of breath. It is often triggered by changes in weather, changes in the season, changes in air temperature, or inside (home, school, daycare or work) allergens such as animal dander, mold, mildew, woodstoves or cockroaches.   It can also be triggered by hormonal changes, extreme emotion, physical exertion or an upper respiratory tract illness.     It is important to identify the trigger, and then eliminate or avoid the trigger if possible.   If you have been prescribed medications to be taken on a regular basis, it is important to follow the asthma action plan and to follow guidelines to adjust medication in response to increasing symptoms of decreased peak expiratory flow rate  Treatment: I have prescribed: Albuterol (Proventil HFA; Ventolin HFA) 108 (90 Base) MCG/ACT Inhaler 2 puffs into the lungs every six hours as needed for wheezing or shortness of breath and Prednisone 40mg  by mouth per day for 7 days.  I have also refilled your Symbicort.   Approximately 5 minutes was spent documenting and reviewing patient's chart.    HOME CARE . Only take medications as instructed by your medical team. . Consider wearing a mask or scarf to improve breathing air temperature have been shown to decrease irritation and decrease exacerbations . Get rest. . Taking a steamy shower or using a humidifier may help nasal congestion sand ease sore throat pain. You can place a towel over your head and  breathe in the steam from hot water coming from a faucet. . Using a saline nasal spray works much the same way.  . Cough drops, hare candies and sore throat lozenges may ease your cough.  . Avoid close contacts especially the very you and the elderly . Cover your mouth if you cough or sneeze . Always remember to wash your hands.    GET HELP RIGHT AWAY IF: . You develop worsening symptoms; breathlessness at rest, drowsy, confused or agitated, unable to speak in full sentences . You have coughing fits . You develop a severe headache or visual changes . You develop shortness of breath, difficulty breathing or start having chest pain . Your symptoms persist after you have completed your treatment plan . If your symptoms do not improve within 10 days  MAKE SURE YOU . Understand these instructions. . Will watch your condition. . Will get help right away if you are not doing well or get worse.   Your e-visit answers were reviewed by a board certified advanced clinical practitioner to complete your personal care plan, Depending upon the condition, your plan could have included both over the counter or prescription medications.  Please review your pharmacy choice. Your safety is important to [place]. If you have drug allergies check your prescription carefully. You can use MyChart to ask questions about today's visit, request a non-urgent call back, or ask for a work or school excuse for 24 hours related to this e-Visit. If it has been greater than 24 hours you will need to follow up with your provider, or enter a new e-Visit to address those concerns.  You will get an e-mail in the  next two days asking about your experience. I hope that your e-visit has been valuable and will speed your recovery. Thank you for using e-visits.

## 2019-04-18 ENCOUNTER — Other Ambulatory Visit: Payer: Self-pay | Admitting: Family

## 2019-04-18 DIAGNOSIS — J4541 Moderate persistent asthma with (acute) exacerbation: Secondary | ICD-10-CM

## 2019-06-13 DIAGNOSIS — M25569 Pain in unspecified knee: Secondary | ICD-10-CM | POA: Diagnosis not present

## 2019-06-14 ENCOUNTER — Telehealth: Payer: Self-pay | Admitting: *Deleted

## 2019-06-14 DIAGNOSIS — M25561 Pain in right knee: Secondary | ICD-10-CM | POA: Diagnosis not present

## 2019-06-14 NOTE — Telephone Encounter (Signed)
Virtual Visit Pre-Appointment Phone Call  "(Name), I am calling you today to discuss your upcoming appointment. We are currently trying to limit exposure to the virus that causes COVID-19 by seeing patients at home rather than in the office."  1. "What is the BEST phone number to call the day of the visit?" - include this in appointment notes  2. "Do you have or have access to (through a family member/friend) a smartphone with video capability that we can use for your visit?" a. If yes - list this number in appt notes as "cell" (if different from BEST phone #) and list the appointment type as a VIDEO visit in appointment notes b. If no - list the appointment type as a PHONE visit in appointment notes  3. Confirm consent - "In the setting of the current Covid19 crisis, you are scheduled for a (phone or video) visit with your provider on (date) at (time).  Just as we do with many in-office visits, in order for you to participate in this visit, we must obtain consent.  If you'd like, I can send this to your mychart (if signed up) or email for you to review.  Otherwise, I can obtain your verbal consent now.  All virtual visits are billed to your insurance company just like a normal visit would be.  By agreeing to a virtual visit, we'd like you to understand that the technology does not allow for your provider to perform an examination, and thus may limit your provider's ability to fully assess your condition. If your provider identifies any concerns that need to be evaluated in person, we will make arrangements to do so.  Finally, though the technology is pretty good, we cannot assure that it will always work on either your or our end, and in the setting of a video visit, we may have to convert it to a phone-only visit.  In either situation, we cannot ensure that we have a secure connection.  Are you willing to proceed?" STAFF: Did the patient verbally acknowledge consent to telehealth visit? Document  YES/NO here: YES  4. Advise patient to be prepared - "Two hours prior to your appointment, go ahead and check your blood pressure, pulse, oxygen saturation, and your weight (if you have the equipment to check those) and write them all down. When your visit starts, your provider will ask you for this information. If you have an Apple Watch or Kardia device, please plan to have heart rate information ready on the day of your appointment. Please have a pen and paper handy nearby the day of the visit as well."  5. Give patient instructions for MyChart download to smartphone OR Doximity/Doxy.me as below if video visit (depending on what platform provider is using)  6. Inform patient they will receive a phone call 15 minutes prior to their appointment time (may be from unknown caller ID) so they should be prepared to answer    TELEPHONE CALL NOTE  Elmon KirschnerDerek J Ells has been deemed a candidate for a follow-up tele-health visit to limit community exposure during the Covid-19 pandemic. I spoke with the patient via phone to ensure availability of phone/video source, confirm preferred email & phone number, and discuss instructions and expectations.  I reminded Elmon KirschnerDerek J Hulme to be prepared with any vital sign and/or heart rhythm information that could potentially be obtained via home monitoring, at the time of his visit. I reminded Elmon KirschnerDerek J Walby to expect a phone call prior to  his visit.  Raelyn NumberWilliamson, Nea Gittens L, CMA 06/14/2019 6:48 PM   INSTRUCTIONS FOR DOWNLOADING THE MYCHART APP TO SMARTPHONE  - The patient must first make sure to have activated MyChart and know their login information - If Apple, go to [employer] Store and type in MyChart in the search bar and download the app. If Android, ask patient to go to [employer] Play Store and type in CovelMyChart in the search bar and download the app. The app is free but as with any other app downloads, their phone may require them to verify saved payment information or  Apple/Android password.  - The patient will need to then log into the app with their MyChart username and password, and select [facility] as their healthcare provider to link the account. When it is time for your visit, go to the MyChart app, find appointments, and click Begin Video Visit. Be sure to Select Allow for your device to access the Microphone and Camera for your visit. You will then be connected, and your provider will be with you shortly.  **If they have any issues connecting, or need assistance please contact MyChart service desk [phone])**  **If using a computer, in order to ensure the best quality for their visit they will need to use either of the following Internet Browsers: [employer] Edge, or Google Chrome**  IF USING DOXIMITY or DOXY.ME - The patient will receive a link just prior to their visit by text.     FULL LENGTH CONSENT FOR TELE-HEALTH VISIT   I hereby voluntarily request, consent and authorize [facility] and its employed or contracted physicians, physician assistants, nurse practitioners or other licensed health care professionals (the Practitioner), to provide me with telemedicine health care services (the "Services") as deemed necessary by the treating Practitioner. I acknowledge and consent to receive the Services by the Practitioner via telemedicine. I understand that the telemedicine visit will involve communicating with the Practitioner through live audiovisual communication technology and the disclosure of certain medical information by electronic transmission. I acknowledge that I have been given the opportunity to request an in-person assessment or other available alternative prior to the telemedicine visit and am voluntarily participating in the telemedicine visit.  I understand that I have the right to withhold or withdraw my consent to the use of telemedicine in the course of my care at any time, without affecting my right to future care  or treatment, and that the Practitioner or I may terminate the telemedicine visit at any time. I understand that I have the right to inspect all information obtained and/or recorded in the course of the telemedicine visit and may receive copies of available information for a reasonable fee.  I understand that some of the potential risks of receiving the Services via telemedicine include:  [name]. Delay or interruption in medical evaluation due to technological equipment failure or disruption; . Information transmitted may not be sufficient (e.g. poor resolution of images) to allow for appropriate medical decision making by the Practitioner; and/or  . In rare instances, security protocols could fail, causing a breach of personal health information.  Furthermore, I acknowledge that it is my responsibility to provide information about my medical history, conditions and care that is complete and accurate to the best of my ability. I acknowledge that Practitioner's advice, recommendations, and/or decision may be based on factors not within their control, such as incomplete or inaccurate data provided by me or distortions of diagnostic images or specimens that may result from electronic transmissions.  I understand that the practice of medicine is not an exact science and that Practitioner makes no warranties or guarantees regarding treatment outcomes. I acknowledge that I will receive a copy of this consent concurrently upon execution via email to the email address I last provided but may also request a printed copy by calling the office of [facility].    I understand that my insurance will be billed for this visit.   I have read or had this consent read to me. . I understand the contents of this consent, which adequately explains the benefits and risks of the Services being provided via telemedicine.  . I have been provided ample opportunity to ask questions regarding this consent and the Services and have had  my questions answered to my satisfaction. . I give my informed consent for the services to be provided through the use of telemedicine in my medical care  By participating in this telemedicine visit I agree to the above.    Patient called back and agreed to do a video visit with Dr. Gwenlyn Found June 26, 2019 at 9 AM.

## 2019-06-14 NOTE — Telephone Encounter (Signed)
LVM for patient to call back to switch in office visit to virtual visit.

## 2019-06-17 DIAGNOSIS — M25361 Other instability, right knee: Secondary | ICD-10-CM | POA: Diagnosis not present

## 2019-06-17 DIAGNOSIS — M25561 Pain in right knee: Secondary | ICD-10-CM

## 2019-06-17 HISTORY — DX: Pain in right knee: M25.561

## 2019-06-18 NOTE — Telephone Encounter (Signed)
Called pt an changed appointment back to office visit as Dr. Gwenlyn Found will be in office that date. Pt made aware.

## 2019-06-25 ENCOUNTER — Telehealth: Payer: Self-pay | Admitting: Cardiovascular Disease

## 2019-06-25 NOTE — Telephone Encounter (Signed)
LVM, reminding pt of his appt with Dr Gwenlyn Found on 06-25-19.

## 2019-06-26 ENCOUNTER — Ambulatory Visit (INDEPENDENT_AMBULATORY_CARE_PROVIDER_SITE_OTHER): Payer: Federal, State, Local not specified - PPO | Admitting: Cardiovascular Disease

## 2019-06-26 ENCOUNTER — Other Ambulatory Visit: Payer: Self-pay

## 2019-06-26 ENCOUNTER — Encounter: Payer: Self-pay | Admitting: Cardiovascular Disease

## 2019-06-26 DIAGNOSIS — R002 Palpitations: Secondary | ICD-10-CM

## 2019-06-26 DIAGNOSIS — I10 Essential (primary) hypertension: Secondary | ICD-10-CM

## 2019-06-26 NOTE — Assessment & Plan Note (Signed)
Mr. Austin Curry continues to have palpitations occurring on a monthly basis lasting for minutes at a time.  He is unaware of these but is told about them from people who randomly check his pulse.  He did have an event monitor that showed sinus rhythm/sinus tachycardia.  He is cut out all his of his caffeine.  I reassured him that the likelihood that this is a arrhythmia requiring treatment is fairly low.  In addition, his blood pressure is pretty soft which would make treatment difficult.

## 2019-06-26 NOTE — Progress Notes (Signed)
06/26/2019 Austin Kirschnererek J Lunsford   Jul 04, 1981  [id number]  Primary Physician Evelene CroonNafziger, Kandee Keenory, NP Primary Cardiologist: Runell GessJonathan J Jessenya Berdan MD Nicholes CalamityFACP, FACC, FAHA, [place]  HPI:  Austin KirschnerDerek J Mcelhannon is a 38 y.o.   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 Amador CunasKwiatkowski for palpitations.  He saw Joni ReiningKathryn Lawrence NP prior to his visit with me. He otherwise has no cardiac risk factors.[name].  He is never had a heart attack or stroke.  An event monitor performed 05/30/2018 showed only sinus rhythm/sinus tachycardia.  Since I saw him a year ago he is done well.  He has injured his right knee.  He continues to work for the [employer] service.  He gets occasional palpitations occurring on a monthly basis lasting minutes at a time which she is unaware of.   Current Meds  Medication Sig  . albuterol (PROVENTIL HFA;VENTOLIN HFA) 108 (90 Base) MCG/ACT inhaler Inhale 1-2 puffs into the lungs every 6 (six) hours as needed for wheezing or shortness of breath.  [name]. albuterol (PROVENTIL HFA;VENTOLIN HFA) 108 (90 Base) MCG/ACT inhaler Inhale 2 puffs into the lungs every 6 (six) hours as needed for wheezing or shortness of breath.  [name]. albuterol (PROVENTIL) (2.5 MG/3ML) 0.083% nebulizer solution Take 3 mLs (2.5 mg total) by nebulization every 4 (four) hours as needed for wheezing or shortness of breath.  . budesonide-formoterol (SYMBICORT) 160-4.5 MCG/ACT inhaler Inhale 2 puffs into the lungs 2 (two) times daily.  . cetirizine (ZYRTEC) 10 MG tablet Take 10 mg by mouth daily.  . fluticasone (FLONASE) 50 MCG/ACT nasal spray Place 1 spray into both nostrils daily.  . meloxicam (MOBIC) 15 MG tablet Take 15 mg by mouth daily.  . metoprolol succinate (TOPROL XL) 25 MG 24 hr tablet Take 1 tablet (25 mg total) by mouth daily.  . traMADol (ULTRAM) 50 MG tablet Take 50 mg by mouth daily.     No Known Allergies  Social History   Socioeconomic History  . Marital status: Single   Spouse name: Not on file  . Number of children: Not on file  . Years of education: Not on file  . Highest education level: Not on file  Occupational History  . 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 Financial resource strain: Not on file  . Food insecurity    Worry: Not on file    Inability: Not on file  . Transportation needs    Medical: Not on file    Non-medical: Not on file  Tobacco Use  . Smoking status: Former Smoker    Packs/day: 0.25    Years: 15.00    Pack years: 3.75    Types: Cigarettes    Quit date: 12/09/2014    Years since quitting: 4.5  . Smokeless tobacco: Never Used  . Tobacco comment: quit smoking in January  Substance and Sexual Activity  . Alcohol use: Yes    Alcohol/week: 0.0 standard drinks    Comment: occ  . Drug use: No  . Sexual activity: Not on file  Lifestyle  . Physical activity    Days per week: Not on file    Minutes per session: Not on file  . Stress: Not on file  Relationships  . Social Musicianconnections    Talks on phone: Not on file    Gets together: Not on file    Attends religious service: Not on file  Active member of club or organization: Not on file    Attends meetings of clubs or organizations: Not on file    Relationship status: Not on file  . Intimate partner violence    Fear of current or ex partner: Not on file    Emotionally abused: Not on file    Physically abused: Not on file    Forced sexual activity: Not on file  Other Topics Concern  . Not on file  Social History Narrative   Married    No children         Review of Systems: General: negative for chills, fever, night sweats or weight changes.  Cardiovascular: negative for chest pain, dyspnea on exertion, edema, orthopnea, palpitations, paroxysmal nocturnal dyspnea or shortness of breath Dermatological: negative for rash Respiratory: negative for cough or wheezing Urologic: negative for hematuria Abdominal: negative for nausea, vomiting,  diarrhea, bright red blood per rectum, melena, or hematemesis Neurologic: negative for visual changes, syncope, or dizziness All other systems reviewed and are otherwise negative except as noted above.    Blood pressure 94/70, pulse 77, temperature 97.9 F (36.6 C), height 6\' 3"  (1.905 m), weight 180 lb (81.6 kg).  General appearance: alert and no distress Neck: no adenopathy, no carotid bruit, no JVD, supple, symmetrical, trachea midline and thyroid not enlarged, symmetric, no tenderness/mass/nodules Lungs: clear to auscultation bilaterally Heart: regular rate and rhythm, S1, S2 normal, no murmur, click, rub or gallop Extremities: extremities normal, atraumatic, no cyanosis or edema Pulses: 2+ and symmetric Skin: Skin color, texture, turgor normal. No rashes or lesions Neurologic: Alert and oriented X 3, normal strength and tone. Normal symmetric reflexes. Normal coordination and gait  EKG normal sinus rhythm at 77 without ST or T wave changes.  I personally reviewed this EKG  ASSESSMENT AND PLAN:   Palpitations Mr. Austin Curry continues to have palpitations occurring on a monthly basis lasting for minutes at a time.  He is unaware of these but is told about them from people who randomly check his pulse.  He did have an event monitor that showed sinus rhythm/sinus tachycardia.  He is cut out all his of his caffeine.  I reassured him that the likelihood that this is a arrhythmia requiring treatment is fairly low.  In addition, his blood pressure is pretty soft which would make treatment difficult.  Essential hypertension History of essential hypertension blood pressure measured today at 94/70.  He is on metoprolol.      Lorretta Harp MD FACP,FACC,FAHA, [facility] 06/26/2019 10:11 AM

## 2019-06-26 NOTE — Patient Instructions (Signed)
Medication Instructions:  Your physician recommends that you continue on your current medications as directed. Please refer to the Current Medication list given to you today.  If you need a refill on your cardiac medications before your next appointment, please call your pharmacy.   Lab work: NONE If you have labs (blood work) drawn today and your tests are completely normal, you will receive your results only by: . MyChart Message (if you have MyChart) OR . A paper copy in the mail If you have any lab test that is abnormal or we need to change your treatment, we will call you to review the results.  Testing/Procedures: NONE  Follow-Up: At [facility], you and your health needs are our priority.  As part of our continuing mission to provide you with exceptional heart care, we have created designated Provider Care Teams.  These Care Teams include your primary Cardiologist (physician) and Advanced Practice Providers (APPs -  Physician Assistants and Nurse Practitioners) who all work together to provide you with the care you need, when you need it. . You may schedule a follow up appointment AS NEEDED. You may see Dr. Berry or one of the following Advanced Practice Providers on your designated Care Team:   . Luke Kilroy, PA-C . Hao Meng, PA-C . Angela Duke, PA-C . Kathryn Lawrence, DNP . Rhonda Barrett, PA-C . Krista Kroeger, PA-C     

## 2019-06-26 NOTE — Progress Notes (Signed)
E 

## 2019-06-26 NOTE — Assessment & Plan Note (Signed)
History of essential hypertension blood pressure measured today at 94/70.  He is on metoprolol.

## 2019-07-01 DIAGNOSIS — M25561 Pain in right knee: Secondary | ICD-10-CM | POA: Diagnosis not present

## 2019-07-03 DIAGNOSIS — M25561 Pain in right knee: Secondary | ICD-10-CM | POA: Diagnosis not present

## 2019-07-08 DIAGNOSIS — M25561 Pain in right knee: Secondary | ICD-10-CM | POA: Diagnosis not present

## 2019-07-09 DIAGNOSIS — M25561 Pain in right knee: Secondary | ICD-10-CM | POA: Diagnosis not present

## 2019-07-23 ENCOUNTER — Other Ambulatory Visit: Payer: Self-pay

## 2019-07-23 ENCOUNTER — Ambulatory Visit (INDEPENDENT_AMBULATORY_CARE_PROVIDER_SITE_OTHER): Payer: Federal, State, Local not specified - PPO | Admitting: Adult Health

## 2019-07-23 ENCOUNTER — Encounter: Payer: Self-pay | Admitting: Adult Health

## 2019-07-23 VITALS — BP 110/72 | Temp 98.7°F | Ht 72.75 in | Wt 182.0 lb

## 2019-07-23 DIAGNOSIS — Z114 Encounter for screening for human immunodeficiency virus [HIV]: Secondary | ICD-10-CM | POA: Diagnosis not present

## 2019-07-23 DIAGNOSIS — Z0000 Encounter for general adult medical examination without abnormal findings: Secondary | ICD-10-CM | POA: Diagnosis not present

## 2019-07-23 DIAGNOSIS — R002 Palpitations: Secondary | ICD-10-CM | POA: Diagnosis not present

## 2019-07-23 DIAGNOSIS — J4541 Moderate persistent asthma with (acute) exacerbation: Secondary | ICD-10-CM

## 2019-07-23 LAB — LIPID PANEL
Cholesterol: 167 mg/dL (ref 0–200)
HDL: 47.1 mg/dL (ref >39.00)
LDL Cholesterol: 109 mg/dL — ABNORMAL HIGH (ref 0–99)
NonHDL: 119.85
Total CHOL/HDL Ratio: 4
Triglycerides: 56 mg/dL (ref 0.0–149.0)
VLDL: 11.2 mg/dL (ref 0.0–40.0)

## 2019-07-23 LAB — COMPREHENSIVE METABOLIC PANEL
ALT: 9 U/L (ref 0–53)
AST: 17 U/L (ref 0–37)
Albumin: 4.3 g/dL (ref 3.5–5.2)
Alkaline Phosphatase: 46 U/L (ref 39–117)
BUN: 14 mg/dL (ref 6–23)
CO2: 32 mEq/L (ref 19–32)
Calcium: 9.7 mg/dL (ref 8.4–10.5)
Chloride: 105 mEq/L (ref 96–112)
Creatinine, Ser: 1.09 mg/dL (ref 0.40–1.50)
GFR: 91.84 mL/min (ref >60.00)
Glucose, Bld: 93 mg/dL (ref 70–99)
Potassium: 4.5 mEq/L (ref 3.5–5.1)
Sodium: 141 mEq/L (ref 135–145)
Total Bilirubin: 0.5 mg/dL (ref 0.2–1.2)
Total Protein: 6.4 g/dL (ref 6.0–8.3)

## 2019-07-23 LAB — CBC WITH DIFFERENTIAL/PLATELET
Basophils Absolute: 0 10*3/uL (ref 0.0–0.1)
Basophils Relative: 0.7 % (ref 0.0–3.0)
Eosinophils Absolute: 0.1 10*3/uL (ref 0.0–0.7)
Eosinophils Relative: 1.5 % (ref 0.0–5.0)
HCT: 43.2 % (ref 39.0–52.0)
Hemoglobin: 14.3 g/dL (ref 13.0–17.0)
Lymphocytes Relative: 30.3 % (ref 12.0–46.0)
Lymphs Abs: 1.1 10*3/uL (ref 0.7–4.0)
MCHC: 33.2 g/dL (ref 30.0–36.0)
MCV: 88.5 fl (ref 78.0–100.0)
Monocytes Absolute: 0.2 10*3/uL (ref 0.1–1.0)
Monocytes Relative: 6.9 % (ref 3.0–12.0)
Neutro Abs: 2.2 10*3/uL (ref 1.4–7.7)
Neutrophils Relative %: 60.6 % (ref 43.0–77.0)
Platelets: 192 10*3/uL (ref 150.0–400.0)
RBC: 4.88 Mil/uL (ref 4.22–5.81)
RDW: 13.4 % (ref 11.5–15.5)
WBC: 3.6 10*3/uL — ABNORMAL LOW (ref 4.0–10.5)

## 2019-07-23 LAB — TSH: TSH: 0.66 u[IU]/mL (ref 0.35–4.50)

## 2019-07-23 MED ORDER — ALBUTEROL SULFATE (2.5 MG/3ML) 0.083% IN NEBU: 2.5000 mg | INHALATION_SOLUTION | RESPIRATORY_TRACT | 3 refills | Status: DC | PRN

## 2019-07-23 NOTE — Progress Notes (Signed)
Subjective:    Patient ID: Austin Curry, male    DOB: 22-Mar-1981, 38 y.o.   MRN: [id number]  HPI Patient presents for yearly preventative medicine examination. He is a pleasant 38 year old male who  has a past medical history of Asthma, Hernia, and Hypertension.  Asthma -he is managed by pulmonary.  Is prescribed rescue inhaler PRN albuterol nebulizer as needed. His prescription for Symbicort ran out and he has not been back to see Pulmonary. He feels controlled off of Symbicort.   He feels as though his symptoms are well controlled  Palpitations -has been managed by cardiology in the past for palpitations mostly present with lifting heavy objects.  His event monitor showed sinus rhythm/sinus tachycardia without arrhythmias.  He was placed on a beta-blocker   All immunizations and health maintenance protocols were reviewed with the patient and needed orders were placed.  Appropriate screening laboratory values were ordered for the patient including screening of hyperlipidemia, renal function and hepatic function.  Medication reconciliation,  past medical history, social history, problem list and allergies were reviewed in detail with the patient  Goals were established with regard to weight loss, exercise, and  diet in compliance with medications . He stays active at work and is trying to eat healthy.    Review of Systems  Constitutional: Negative.   HENT: Negative.   Eyes: Negative.   Respiratory: Negative.   Cardiovascular: Negative.   Gastrointestinal: Negative.   Endocrine: Negative.   Genitourinary: Negative.   Musculoskeletal: Negative.   Skin: Negative.   Allergic/Immunologic: Negative.   Neurological: Negative.   Hematological: Negative.   Psychiatric/Behavioral: Negative.   All other systems reviewed and are negative.  Past Medical History:  Diagnosis Date  . Asthma   . Hernia   . Hypertension     Social History   Socioeconomic History  . Marital status:  Single    Spouse name: Not on file  . Number of children: Not on file  . Years of education: Not on file  . Highest education level: Not on file  Occupational History  . 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 Financial resource strain: Not on file  . Food insecurity    Worry: Not on file    Inability: Not on file  . Transportation needs    Medical: Not on file    Non-medical: Not on file  Tobacco Use  . Smoking status: Former Smoker    Packs/day: 0.25    Years: 15.00    Pack years: 3.75    Types: Cigarettes    Quit date: 12/09/2014    Years since quitting: 4.6  . Smokeless tobacco: Never Used  . Tobacco comment: quit smoking in January  Substance and Sexual Activity  . Alcohol use: Yes    Alcohol/week: 0.0 standard drinks    Comment: occ  . Drug use: No  . Sexual activity: Not on file  Lifestyle  . Physical activity    Days per week: Not on file    Minutes per session: Not on file  . Stress: Not on file  Relationships  . Social Musicianconnections    Talks on phone: Not on file    Gets together: Not on file    Attends religious service: Not on file    Active member of club or organization: Not on file    Attends meetings of clubs or organizations: Not on file    Relationship status:  Not on file  . Intimate partner violence    Fear of current or ex partner: Not on file    Emotionally abused: Not on file    Physically abused: Not on file    Forced sexual activity: Not on file  Other Topics Concern  . Not on file  Social History Narrative   Married    No children        Past Surgical History:  Procedure Laterality Date  . INSERTION OF MESH N/A 04/18/2016   Procedure: INSERTION OF MESH;  Surgeon: Coralie Keens, MD;  Location: [facility];  Service: General;  Laterality: N/A;  . VENTRAL HERNIA REPAIR N/A 04/18/2016   Procedure:  VENTRAL HERNIA REPAIR;  Surgeon: Coralie Keens, MD;  Location: [facility];  Service: General;  Laterality: N/A;     Family History  Problem Relation Age of Onset  . Diabetes Mother   . Hypertension Mother   . CVA Mother   . Hypercholesterolemia Mother   . Hyperlipidemia Father   . Asthma Brother   . Asthma Brother     No Known Allergies  Current Outpatient Medications on File Prior to Visit  Medication Sig Dispense Refill  . albuterol (PROVENTIL HFA;VENTOLIN HFA) 108 (90 Base) MCG/ACT inhaler Inhale 1-2 puffs into the lungs every 6 (six) hours as needed for wheezing or shortness of breath. 1 Inhaler 5  . albuterol (PROVENTIL HFA;VENTOLIN HFA) 108 (90 Base) MCG/ACT inhaler Inhale 2 puffs into the lungs every 6 (six) hours as needed for wheezing or shortness of breath. 1 Inhaler 0  . albuterol (PROVENTIL) (2.5 MG/3ML) 0.083% nebulizer solution Take 3 mLs (2.5 mg total) by nebulization every 4 (four) hours as needed for wheezing or shortness of breath. 30 vial 5  . cetirizine (ZYRTEC) 10 MG tablet Take 10 mg by mouth daily.    . meloxicam (MOBIC) 15 MG tablet Take 15 mg by mouth daily.    . metoprolol succinate (TOPROL XL) 25 MG 24 hr tablet Take 1 tablet (25 mg total) by mouth daily. 90 tablet 3  . traMADol (ULTRAM) 50 MG tablet Take 50 mg by mouth daily.     No current facility-administered medications on file prior to visit.     BP 110/72   Temp 98.7 F (37.1 C)   Ht 6' 0.75" (1.848 m)   Wt 182 lb (82.6 kg)   BMI 24.18 kg/m        Objective:   Physical Exam Vitals signs and nursing note reviewed.  Constitutional:      General: He is not in acute distress.    Appearance: Normal appearance. He is not diaphoretic.  HENT:     Head: Normocephalic and atraumatic.     Right Ear: Tympanic membrane, ear canal and external ear normal. There is no impacted cerumen.     Left Ear: Tympanic membrane, ear canal and external ear normal. There is no impacted cerumen.     Nose: Nose normal. No congestion or rhinorrhea.     Mouth/Throat:     Mouth: Mucous membranes are moist.     Pharynx:  Oropharynx is clear. No oropharyngeal exudate.  Eyes:     General: No scleral icterus.       Right eye: No discharge.        Left eye: No discharge.     Conjunctiva/sclera: Conjunctivae normal.     Pupils: Pupils are equal, round, and reactive to light.  Neck:     Musculoskeletal: Normal range of motion  and neck supple.     Thyroid: No thyromegaly.     Vascular: No JVD.     Trachea: No tracheal deviation.  Cardiovascular:     Rate and Rhythm: Normal rate and regular rhythm.     Pulses: Normal pulses.     Heart sounds: Normal heart sounds. No murmur. No friction rub. No gallop.   Pulmonary:     Effort: Pulmonary effort is normal. No respiratory distress.     Breath sounds: Normal breath sounds. No stridor. No wheezing, rhonchi or rales.  Chest:     Chest wall: No tenderness.  Abdominal:     General: Abdomen is flat. Bowel sounds are normal. There is no distension.     Palpations: Abdomen is soft. There is no mass.     Tenderness: There is no abdominal tenderness. There is no right CVA tenderness, left CVA tenderness, guarding or rebound.     Hernia: No hernia is present.  Musculoskeletal: Normal range of motion.        General: No swelling, tenderness, deformity or signs of injury.     Right lower leg: No edema.     Left lower leg: No edema.  Lymphadenopathy:     Cervical: No cervical adenopathy.  Skin:    General: Skin is warm and dry.     Coloration: Skin is not jaundiced or pale.     Findings: No bruising, erythema, lesion or rash.  Neurological:     General: No focal deficit present.     Mental Status: He is alert and oriented to person, place, and time.     Cranial Nerves: No cranial nerve deficit.     Motor: No abnormal muscle tone.     Coordination: Coordination normal.     Deep Tendon Reflexes: Reflexes are normal and symmetric. Reflexes normal.  Psychiatric:        Mood and Affect: Mood normal.        Behavior: Behavior normal.        Thought Content: Thought  content normal.        Judgment: Judgment normal.        Assessment & Plan:  1. Encounter for preventive health examination - Continue to stay active and exercise - Benign exam  - Follow up in one year  - CBC with Differential/Platelet - Comprehensive metabolic panel - Lipid panel - TSH  2. Moderate persistent asthma with exacerbation - Follow up with Pulmonary  - albuterol (PROVENTIL) (2.5 MG/3ML) 0.083% nebulizer solution; Take 3 mLs (2.5 mg total) by nebulization every 4 (four) hours as needed for wheezing or shortness of breath.  Dispense: 30 mL; Refill: 3  3. Palpitations - Continue with BB - CBC with Differential/Platelet - Comprehensive metabolic panel - Lipid panel - TSH  4. Encounter for screening for HIV  - HIV Antibody (routine testing w rflx)  Shirline Freesory Lyna Laningham

## 2019-07-23 NOTE — Patient Instructions (Signed)
It was great seeing you today!  Continue to stay active and eat healthy   Follow up with Pulmonary ( lung doctor)   We will follow up with you regarding your blood work

## 2019-07-24 LAB — HIV ANTIBODY (ROUTINE TESTING W REFLEX): HIV 1&2 Ab, 4th Generation: NONREACTIVE

## 2019-08-05 DIAGNOSIS — M25561 Pain in right knee: Secondary | ICD-10-CM | POA: Diagnosis not present

## 2019-09-04 DIAGNOSIS — M25561 Pain in right knee: Secondary | ICD-10-CM | POA: Diagnosis not present

## 2019-09-26 ENCOUNTER — Encounter: Payer: Self-pay | Admitting: Family Medicine

## 2019-12-03 ENCOUNTER — Telehealth: Payer: Self-pay | Admitting: Pulmonary Disease

## 2019-12-03 NOTE — Telephone Encounter (Signed)
ATC Patient to schedule regular flu vaccine. Left message for Patient to call back to schedule flu vaccine.

## 2019-12-04 ENCOUNTER — Ambulatory Visit: Payer: Federal, State, Local not specified - PPO

## 2019-12-04 NOTE — Telephone Encounter (Signed)
Pt had to cancel and needs to reschedule - please call [phone]

## 2019-12-04 NOTE — Telephone Encounter (Signed)
ATC patient .  LM for Patient to call back to schedule regular flu vaccine.

## 2019-12-05 ENCOUNTER — Ambulatory Visit (INDEPENDENT_AMBULATORY_CARE_PROVIDER_SITE_OTHER): Payer: Federal, State, Local not specified - PPO

## 2019-12-05 ENCOUNTER — Other Ambulatory Visit: Payer: Self-pay

## 2019-12-05 DIAGNOSIS — Z23 Encounter for immunization: Secondary | ICD-10-CM

## 2019-12-05 NOTE — Telephone Encounter (Signed)
Patient received flu vaccine 12/05/19. Nothing further at this time.

## 2020-02-25 ENCOUNTER — Encounter: Payer: Self-pay | Admitting: Adult Health

## 2020-02-25 ENCOUNTER — Telehealth (INDEPENDENT_AMBULATORY_CARE_PROVIDER_SITE_OTHER): Payer: Federal, State, Local not specified - PPO | Admitting: Adult Health

## 2020-02-25 DIAGNOSIS — J069 Acute upper respiratory infection, unspecified: Secondary | ICD-10-CM | POA: Diagnosis not present

## 2020-02-25 NOTE — Progress Notes (Signed)
Virtual Visit via Video Note  I connected with Austin Curry on 02/25/20 at  4:30 PM EST by a video enabled telemedicine application and verified that I am speaking with the correct person using two identifiers.  Location patient: home Location provider:work or home office Persons participating in the virtual visit: patient, provider  I discussed the limitations of evaluation and management by telemedicine and the availability of in person appointments. The patient expressed understanding and agreed to proceed.   HPI: 39 year old male who is being evaluated today for concern of Covid versus asthma exacerbation.  He reports that approximately 1 week ago he was exposed to Covid at work, he found this out yesterday.  Does report that he had his mask on but over the last week he has developed night sweats, chest congestion, with semiproductive cough but dry for the most part, and chest tightness.  He also does endorse wheezing, he has used his nebulizer and albuterol inhaler which helps slightly.  He denies fevers, chills, muscle aches, loss of taste or smell, or shortness of breath.  He is unsure if he is experiencing a mild asthma exacerbation or if he has caught Covid.   ROS: See pertinent positives and negatives per HPI.  Past Medical History:  Diagnosis Date  . Asthma   . Hernia   . Hypertension     Past Surgical History:  Procedure Laterality Date  . INSERTION OF MESH N/A 04/18/2016   Procedure: INSERTION OF MESH;  Surgeon: Abigail Miyamoto, MD;  Location: [facility];  Service: General;  Laterality: N/A;  . VENTRAL HERNIA REPAIR N/A 04/18/2016   Procedure:  VENTRAL HERNIA REPAIR;  Surgeon: Abigail Miyamoto, MD;  Location: [facility];  Service: General;  Laterality: N/A;    Family History  Problem Relation Age of Onset  . Diabetes Mother   . Hypertension Mother   . CVA Mother   . Hypercholesterolemia Mother   . Hyperlipidemia Father   . Asthma Brother   . Asthma Brother         Current Outpatient Medications:  .  albuterol (PROVENTIL HFA;VENTOLIN HFA) 108 (90 Base) MCG/ACT inhaler, Inhale 2 puffs into the lungs every 6 (six) hours as needed for wheezing or shortness of breath., Disp: 1 Inhaler, Rfl: 0 .  albuterol (PROVENTIL) (2.5 MG/3ML) 0.083% nebulizer solution, Take 3 mLs (2.5 mg total) by nebulization every 4 (four) hours as needed for wheezing or shortness of breath., Disp: 30 mL, Rfl: 3 .  cetirizine (ZYRTEC) 10 MG tablet, Take 10 mg by mouth daily., Disp: , Rfl:  .  metoprolol succinate (TOPROL XL) 25 MG 24 hr tablet, Take 1 tablet (25 mg total) by mouth daily., Disp: 90 tablet, Rfl: 3 .  traMADol (ULTRAM) 50 MG tablet, Take 50 mg by mouth daily., Disp: , Rfl:   EXAM:  VITALS per patient if applicable:  GENERAL: alert, oriented, appears well and in no acute distress  HEENT: atraumatic, conjunttiva clear, no obvious abnormalities on inspection of external nose and ears  NECK: normal movements of the head and neck  LUNGS: on inspection no signs of respiratory distress, breathing rate appears normal, no obvious gross SOB, gasping or wheezing  CV: no obvious cyanosis  MS: moves all visible extremities without noticeable abnormality  PSYCH/NEURO: pleasant and cooperative, no obvious depression or anxiety, speech and thought processing grossly intact  ASSESSMENT AND PLAN:  Discussed the following assessment and plan:  1. Upper respiratory tract infection, unspecified type -Likely asthma exacerbation due to history and  recent exposure we will have him get Covid testing done.  He will get online and schedule an appointment with [facility] for tomorrow morning.  He was advised to follow-up with me once he gets his test results back     I discussed the assessment and treatment plan with the patient. The patient was provided an opportunity to ask questions and all were answered. The patient agreed with the plan and demonstrated an  understanding of the instructions.   The patient was advised to call back or seek an in-person evaluation if the symptoms worsen or if the condition fails to improve as anticipated.   Dorothyann Peng, NP

## 2020-02-26 ENCOUNTER — Ambulatory Visit: Payer: Federal, State, Local not specified - PPO | Attending: Internal Medicine

## 2020-02-26 ENCOUNTER — Other Ambulatory Visit: Payer: Self-pay | Admitting: Family

## 2020-02-26 ENCOUNTER — Telehealth: Payer: Self-pay | Admitting: Adult Health

## 2020-02-26 ENCOUNTER — Encounter: Payer: Self-pay | Admitting: Family Medicine

## 2020-02-26 DIAGNOSIS — Z20822 Contact with and (suspected) exposure to covid-19: Secondary | ICD-10-CM

## 2020-02-26 DIAGNOSIS — J4541 Moderate persistent asthma with (acute) exacerbation: Secondary | ICD-10-CM

## 2020-02-26 NOTE — Telephone Encounter (Signed)
Patient was exposed last week Tuesday and was told Monday that he was around the person that tested positive and tested today.   Can you write a note for the patient's work stating that he is quarantine until he gets the results back.  Patient does have underlining health issues.  Please advise  Patient needs today for work

## 2020-02-26 NOTE — Telephone Encounter (Signed)
Ok for letter

## 2020-02-26 NOTE — Telephone Encounter (Signed)
Spoke to the pt and informed him that I have released a letter to [employer] for his employer.  Nothing further needed.

## 2020-02-27 ENCOUNTER — Encounter: Payer: Self-pay | Admitting: Family Medicine

## 2020-02-27 ENCOUNTER — Telehealth: Payer: Self-pay | Admitting: Adult Health

## 2020-02-27 LAB — NOVEL CORONAVIRUS, NAA: SARS-CoV-2, NAA: NOT DETECTED

## 2020-02-27 MED ORDER — DOXYCYCLINE HYCLATE 100 MG PO TABS: 100.0000 mg | ORAL_TABLET | Freq: Two times a day (BID) | ORAL | 0 refills | Status: DC

## 2020-02-27 MED ORDER — PREDNISONE 10 MG PO TABS: 10.0000 mg | ORAL_TABLET | Freq: Every day | ORAL | 0 refills | Status: DC

## 2020-02-27 NOTE — Telephone Encounter (Signed)
Patient notified that both prescriptions have been sent to the pharmacy.  Nothing further needed.

## 2020-02-27 NOTE — Telephone Encounter (Signed)
Pt wanted to provide Covid-19 results and it shows not detected. Also would like a call back to go over what the next steps would be for treating his upper respiratory issue.

## 2020-02-27 NOTE — Telephone Encounter (Signed)
I am glad that he tested negative for Covid.   We can send in Doxycyline 100 mg BID x 7 days and Prednisone 10 mg daily x 5 days

## 2020-03-02 ENCOUNTER — Telehealth: Payer: Self-pay | Admitting: Pulmonary Disease

## 2020-03-02 DIAGNOSIS — R0989 Other specified symptoms and signs involving the circulatory and respiratory systems: Secondary | ICD-10-CM

## 2020-03-02 NOTE — Telephone Encounter (Signed)
Noted.  Please call patient let him know that he needs to come in little bit earlier to receive a chest x-ray.  Okay to go ahead and place order stat.Elisha Headland, FNP

## 2020-03-02 NOTE — Telephone Encounter (Signed)
Thanks  Brian

## 2020-03-02 NOTE — Telephone Encounter (Signed)
Called pt and informed him to come 15 min early for his appt to get a CXR prior to appt. Appt notes changed to reflect this and order placed for stat CXR. Pt verbalized understanding and denied any further questions or concerns at this time.   Will forward to [facility] as FYI.

## 2020-03-02 NOTE — Telephone Encounter (Signed)
Called and spoke to pt. Pt states he had a video visit with PCP on 3/2 because of acute symptoms (chest tightness, congestion, fatigue, cough), pt was tested for COVID, resulted negative. Pt states he was given Doxy and pred on 3/4 and pt hasnt felt much improvement. Pt is requesting an appt with our office. Appt made with Elisha Headland, NP, for 3/9 at 0930. Pt verbalized understanding and is aware to contact our office if any s/s worsen.   Will forward to [place] as FYI.

## 2020-03-02 NOTE — Progress Notes (Signed)
@Patient  ID: , male    DOB: 03/23/1981, 39 y.o.   MRN: [id number]  Chief Complaint  Patient presents with  . Follow-up    Chest tightness and congestion for the past week. Productive cough. Denies any SOB.     Referring provider: [id number], NP  HPI:  39 year old male former smoker followed in our office for asthma  PMH: Environmental allergies, hypertension Smoker/ Smoking History: Former Smoker.  Quit 2015.  3.75-pack-year smoking history Maintenance:  ?  Symbicort Pt of: Dr. 2016   03/03/2020  - Visit   39 year old male former smoker followed in our office for asthma.  He was last seen in 2019.  At that time he was maintained on Symbicort.  He has not been seen in our office since then.  Patient developed an upper respiratory infection last month.  Completed a video visit with primary care.  Patient was Covid tested and found to be negative.  Patient was treated with doxycycline as well as short course of prednisone.  Patient feels that he still has increased congestion.  Chest x-ray today was clear not showing any pneumonia.  Patient does carry a diagnosis of asthma with significant environmental allergies.  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  He is currently not on a maintenance inhaler.  He reports that he was managed on Symbicort then he ran out of refills and forgot to follow back up with our office.  He believes breathing was stable while on Symbicort.  In prior to the upper respiratory infection he was stable as well.  Occasionally needing to use his rescue inhaler or nebulizer but not routinely.  Tests:   02/27/2017-respiratory allergy panel-multiple elevations, highest with 04/29/2017 grass, Box Elder IgE, common silver birch, oak, pecan hickory tree, ragweed, sheep Sorrell, rough pigweed, IgE-595  02/27/2017-CBC with differential-eosinophils relative 4.2, eosinophils absolute  0.3  08/23/2016-pulmonary function test-FVC FVC 5.0 (98% predicted), postbronchodilator ratio 73, postbronchodilator FEV1 3.97 (94% predicted), reversibility in FEV1 as well as mid flows after bronchodilator, DLCO 41.42 (109% predicted)  FENO:  Lab Results  Component Value Date   NITRICOXIDE 7 09/26/2016    PFT: PFT Results Latest Ref Rng & Units 08/23/2016  FVC-Pre L 5.04  FVC-Predicted Pre % 98  FVC-Post L 5.45  FVC-Predicted Post % 106  Pre FEV1/FVC % % 67  Post FEV1/FCV % % 73  FEV1-Pre L 3.38  FEV1-Predicted Pre % 80  FEV1-Post L 3.97  DLCO UNC% % 109  DLCO COR %Predicted % 116  TLC L 14.98  TLC % Predicted % 193  RV % Predicted % 511    WALK:  No flowsheet data found.  Imaging: DG Chest 2 View  Result Date: 03/03/2020 CLINICAL DATA:  Chest congestion. EXAM: CHEST - 2 VIEW COMPARISON:  February 19, 2018. FINDINGS: The heart size and mediastinal contours are within normal limits. Both lungs are clear. No pneumothorax or pleural effusion is noted. The visualized skeletal structures are unremarkable. IMPRESSION: No active cardiopulmonary disease. Electronically Signed   By: February 21, 2018 M.D.   On: 03/03/2020 09:23    Lab Results:  CBC    Component Value Date/Time   WBC 3.6 (L) 07/23/2019 0750   RBC 4.88 07/23/2019 0750   HGB 14.3 07/23/2019 0750   HCT 43.2 07/23/2019 0750   PLT 192.0 07/23/2019 0750   MCV 88.5 07/23/2019 0750   MCH 29.8 02/19/2018 0932  MCHC 33.2 07/23/2019 0750   RDW 13.4 07/23/2019 0750   LYMPHSABS 1.1 07/23/2019 0750   MONOABS 0.2 07/23/2019 0750   EOSABS 0.1 07/23/2019 0750   BASOSABS 0.0 07/23/2019 0750    BMET    Component Value Date/Time   NA 141 07/23/2019 0750   NA 143 05/22/2018 1547   K 4.5 07/23/2019 0750   CL 105 07/23/2019 0750   CO2 32 07/23/2019 0750   GLUCOSE 93 07/23/2019 0750   BUN 14 07/23/2019 0750   BUN 17 05/22/2018 1547   CREATININE 1.09 07/23/2019 0750   CALCIUM 9.7 07/23/2019 0750   GFRNONAA 99  05/22/2018 1547   GFRAA 114 05/22/2018 1547    BNP No results found for: BNP  ProBNP No results found for: PROBNP  Specialty Problems      Pulmonary Problems   Asthma, chronic    02/27/2017-respiratory allergy panel-multiple elevations, highest with [place] grass, Box Elder IgE, common silver birch, oak, pecan hickory tree, ragweed, sheep Sorrell, rough pigweed, IgE-595  02/27/2017-CBC with differential-eosinophils relative 4.2, eosinophils absolute 0.3  08/23/2016-pulmonary function test-FVC FVC 5.0 (98% predicted), postbronchodilator ratio 73, postbronchodilator FEV1 3.97 (94% predicted), reversibility in FEV1 as well as mid flows after bronchodilator, DLCO 41.42 (109% predicted)      Asthma exacerbation    PFT's  08/23/16   FEV1 3.97 (94 % ) ratio 73  p 17 % improvement from saba p nothing prior to study - 02/27/2017  After extensive coaching HFA effectiveness =    90% > change to symb 160 2bid  - Allergy profile 02/27/2017 >  Eos 0.3 /  IgE  595 multiple Pos RAST          No Known Allergies  Immunization History  Administered Date(s) Administered  . Influenza,inj,Quad PF,6+ Mos 09/05/2016, 11/22/2017, 01/17/2019, 12/05/2019  . PPD Test 09/11/2013  . Tdap 06/25/2013    Past Medical History:  Diagnosis Date  . Asthma   . Hernia   . Hypertension     Tobacco History: Social History   Tobacco Use  Smoking Status Former Smoker  . Packs/day: 0.25  . Years: 15.00  . Pack years: 3.75  . Types: Cigarettes  . Quit date: 12/09/2014  . Years since quitting: 5.2  Smokeless Tobacco Never Used  Tobacco Comment   quit smoking in January   Counseling given: Yes Comment: quit smoking in January   Continue to not smoke  Outpatient Encounter Medications as of 03/03/2020  Medication Sig  . albuterol (PROVENTIL HFA;VENTOLIN HFA) 108 (90 Base) MCG/ACT inhaler Inhale 2 puffs into the lungs every 6 (six) hours as needed for wheezing or shortness of breath.  [name] albuterol (PROVENTIL)  (2.5 MG/3ML) 0.083% nebulizer solution Take 3 mLs (2.5 mg total) by nebulization every 4 (four) hours as needed for wheezing or shortness of breath.  . cetirizine (ZYRTEC) 10 MG tablet Take 10 mg by mouth daily.  [name] doxycycline (VIBRA-TABS) 100 MG tablet Take 1 tablet (100 mg total) by mouth 2 (two) times daily.  . metoprolol succinate (TOPROL XL) 25 MG 24 hr tablet Take 1 tablet (25 mg total) by mouth daily.  . [DISCONTINUED] predniSONE (DELTASONE) 10 MG tablet Take 1 tablet (10 mg total) by mouth daily with breakfast.  . budesonide-formoterol (SYMBICORT) 80-4.5 MCG/ACT inhaler 2 puffs in the morning AS NEEDED right when you wake up, rinse out your mouth after use, Can repeat in 12 hours later 2 puffs AS NEEDED, rinse after use  . predniSONE (DELTASONE) 10 MG tablet  4 tabs for 2 days, then 3 tabs for 2 days, 2 tabs for 2 days, then 1 tab for 2 days, then stop  . [DISCONTINUED] traMADol (ULTRAM) 50 MG tablet Take 50 mg by mouth daily.   No facility-administered encounter medications on file as of 03/03/2020.     Review of Systems  Review of Systems  Constitutional: Negative for activity change, chills, fatigue, fever and unexpected weight change.  HENT: Positive for congestion, postnasal drip and rhinorrhea. Negative for sinus pressure, sinus pain and sore throat.   Eyes: Negative.   Respiratory: Positive for cough, shortness of breath and wheezing.   Cardiovascular: Negative for chest pain, palpitations and leg swelling.  Gastrointestinal: Negative for diarrhea, nausea and vomiting.  Endocrine: Negative.   Genitourinary: Negative.   Musculoskeletal: Negative.   Skin: Negative.   Neurological: Negative for dizziness and headaches.  Psychiatric/Behavioral: Negative.  Negative for dysphoric mood. The patient is not nervous/anxious.   All other systems reviewed and are negative.    Physical Exam  BP 128/82 (BP Location: Left Arm, Patient Position: Sitting, Cuff Size: Normal)   Pulse 66    Temp 98.6 F (37 C) (Temporal)   Ht 6' 0.75" (1.848 m)   Wt 188 lb 6.4 oz (85.5 kg)   SpO2 98% Comment: on RA  BMI 25.03 kg/m   Wt Readings from Last 5 Encounters:  03/03/20 188 lb 6.4 oz (85.5 kg)  07/23/19 182 lb (82.6 kg)  06/26/19 180 lb (81.6 kg)  01/17/19 182 lb (82.6 kg)  07/18/18 171 lb 9.6 oz (77.8 kg)    BMI Readings from Last 5 Encounters:  03/03/20 25.03 kg/m  07/23/19 24.18 kg/m  06/26/19 22.50 kg/m  01/17/19 23.21 kg/m  07/18/18 21.88 kg/m     Physical Exam Vitals and nursing note reviewed.  Constitutional:      General: He is not in acute distress.    Appearance: Normal appearance. He is obese.  HENT:     Head: Normocephalic and atraumatic.     Right Ear: Hearing and external ear normal.     Left Ear: Hearing and external ear normal.     Nose: Rhinorrhea present. No mucosal edema.     Right Turbinates: Not enlarged.     Left Turbinates: Not enlarged.     Mouth/Throat:     Mouth: Mucous membranes are dry.     Pharynx: Oropharynx is clear. No oropharyngeal exudate or posterior oropharyngeal erythema.  Eyes:     Pupils: Pupils are equal, round, and reactive to light.  Cardiovascular:     Rate and Rhythm: Normal rate and regular rhythm.     Pulses: Normal pulses.     Heart sounds: Normal heart sounds. No murmur.  Pulmonary:     Effort: Pulmonary effort is normal.     Breath sounds: Normal breath sounds. No decreased breath sounds, wheezing, rhonchi or rales.  Chest:     Chest wall: No tenderness.  Musculoskeletal:     Cervical back: Normal range of motion.     Right lower leg: No edema.     Left lower leg: No edema.  Lymphadenopathy:     Cervical: No cervical adenopathy.  Skin:    General: Skin is warm and dry.     Capillary Refill: Capillary refill takes less than 2 seconds.     Findings: No erythema or rash.  Neurological:     General: No focal deficit present.     Mental Status: He is alert and oriented  to person, place, and time.      Motor: No weakness.     Coordination: Coordination normal.     Gait: Gait is intact. Gait normal.  Psychiatric:        Mood and Affect: Mood normal.        Behavior: Behavior normal. Behavior is cooperative.        Thought Content: Thought content normal.        Judgment: Judgment normal.       Assessment & Plan:   Asthma, chronic History of elevated IgE History of peripheral eosinophilia Managed well on Symbicort in the past, ran out of refills Last seen in our office in 2019 Recent upper respiratory infection, suspect more asthmatic bronchitis Previously treated with doxycycline as well as a short course of steroids Chest x-ray clear on exam today Breath sounds clear to auscultation  Plan: Prednisone taper today Do not believe patient needs additional antibiotics We will get patient back on Symbicort 80 which he can use as a maintenance inhaler as needed per the [place] guidelines Explained to patient to follow Symbicort instructions on AVS, contact us if he has any difficulty with insurance coverage Encourage patient to continue to use Zyrtec Encourage patient to start using daily nasal saline rinses to help with environmental allergies May need to consider Biologics in the future if patient frequently flares or requires additional prednisone tapers We will get patient set up with a new pulmonologist in our office as Dr. Kendrick Fries is no longer seeing patients in the outpatient setting  History of environmental allergies Plan: Continue Zyrtec Start nasal saline rinses once daily, sample provided today     Return in about 2 months (around 05/03/2020), or if symptoms worsen or fail to improve, for NEW PULMONOLOGIST IN SLOT.   Coral Ceo, NP 03/03/2020   This appointment required 32 minutes of patient care (this includes precharting, chart review, review of results, face-to-face care, etc.).

## 2020-03-03 ENCOUNTER — Encounter: Payer: Self-pay | Admitting: Pulmonary Disease

## 2020-03-03 ENCOUNTER — Ambulatory Visit (INDEPENDENT_AMBULATORY_CARE_PROVIDER_SITE_OTHER): Payer: Federal, State, Local not specified - PPO

## 2020-03-03 ENCOUNTER — Ambulatory Visit: Payer: Federal, State, Local not specified - PPO | Admitting: Pulmonary Disease

## 2020-03-03 ENCOUNTER — Other Ambulatory Visit: Payer: Self-pay

## 2020-03-03 VITALS — BP 128/82 | HR 66 | Temp 98.6°F | Ht 72.75 in | Wt 188.4 lb

## 2020-03-03 DIAGNOSIS — J4531 Mild persistent asthma with (acute) exacerbation: Secondary | ICD-10-CM

## 2020-03-03 DIAGNOSIS — J4541 Moderate persistent asthma with (acute) exacerbation: Secondary | ICD-10-CM | POA: Diagnosis not present

## 2020-03-03 DIAGNOSIS — Z9109 Other allergy status, other than to drugs and biological substances: Secondary | ICD-10-CM

## 2020-03-03 DIAGNOSIS — R0989 Other specified symptoms and signs involving the circulatory and respiratory systems: Secondary | ICD-10-CM | POA: Diagnosis not present

## 2020-03-03 HISTORY — DX: Other allergy status, other than to drugs and biological substances: Z91.09

## 2020-03-03 MED ORDER — ALBUTEROL SULFATE (2.5 MG/3ML) 0.083% IN NEBU: 2.5000 mg | INHALATION_SOLUTION | RESPIRATORY_TRACT | 3 refills | Status: DC | PRN

## 2020-03-03 MED ORDER — BUDESONIDE-FORMOTEROL FUMARATE 80-4.5 MCG/ACT IN AERO: INHALATION_SPRAY | RESPIRATORY_TRACT | 12 refills | Status: DC

## 2020-03-03 MED ORDER — ALBUTEROL SULFATE HFA 108 (90 BASE) MCG/ACT IN AERS: 2.0000 | INHALATION_SPRAY | Freq: Four times a day (QID) | RESPIRATORY_TRACT | 4 refills | Status: DC | PRN

## 2020-03-03 MED ORDER — PREDNISONE 10 MG PO TABS: ORAL_TABLET | ORAL | 0 refills | Status: DC

## 2020-03-03 NOTE — Progress Notes (Signed)
Discussed results with patient in office.  Nothing further is needed at this time.  Liliani Bobo FNP  

## 2020-03-03 NOTE — Assessment & Plan Note (Signed)
History of elevated IgE History of peripheral eosinophilia Managed well on Symbicort in the past, ran out of refills Last seen in our office in 2019 Recent upper respiratory infection, suspect more asthmatic bronchitis Previously treated with doxycycline as well as a short course of steroids Chest x-ray clear on exam today Breath sounds clear to auscultation  Plan: Prednisone taper today Do not believe patient needs additional antibiotics We will get patient back on Symbicort 80 which he can use as a maintenance inhaler as needed per the [place] guidelines Explained to patient to follow Symbicort instructions on AVS, contact us if he has any difficulty with insurance coverage Encourage patient to continue to use Zyrtec Encourage patient to start using daily nasal saline rinses to help with environmental allergies May need to consider Biologics in the future if patient frequently flares or requires additional prednisone tapers We will get patient set up with a new pulmonologist in our office as Dr. Kendrick Fries is no longer seeing patients in the outpatient setting

## 2020-03-03 NOTE — Addendum Note (Signed)
Addended by: Maurene Capes on: 03/03/2020 10:46 AM   Modules accepted: Orders

## 2020-03-03 NOTE — Patient Instructions (Addendum)
You were seen today by Lauraine Rinne, NP  for:   It was a pleasure seeing you again.  Thank you for coming in so that way we can address your symptoms.  We will treat you with a short course of prednisone.  We will also get you back on a maintenance inhaler Symbicort 80 which you can use as needed as explained below.  If you have any questions regarding using your inhalers or accessing your inhalers please do not hesitate to give [place] a call.  Dr. Lake Bells is no longer seeing patients in the outpatient setting so we will work to get you established with a new pulmonologist.  Stay safe and take care,  Aaron Edelman  1. Mild persistent chronic asthma with acute exacerbation  - budesonide-formoterol (SYMBICORT) 80-4.5 MCG/ACT inhaler; 2 puffs in the morning AS NEEDED right when you wake up, rinse out your mouth after use, Can repeat in 12 hours later 2 puffs AS NEEDED, rinse after use  Dispense: 1 Inhaler; Refill: 12  - predniSONE (DELTASONE) 10 MG tablet; 4 tabs for 2 days, then 3 tabs for 2 days, 2 tabs for 2 days, then 1 tab for 2 days, then stop  Dispense: 20 tablet; Refill: 0   2. History of environmental allergies  Continue Zyrtec daily  Start nasal saline rinses 1 time daily, okay to increase to 2-3 times daily if you feel clinical benefit Use distilled water Get water lukewarm like a baby bottle Shake well Sample provided today   We recommend today:   Meds ordered this encounter  Medications  . budesonide-formoterol (SYMBICORT) 80-4.5 MCG/ACT inhaler    Sig: 2 puffs in the morning AS NEEDED right when you wake up, rinse out your mouth after use, Can repeat in 12 hours later 2 puffs AS NEEDED, rinse after use    Dispense:  1 Inhaler    Refill:  12  . predniSONE (DELTASONE) 10 MG tablet    Sig: 4 tabs for 2 days, then 3 tabs for 2 days, 2 tabs for 2 days, then 1 tab for 2 days, then stop    Dispense:  20 tablet    Refill:  0    Follow Up:    Return in about 2 months (around  05/03/2020), or if symptoms worsen or fail to improve, for NEW PULMONOLOGIST IN 57min SLOT.   Prefer: [facility] or [facility]   Please do your part to reduce the spread of COVID-19:      Reduce your risk of any infection  and COVID19 by using the similar precautions used for avoiding the common cold or flu:  [name] Wash your hands often with soap and warm water for at least 20 seconds.  If soap and water are not readily available, use an alcohol-based hand sanitizer with at least 60% alcohol.  . If coughing or sneezing, cover your mouth and nose by coughing or sneezing into the elbow areas of your shirt or coat, into a tissue or into your sleeve (not your hands). Langley Gauss A MASK when in public  . Avoid shaking hands with others and consider head nods or verbal greetings only. . Avoid touching your eyes, nose, or mouth with unwashed hands.  . Avoid close contact with people who are sick. . Avoid places or events with large numbers of people in one location, like concerts or sporting events. . If you have some symptoms but not all symptoms, continue to monitor at home and seek medical attention if your  symptoms worsen. . If you are having a medical emergency, call 911.   ADDITIONAL HEALTHCARE OPTIONS FOR PATIENTS  [facility] / e-Visit: [email or web address]         [facility]: [phone]  [name]: [phone]                   [facility]: [phone]     It is flu season:   >>> Best ways to protect herself from the flu: Receive the yearly flu vaccine, practice good hand hygiene washing with soap and also using hand sanitizer when available, eat a nutritious meals, get adequate rest, hydrate appropriately   Please contact the office if your symptoms worsen or you have concerns that you are not improving.   Thank you for choosing [facility] for your healthcare, and for allowing [place] to  partner with you on your healthcare journey. I am thankful to be able to provide care to you today.   Elisha Headland FNP-C

## 2020-03-03 NOTE — Assessment & Plan Note (Signed)
Plan: Continue Zyrtec Start nasal saline rinses once daily, sample provided today

## 2020-04-24 ENCOUNTER — Emergency Department (HOSPITAL_BASED_OUTPATIENT_CLINIC_OR_DEPARTMENT_OTHER)
Admission: EM | Admit: 2020-04-24 | Discharge: 2020-04-24 | Disposition: A | Discharge status: Home / Self Care | Payer: Federal, State, Local not specified - PPO | Attending: Emergency Medicine | Admitting: Emergency Medicine

## 2020-04-24 ENCOUNTER — Encounter (HOSPITAL_BASED_OUTPATIENT_CLINIC_OR_DEPARTMENT_OTHER): Payer: Self-pay

## 2020-04-24 ENCOUNTER — Emergency Department (HOSPITAL_BASED_OUTPATIENT_CLINIC_OR_DEPARTMENT_OTHER): Payer: Federal, State, Local not specified - PPO

## 2020-04-24 ENCOUNTER — Other Ambulatory Visit: Payer: Self-pay

## 2020-04-24 DIAGNOSIS — S8991XA Unspecified injury of right lower leg, initial encounter: Secondary | ICD-10-CM | POA: Diagnosis not present

## 2020-04-24 DIAGNOSIS — T1490XA Injury, unspecified, initial encounter: Secondary | ICD-10-CM

## 2020-04-24 DIAGNOSIS — J45909 Unspecified asthma, uncomplicated: Secondary | ICD-10-CM | POA: Diagnosis not present

## 2020-04-24 DIAGNOSIS — I10 Essential (primary) hypertension: Secondary | ICD-10-CM | POA: Diagnosis not present

## 2020-04-24 DIAGNOSIS — Y999 Unspecified external cause status: Secondary | ICD-10-CM | POA: Diagnosis not present

## 2020-04-24 DIAGNOSIS — M7989 Other specified soft tissue disorders: Secondary | ICD-10-CM | POA: Diagnosis not present

## 2020-04-24 DIAGNOSIS — Y929 Unspecified place or not applicable: Secondary | ICD-10-CM | POA: Insufficient documentation

## 2020-04-24 DIAGNOSIS — Z87891 Personal history of nicotine dependence: Secondary | ICD-10-CM | POA: Insufficient documentation

## 2020-04-24 DIAGNOSIS — W19XXXA Unspecified fall, initial encounter: Secondary | ICD-10-CM | POA: Diagnosis not present

## 2020-04-24 DIAGNOSIS — Y939 Activity, unspecified: Secondary | ICD-10-CM | POA: Insufficient documentation

## 2020-04-24 DIAGNOSIS — M25561 Pain in right knee: Secondary | ICD-10-CM | POA: Diagnosis not present

## 2020-04-24 DIAGNOSIS — Z79899 Other long term (current) drug therapy: Secondary | ICD-10-CM | POA: Insufficient documentation

## 2020-04-24 DIAGNOSIS — R52 Pain, unspecified: Secondary | ICD-10-CM | POA: Diagnosis not present

## 2020-04-24 DIAGNOSIS — R0902 Hypoxemia: Secondary | ICD-10-CM | POA: Diagnosis not present

## 2020-04-24 DIAGNOSIS — X509XXA Other and unspecified overexertion or strenuous movements or postures, initial encounter: Secondary | ICD-10-CM | POA: Insufficient documentation

## 2020-04-24 MED ORDER — HYDROCODONE-ACETAMINOPHEN 5-325 MG PO TABS
1.0000 | ORAL_TABLET | Freq: Once | ORAL | Status: AC
  Administered 2020-04-24: 21:00:00 1 via ORAL
  Filled 2020-04-24: qty 1

## 2020-04-24 MED ORDER — HYDROCODONE-ACETAMINOPHEN 5-325 MG PO TABS: 1.0000 | ORAL_TABLET | Freq: Four times a day (QID) | ORAL | 0 refills | Status: DC | PRN

## 2020-04-24 NOTE — ED Notes (Signed)
ED Provider at bedside. 

## 2020-04-24 NOTE — ED Provider Notes (Signed)
[facility] Provider Note   CSN: [id number] Arrival date & time: 04/24/20  1908     History Chief Complaint  Patient presents with  . Knee Pain    Austin Curry is a 39 y.o. male.  HPI HPI Comments: Austin Curry is a 39 y.o. male who presents to the Emergency Department complaining of sudden 10/10 right knee pain.  Patient was ambulating down a set of concrete stairs and planted his right leg and felt a "pop" resulting in his pain.  He states his right kneecap was dislocated laterally to the right and then spontaneously relocated without assistance.  He notes exquisite pain circumferentially around the knee that is worse along the right lateral joint line.  He is unable to move his right knee due to the pain.  EMS was called and transported to the emergency department today and he was given 200 mcg of fentanyl in route.  He states his current pain is about a 6/10 at rest.  He reports a history of right knee injury but none of this severity.  Per records he is followed by Dr. Tonita Cong at [facility].  5 years ago he had an x-ray of the right knee showing patella alta with a patellar tendon injury.  He denies any other symptoms at this time.   Past Medical History:  Diagnosis Date  . Asthma   . Hernia   . Hypertension     Patient Active Problem List   Diagnosis Date Noted  . History of environmental allergies 03/03/2020  . Essential hypertension 06/22/2018  . Palpitations 05/30/2018  . Right shoulder pain 11/23/2017  . Asthma exacerbation 04/08/2016  . Ventral hernia 01/12/2016  . Asthma, chronic 06/25/2013    Past Surgical History:  Procedure Laterality Date  . INSERTION OF MESH N/A 04/18/2016   Procedure: INSERTION OF MESH;  Surgeon: Coralie Keens, MD;  Location: [facility];  Service: General;  Laterality: N/A;  . VENTRAL HERNIA REPAIR N/A 04/18/2016   Procedure:  VENTRAL HERNIA REPAIR;  Surgeon: Coralie Keens, MD;  Location: [facility];   Service: General;  Laterality: N/A;       Family History  Problem Relation Age of Onset  . Diabetes Mother   . Hypertension Mother   . CVA Mother   . Hypercholesterolemia Mother   . Hyperlipidemia Father   . Asthma Brother   . Asthma Brother     Social History   Tobacco Use  . Smoking status: Former Smoker    Packs/day: 0.25    Years: 15.00    Pack years: 3.75    Types: Cigarettes    Quit date: 12/09/2014    Years since quitting: 5.3  . Smokeless tobacco: Never Used  . Tobacco comment: quit smoking in January  Substance Use Topics  . Alcohol use: Yes    Alcohol/week: 0.0 standard drinks    Comment: occ  . Drug use: No    Home Medications Prior to Admission medications   Medication Sig Start Date End Date Taking? Authorizing Provider  albuterol (PROVENTIL) (2.5 MG/3ML) 0.083% nebulizer solution Take 3 mLs (2.5 mg total) by nebulization every 4 (four) hours as needed for wheezing or shortness of breath. 03/03/20   Lauraine Rinne, NP  albuterol (VENTOLIN HFA) 108 (90 Base) MCG/ACT inhaler Inhale 2 puffs into the lungs every 6 (six) hours as needed for wheezing or shortness of breath. 03/03/20   Lauraine Rinne, NP  budesonide-formoterol (SYMBICORT) 80-4.5 MCG/ACT inhaler 2 puffs  in the morning AS NEEDED right when you wake up, rinse out your mouth after use, Can repeat in 12 hours later 2 puffs AS NEEDED, rinse after use 03/03/20   Coral Ceo, NP  cetirizine (ZYRTEC) 10 MG tablet Take 10 mg by mouth daily.    [provider]  doxycycline (VIBRA-TABS) 100 MG tablet Take 1 tablet (100 mg total) by mouth 2 (two) times daily. 02/27/20   Nafziger, Kandee Keen, NP  metoprolol succinate (TOPROL XL) 25 MG 24 hr tablet Take 1 tablet (25 mg total) by mouth daily. 06/22/18   Runell Gess, MD  predniSONE (DELTASONE) 10 MG tablet 4 tabs for 2 days, then 3 tabs for 2 days, 2 tabs for 2 days, then 1 tab for 2 days, then stop 03/03/20   Coral Ceo, NP    Allergies    Patient has no known  allergies.  Review of Systems   Review of Systems  Respiratory: Negative for shortness of breath.   Cardiovascular: Negative for chest pain.  Musculoskeletal: Positive for arthralgias and joint swelling.  Skin: Negative for color change and wound.  Neurological: Negative for syncope, weakness and numbness.   Physical Exam Updated Vital Signs BP 125/82 (BP Location: Right Arm)   Pulse 68   Temp 98.3 F (36.8 C) (Oral)   Resp 15   Ht 6\' 3"  (1.905 m)   Wt 84.8 kg   SpO2 95%   BMI 23.37 kg/m   Physical Exam Vitals and nursing note reviewed.  Constitutional:      General: He is not in acute distress.    Appearance: Normal appearance. He is normal weight. He is not ill-appearing, toxic-appearing or diaphoretic.  HENT:     Head: Normocephalic and atraumatic.     Right Ear: External ear normal.     Left Ear: External ear normal.     Nose: Nose normal.     Mouth/Throat:     Pharynx: Oropharynx is clear.  Eyes:     Extraocular Movements: Extraocular movements intact.  Cardiovascular:     Rate and Rhythm: Normal rate.     Pulses: Normal pulses.  Pulmonary:     Effort: Pulmonary effort is normal.  Abdominal:     General: Abdomen is flat.  Musculoskeletal:        General: Swelling, tenderness and signs of injury present. No deformity.     Cervical back: Normal range of motion.     Comments: Exquisite TTP noted circumferentially around the right knee.  Pain is worst along the right lateral joint line.  Unable to assess range of motion of the right knee secondary to pain.  No tenderness noted along the pelvis.  No tenderness or signs of injury along the right ankle or foot.  Patient able to wiggle the toes in the bilateral feet spontaneously without difficulty.  Distal sensation is intact.  2+ pedal pulses noted bilaterally.  Skin:    General: Skin is warm and dry.  Neurological:     General: No focal deficit present.     Mental Status: He is alert and oriented to person, place,  and time.     Comments: Patient is oriented to person, place, time.  Psychiatric:        Mood and Affect: Mood normal.        Behavior: Behavior normal.    ED Results / Procedures / Treatments   Labs (all labs ordered are listed, but only abnormal results are displayed) Labs Reviewed -  No data to display  EKG None  Radiology DG Knee Right Port  Result Date: 04/24/2020 CLINICAL DATA:  Right knee pain and swelling. EXAM: PORTABLE RIGHT KNEE - 1-2 VIEW COMPARISON:  June 13, 2019 FINDINGS: No evidence of fracture, dislocation, or joint effusion. No evidence of arthropathy or other focal bone abnormality. Soft tissues are unremarkable. IMPRESSION: Negative. Electronically Signed   By: Aram Candela M.D.   On: 04/24/2020 19:48    Procedures Procedures   Medications Ordered in ED Medications - No data to display  ED Course  I have reviewed the triage vital signs and the nursing notes.  Pertinent labs & imaging results that were available during my care of the patient were reviewed by me and considered in my medical decision making (see chart for details).    MDM Rules/Calculators/A&P                      Patient is a 39 year old male that presents with sudden onset right knee pain after a right lateral patella dislocation which spontaneously reduced without intervention.  He was given 200 mcg of fentanyl by EMS in route.  Physical exam is significant for diffuse tenderness throughout the right knee that is worst along the right lateral joint line.  Unable to assess any range of motion of the right knee secondary to the patient's pain.  His right lower extremity is neurovascularly intact both proximal and distal to the injury.  X-ray of the right knee was negative for any acute findings.  He does report a history of injury to the right knee.  He has been evaluated by Dr. Shelle Iron at [facility] for this.  I have recommended that he follow-up with him next week if his symptoms do not  begin to improve.  Patient was placed in a knee immobilizer.  His wife actually brought crutches from home, so he can continue to use those.  He was given a short course of pain medication for the weekend.  I recommended he additionally take naproxen or ibuprofen.  I recommended ice.  Range of motion exercises as his pain permits.  He understands he can return to the emergency department with any new or worsening symptoms.  He and his wife were amicable with the above plan and verbalized understanding.  His vital signs are stable the time of discharge.  Patient discharged to home/self care.  Condition at discharge: Stable  Note: Portions of this report may have been transcribed using voice recognition software. Every effort was made to ensure accuracy; however, inadvertent computerized transcription errors may be present.    Final Clinical Impression(s) / ED Diagnoses Final diagnoses:  Injury  Acute pain of right knee    Rx / DC Orders ED Discharge Orders         Ordered    HYDROcodone-acetaminophen (NORCO/VICODIN) 5-325 MG tablet  Every 6 hours PRN     04/24/20 2031           Placido Sou, PA-C 04/24/20 2041    Alvira Monday, MD 04/25/20 1441

## 2020-04-24 NOTE — Discharge Instructions (Addendum)
Per our discussion, I would recommend that you follow-up with your orthopedist Dr. Shelle Iron on Monday to discuss his visit.  You have been provided a knee immobilizer which I would recommend wearing throughout the day while you are walking around.  Feel free to take it off and perform exercises with the right leg as your pain tolerates.  I am prescribing you a short course of pain medication.  You only need to take this as needed for breakthrough pain.  It can have a constipating effect, so please make sure to eat a lot of fiber and stay hydrated while taking this.  You might even want to consider taking a stool softener.  You can take ibuprofen or naproxen as well for your pain.  The pain medication I am prescribing you has Tylenol in it, so please make sure not to exceed 4000 mg of Tylenol a day.  Please return to the emergency department with any new or worsening symptoms.  It was a pleasure to meet you.

## 2020-04-24 NOTE — ED Notes (Signed)
Pt c/o "all over itching" after fentanyl admin by EMS

## 2020-04-24 NOTE — ED Triage Notes (Signed)
Pt walking down steps today approx 1715, states right knee cap became displaced to the right . Hx of same approx 1 year ago. 200 mcg fentanyl given by EMS. NAD, A&Ox4, States he is not able to put pressure or walk at current time. Swelling noted.

## 2020-04-25 ENCOUNTER — Other Ambulatory Visit: Payer: Self-pay

## 2020-04-25 ENCOUNTER — Emergency Department (HOSPITAL_COMMUNITY): Payer: Federal, State, Local not specified - PPO

## 2020-04-25 ENCOUNTER — Emergency Department (HOSPITAL_COMMUNITY)
Admission: EM | Admit: 2020-04-25 | Discharge: 2020-04-25 | Disposition: A | Discharge status: Home / Self Care | Payer: Federal, State, Local not specified - PPO | Attending: Emergency Medicine | Admitting: Emergency Medicine

## 2020-04-25 ENCOUNTER — Encounter (HOSPITAL_COMMUNITY): Payer: Self-pay | Admitting: Emergency Medicine

## 2020-04-25 DIAGNOSIS — M79661 Pain in right lower leg: Secondary | ICD-10-CM | POA: Diagnosis not present

## 2020-04-25 DIAGNOSIS — S8991XD Unspecified injury of right lower leg, subsequent encounter: Secondary | ICD-10-CM | POA: Diagnosis not present

## 2020-04-25 DIAGNOSIS — G8911 Acute pain due to trauma: Secondary | ICD-10-CM | POA: Insufficient documentation

## 2020-04-25 DIAGNOSIS — S99911A Unspecified injury of right ankle, initial encounter: Secondary | ICD-10-CM | POA: Diagnosis not present

## 2020-04-25 DIAGNOSIS — I10 Essential (primary) hypertension: Secondary | ICD-10-CM | POA: Insufficient documentation

## 2020-04-25 DIAGNOSIS — X500XXD Overexertion from strenuous movement or load, subsequent encounter: Secondary | ICD-10-CM | POA: Insufficient documentation

## 2020-04-25 DIAGNOSIS — Z79899 Other long term (current) drug therapy: Secondary | ICD-10-CM | POA: Insufficient documentation

## 2020-04-25 DIAGNOSIS — Z87891 Personal history of nicotine dependence: Secondary | ICD-10-CM | POA: Diagnosis not present

## 2020-04-25 DIAGNOSIS — M25461 Effusion, right knee: Secondary | ICD-10-CM | POA: Diagnosis not present

## 2020-04-25 MED ORDER — IBUPROFEN 600 MG PO TABS
600.0000 mg | ORAL_TABLET | Freq: Four times a day (QID) | ORAL | 0 refills | Status: DC | PRN
Start: 2020-04-25 — End: 2021-01-12

## 2020-04-25 MED ORDER — HYDROCODONE-ACETAMINOPHEN 5-325 MG PO TABS
1.0000 | ORAL_TABLET | Freq: Once | ORAL | Status: AC
  Administered 2020-04-25: 1 via ORAL
  Filled 2020-04-25: qty 1

## 2020-04-25 MED ORDER — KETOROLAC TROMETHAMINE 60 MG/2ML IM SOLN
30.0000 mg | Freq: Once | INTRAMUSCULAR | Status: AC
  Administered 2020-04-25: 30 mg via INTRAMUSCULAR
  Filled 2020-04-25: qty 2

## 2020-04-25 MED ORDER — HYDROCODONE-ACETAMINOPHEN 5-325 MG PO TABS: 1.0000 | ORAL_TABLET | Freq: Four times a day (QID) | ORAL | 0 refills | Status: DC | PRN

## 2020-04-25 MED ORDER — METHOCARBAMOL 750 MG PO TABS
750.0000 mg | ORAL_TABLET | Freq: Two times a day (BID) | ORAL | 0 refills | Status: DC | PRN
Start: 2020-04-25 — End: 2021-02-15

## 2020-04-25 NOTE — Progress Notes (Signed)
Orthopedic Tech Progress Note Patient Details:  Austin Curry 03-23-1981 [id number]  Ortho Devices Type of Ortho Device: Knee Immobilizer Ortho Device/Splint Interventions: Application   Post Interventions Patient Tolerated: Well   [name] 04/25/2020, 6:05 PM

## 2020-04-25 NOTE — ED Notes (Signed)
Patient verbalizes understanding of discharge instructions. Opportunity for questioning and answers were provided. Armband removed by staff, pt discharged from ED and ambulated with crutches to car with significant other.

## 2020-04-25 NOTE — ED Notes (Signed)
Pt transported to CT ?

## 2020-04-25 NOTE — Discharge Instructions (Addendum)
Your CT did not show that you had a fracture today. However you need to follow-up with the orthopedic provider that you have seen in the past listed below or at the Ortho urgent care which is located at this address as well. You can call them on Monday morning to schedule an appointment. Continue taking the Norco as well as the Robaxin and ibuprofen as needed to help with your symptoms. Use the crutches. Elevate, ice your knee as tolerated to help with swelling. Return to the ER if you start to experience worsening pain or swelling, tightness in your knee or leg, additional injuries or numbness.
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
## 2020-04-25 NOTE — ED Notes (Signed)
Patient transported to CT 

## 2020-04-27 DIAGNOSIS — M25561 Pain in right knee: Secondary | ICD-10-CM | POA: Diagnosis not present

## 2020-04-30 DIAGNOSIS — M25561 Pain in right knee: Secondary | ICD-10-CM | POA: Diagnosis not present

## 2020-04-30 DIAGNOSIS — M25461 Effusion, right knee: Secondary | ICD-10-CM | POA: Diagnosis not present

## 2020-05-07 DIAGNOSIS — M25561 Pain in right knee: Secondary | ICD-10-CM | POA: Diagnosis not present

## 2020-05-08 DIAGNOSIS — M25561 Pain in right knee: Secondary | ICD-10-CM | POA: Diagnosis not present

## 2020-05-15 DIAGNOSIS — M25561 Pain in right knee: Secondary | ICD-10-CM | POA: Diagnosis not present

## 2020-05-17 ENCOUNTER — Other Ambulatory Visit: Payer: Self-pay | Admitting: Pulmonary Disease

## 2020-05-17 DIAGNOSIS — J4541 Moderate persistent asthma with (acute) exacerbation: Secondary | ICD-10-CM

## 2020-05-19 DIAGNOSIS — M25561 Pain in right knee: Secondary | ICD-10-CM | POA: Diagnosis not present

## 2020-05-22 DIAGNOSIS — M25561 Pain in right knee: Secondary | ICD-10-CM | POA: Diagnosis not present

## 2020-05-27 DIAGNOSIS — M25561 Pain in right knee: Secondary | ICD-10-CM | POA: Diagnosis not present

## 2020-05-28 DIAGNOSIS — M25561 Pain in right knee: Secondary | ICD-10-CM | POA: Diagnosis not present

## 2020-06-01 DIAGNOSIS — M25561 Pain in right knee: Secondary | ICD-10-CM | POA: Diagnosis not present

## 2020-06-02 ENCOUNTER — Other Ambulatory Visit: Payer: Self-pay | Admitting: Family Medicine

## 2020-06-04 DIAGNOSIS — M25561 Pain in right knee: Secondary | ICD-10-CM | POA: Diagnosis not present

## 2020-06-11 DIAGNOSIS — M25561 Pain in right knee: Secondary | ICD-10-CM | POA: Diagnosis not present

## 2020-06-15 DIAGNOSIS — M25561 Pain in right knee: Secondary | ICD-10-CM | POA: Diagnosis not present

## 2020-06-17 DIAGNOSIS — M25561 Pain in right knee: Secondary | ICD-10-CM | POA: Diagnosis not present

## 2020-06-23 DIAGNOSIS — M25561 Pain in right knee: Secondary | ICD-10-CM | POA: Diagnosis not present

## 2020-06-25 DIAGNOSIS — M25561 Pain in right knee: Secondary | ICD-10-CM | POA: Diagnosis not present

## 2020-07-01 DIAGNOSIS — M25561 Pain in right knee: Secondary | ICD-10-CM | POA: Diagnosis not present

## 2020-07-03 DIAGNOSIS — M25561 Pain in right knee: Secondary | ICD-10-CM | POA: Diagnosis not present

## 2020-07-07 DIAGNOSIS — M25561 Pain in right knee: Secondary | ICD-10-CM | POA: Diagnosis not present

## 2020-07-09 DIAGNOSIS — M25561 Pain in right knee: Secondary | ICD-10-CM | POA: Diagnosis not present

## 2020-07-14 DIAGNOSIS — M25561 Pain in right knee: Secondary | ICD-10-CM | POA: Diagnosis not present

## 2020-07-28 DIAGNOSIS — M25561 Pain in right knee: Secondary | ICD-10-CM | POA: Diagnosis not present

## 2020-07-30 ENCOUNTER — Other Ambulatory Visit: Payer: Self-pay

## 2020-07-30 ENCOUNTER — Encounter: Payer: Self-pay | Admitting: Adult Health

## 2020-07-30 ENCOUNTER — Ambulatory Visit (INDEPENDENT_AMBULATORY_CARE_PROVIDER_SITE_OTHER): Payer: Federal, State, Local not specified - PPO | Admitting: Adult Health

## 2020-07-30 VITALS — BP 120/82 | Temp 97.5°F | Ht 74.5 in | Wt 197.0 lb

## 2020-07-30 DIAGNOSIS — J4541 Moderate persistent asthma with (acute) exacerbation: Secondary | ICD-10-CM

## 2020-07-30 DIAGNOSIS — Z0000 Encounter for general adult medical examination without abnormal findings: Secondary | ICD-10-CM | POA: Diagnosis not present

## 2020-07-30 DIAGNOSIS — R002 Palpitations: Secondary | ICD-10-CM

## 2020-07-30 DIAGNOSIS — M222X1 Patellofemoral disorders, right knee: Secondary | ICD-10-CM | POA: Diagnosis not present

## 2020-07-30 DIAGNOSIS — M25361 Other instability, right knee: Secondary | ICD-10-CM

## 2020-07-30 DIAGNOSIS — M25561 Pain in right knee: Secondary | ICD-10-CM | POA: Diagnosis not present

## 2020-07-30 HISTORY — DX: Other instability, right knee: M25.361

## 2020-07-30 NOTE — Addendum Note (Signed)
Addended by: Lerry Liner on: 07/30/2020 01:34 PM   Modules accepted: Orders

## 2020-07-30 NOTE — Progress Notes (Signed)
Subjective:    Patient ID: Austin Curry, male    DOB: 1981/06/17, 39 y.o.   MRN: [id number]  HPI Patient presents for yearly preventative medicine examination. He is a pleasant 39 year old male who  has a past medical history of Asthma, Hernia, and Hypertension.  Asthma -managed by pulmonary.  He is prescribed a rescue inhaler as needed and albuterol nebulizer as needed. He uses Symbicort BID during spring and summer. No recent flares   Palpitations -is managed by cardiology.  Currently prescribed metoprolol and feels well controlled with this medication. Only occurs when he lifts heavy objects  Right Knee pain - Injury in April 2021 when walking down stairs. Currently being seen by PT and orthopedics. Was seen by Dr. Stann Mainland earlier today and had a new knee brace put on. Has follow up in 3 weeks to see if surgery is needed  All immunizations and health maintenance protocols were reviewed with the patient and needed orders were placed.  Appropriate screening laboratory values were ordered for the patient including screening of hyperlipidemia, renal function and hepatic function.  Medication reconciliation,  past medical history, social history, problem list and allergies were reviewed in detail with the patient  Goals were established with regard to weight loss, exercise, and  diet in compliance with medications   Review of Systems  Constitutional: Negative.   HENT: Negative.   Eyes: Negative.   Respiratory: Negative.   Cardiovascular: Negative.   Gastrointestinal: Negative.   Endocrine: Negative.   Genitourinary: Negative.   Musculoskeletal: Positive for gait problem and joint swelling.  Skin: Negative.   Allergic/Immunologic: Negative.   Hematological: Negative.   Psychiatric/Behavioral: Negative.   All other systems reviewed and are negative.  Past Medical History:  Diagnosis Date  . Asthma   . Hernia   . Hypertension     Social History   Socioeconomic History    . Marital status: Single    Spouse name: Not on file  . Number of children: Not on file  . Years of education: Not on file  . Highest education level: Not on file  Occupational History  . Occupation: Ship broker: [employer]  Tobacco Use  . Smoking status: Former Smoker    Packs/day: 0.25    Years: 15.00    Pack years: 3.75    Types: Cigarettes    Quit date: 12/09/2014    Years since quitting: 5.6  . Smokeless tobacco: Never Used  . Tobacco comment: quit smoking in January  Vaping Use  . Vaping Use: Never used  Substance and Sexual Activity  . Alcohol use: Yes    Alcohol/week: 0.0 standard drinks    Comment: occ  . Drug use: No  . Sexual activity: Not on file  Other Topics Concern  . Not on file  Social History Narrative   Married    No children       Social Determinants of Health   Financial Resource Strain:   . Difficulty of Paying Living Expenses:   Food Insecurity:   . Worried About Charity fundraiser in the Last Year:   . Arboriculturist in the Last Year:   Transportation Needs:   . Film/video editor (Medical):   [name] Lack of Transportation (Non-Medical):   Physical Activity:   . Days of Exercise per Week:   . Minutes of Exercise per Session:   Stress:   . Feeling of Stress :   Social  Connections:   . Frequency of Communication with Friends and Family:   . Frequency of Social Gatherings with Friends and Family:   . Attends Religious Services:   . Active Member of Clubs or Organizations:   . Attends Archivist Meetings:   [name] Marital Status:   Intimate Partner Violence:   . Fear of Current or Ex-Partner:   . Emotionally Abused:   [name] Physically Abused:   . Sexually Abused:     Past Surgical History:  Procedure Laterality Date  . INSERTION OF MESH N/A 04/18/2016   Procedure: INSERTION OF MESH;  Surgeon: Coralie Keens, MD;  Location: [facility];  Service: General;  Laterality: N/A;  . VENTRAL HERNIA REPAIR N/A 04/18/2016   Procedure:   VENTRAL HERNIA REPAIR;  Surgeon: Coralie Keens, MD;  Location: [facility];  Service: General;  Laterality: N/A;    Family History  Problem Relation Age of Onset  . Diabetes Mother   . Hypertension Mother   . CVA Mother   . Hypercholesterolemia Mother   . Hyperlipidemia Father   . Asthma Brother   . Asthma Brother     No Known Allergies  Current Outpatient Medications on File Prior to Visit  Medication Sig Dispense Refill  . albuterol (PROVENTIL) (2.5 MG/3ML) 0.083% nebulizer solution Take 3 mLs (2.5 mg total) by nebulization every 4 (four) hours as needed for wheezing or shortness of breath. 30 mL 3  . albuterol (VENTOLIN HFA) 108 (90 Base) MCG/ACT inhaler TAKE 2 PUFFS BY MOUTH EVERY 6 HOURS AS NEEDED FOR WHEEZE OR SHORTNESS OF BREATH 18 g 1  . budesonide-formoterol (SYMBICORT) 80-4.5 MCG/ACT inhaler 2 puffs in the morning AS NEEDED right when you wake up, rinse out your mouth after use, Can repeat in 12 hours later 2 puffs AS NEEDED, rinse after use 1 Inhaler 12  . cetirizine (ZYRTEC) 10 MG tablet Take 10 mg by mouth daily.    [name] doxycycline (VIBRA-TABS) 100 MG tablet Take 1 tablet (100 mg total) by mouth 2 (two) times daily. 14 tablet 0  . HYDROcodone-acetaminophen (NORCO/VICODIN) 5-325 MG tablet Take 1-2 tablets by mouth every 6 (six) hours as needed. 20 tablet 0  . ibuprofen (ADVIL) 600 MG tablet Take 1 tablet (600 mg total) by mouth every 6 (six) hours as needed. 30 tablet 0  . methocarbamol (ROBAXIN) 750 MG tablet Take 1 tablet (750 mg total) by mouth 2 (two) times daily as needed for muscle spasms (or muscle tightness). 20 tablet 0  . metoprolol succinate (TOPROL XL) 25 MG 24 hr tablet Take 1 tablet (25 mg total) by mouth daily. 90 tablet 3  . predniSONE (DELTASONE) 10 MG tablet 4 tabs for 2 days, then 3 tabs for 2 days, 2 tabs for 2 days, then 1 tab for 2 days, then stop 20 tablet 0   No current facility-administered medications on file prior to visit.    There were no vitals  taken for this visit.      Objective:   Physical Exam Vitals and nursing note reviewed.  Constitutional:      General: He is not in acute distress.    Appearance: Normal appearance. He is well-developed and normal weight.  HENT:     Head: Normocephalic and atraumatic.     Right Ear: Tympanic membrane, ear canal and external ear normal. There is no impacted cerumen.     Left Ear: Tympanic membrane, ear canal and external ear normal. There is no impacted cerumen.  Nose: Nose normal. No congestion or rhinorrhea.     Mouth/Throat:     Mouth: Mucous membranes are moist.     Pharynx: Oropharynx is clear. No oropharyngeal exudate or posterior oropharyngeal erythema.  Eyes:     General:        Right eye: No discharge.        Left eye: No discharge.     Extraocular Movements: Extraocular movements intact.     Conjunctiva/sclera: Conjunctivae normal.     Pupils: Pupils are equal, round, and reactive to light.  Neck:     Vascular: No carotid bruit.     Trachea: No tracheal deviation.  Cardiovascular:     Rate and Rhythm: Normal rate and regular rhythm.     Pulses: Normal pulses.     Heart sounds: Normal heart sounds. No murmur heard.  No friction rub. No gallop.   Pulmonary:     Effort: Pulmonary effort is normal. No respiratory distress.     Breath sounds: Normal breath sounds. No stridor. No wheezing, rhonchi or rales.  Chest:     Chest wall: No tenderness.  Abdominal:     General: Bowel sounds are normal. There is no distension.     Palpations: Abdomen is soft. There is no mass.     Tenderness: There is no abdominal tenderness. There is no right CVA tenderness, left CVA tenderness, guarding or rebound.     Hernia: No hernia is present.  Musculoskeletal:        General: No swelling, tenderness, deformity or signs of injury. Normal range of motion.     Right lower leg: No edema.     Left lower leg: No edema.     Comments: Right knee in brace   Lymphadenopathy:     Cervical:  No cervical adenopathy.  Skin:    General: Skin is warm and dry.     Capillary Refill: Capillary refill takes less than 2 seconds.     Coloration: Skin is not jaundiced or pale.     Findings: No bruising, erythema, lesion or rash.  Neurological:     General: No focal deficit present.     Mental Status: He is alert and oriented to person, place, and time.     Cranial Nerves: No cranial nerve deficit.     Sensory: No sensory deficit.     Motor: No weakness.     Coordination: Coordination normal.     Gait: Gait normal.     Deep Tendon Reflexes: Reflexes normal.     Comments: Walks with single crutch    Psychiatric:        Mood and Affect: Mood normal.        Behavior: Behavior normal.        Thought Content: Thought content normal.        Judgment: Judgment normal.       Assessment & Plan:  1. Routine general medical examination at a health care facility  - CBC with Differential/Platelet; Future - TSH; Future - CMP with eGFR([facility]); Future  2. Moderate persistent asthma with exacerbation - Well controlled.  - Follow up with pulmonary as directed  3. Palpitations - Continue with metoprolol   Dorothyann Peng, NP

## 2020-07-31 ENCOUNTER — Other Ambulatory Visit: Payer: Self-pay | Admitting: Adult Health

## 2020-07-31 DIAGNOSIS — N289 Disorder of kidney and ureter, unspecified: Secondary | ICD-10-CM

## 2020-07-31 DIAGNOSIS — R7989 Other specified abnormal findings of blood chemistry: Secondary | ICD-10-CM

## 2020-07-31 LAB — COMPLETE METABOLIC PANEL WITH GFR
AG Ratio: 1.8 (calc) (ref 1.0–2.5)
ALT: 8 U/L — ABNORMAL LOW (ref 9–46)
AST: 13 U/L (ref 10–40)
Albumin: 4.4 g/dL (ref 3.6–5.1)
Alkaline phosphatase (APISO): 58 U/L (ref 36–130)
BUN/Creatinine Ratio: 13 (calc) (ref 6–22)
BUN: 19 mg/dL (ref 7–25)
CO2: 28 mmol/L (ref 20–32)
Calcium: 9.7 mg/dL (ref 8.6–10.3)
Chloride: 104 mmol/L (ref 98–110)
Creat: 1.5 mg/dL — ABNORMAL HIGH (ref 0.60–1.35)
GFR, Est African American: 67 mL/min/{1.73_m2} (ref >=60)
GFR, Est Non African American: 58 mL/min/{1.73_m2} — ABNORMAL LOW (ref >=60)
Globulin: 2.4 g/dL (calc) (ref 1.9–3.7)
Glucose, Bld: 64 mg/dL — ABNORMAL LOW (ref 65–99)
Potassium: 4.3 mmol/L (ref 3.5–5.3)
Sodium: 141 mmol/L (ref 135–146)
Total Bilirubin: 0.6 mg/dL (ref 0.2–1.2)
Total Protein: 6.8 g/dL (ref 6.1–8.1)

## 2020-07-31 LAB — CBC WITH DIFFERENTIAL/PLATELET
Absolute Monocytes: 354 cells/uL (ref 200–950)
Basophils Absolute: 18 cells/uL (ref 0–200)
Basophils Relative: 0.3 %
Eosinophils Absolute: 90 cells/uL (ref 15–500)
Eosinophils Relative: 1.5 %
HCT: 44.8 % (ref 38.5–50.0)
Hemoglobin: 14.9 g/dL (ref 13.2–17.1)
Lymphs Abs: 1782 cells/uL (ref 850–3900)
MCH: 28.9 pg (ref 27.0–33.0)
MCHC: 33.3 g/dL (ref 32.0–36.0)
MCV: 86.8 fL (ref 80.0–100.0)
MPV: 12.2 fL (ref 7.5–12.5)
Monocytes Relative: 5.9 %
Neutro Abs: 3756 cells/uL (ref 1500–7800)
Neutrophils Relative %: 62.6 %
Platelets: 201 10*3/uL (ref 140–400)
RBC: 5.16 10*6/uL (ref 4.20–5.80)
RDW: 12.8 % (ref 11.0–15.0)
Total Lymphocyte: 29.7 %
WBC: 6 10*3/uL (ref 3.8–10.8)

## 2020-07-31 LAB — TSH: TSH: 0.38 mIU/L — ABNORMAL LOW (ref 0.40–4.50)

## 2020-08-03 ENCOUNTER — Encounter: Payer: Federal, State, Local not specified - PPO | Admitting: Adult Health

## 2020-08-24 DIAGNOSIS — M25361 Other instability, right knee: Secondary | ICD-10-CM | POA: Diagnosis not present

## 2020-09-02 ENCOUNTER — Other Ambulatory Visit: Payer: Self-pay

## 2020-09-02 ENCOUNTER — Other Ambulatory Visit: Payer: Federal, State, Local not specified - PPO

## 2020-09-02 DIAGNOSIS — N289 Disorder of kidney and ureter, unspecified: Secondary | ICD-10-CM | POA: Diagnosis not present

## 2020-09-02 DIAGNOSIS — R7989 Other specified abnormal findings of blood chemistry: Secondary | ICD-10-CM

## 2020-09-03 LAB — COMPLETE METABOLIC PANEL WITH GFR
AG Ratio: 1.8 (calc) (ref 1.0–2.5)
ALT: 8 U/L — ABNORMAL LOW (ref 9–46)
AST: 14 U/L (ref 10–40)
Albumin: 4.1 g/dL (ref 3.6–5.1)
Alkaline phosphatase (APISO): 49 U/L (ref 36–130)
BUN: 22 mg/dL (ref 7–25)
CO2: 26 mmol/L (ref 20–32)
Calcium: 9.2 mg/dL (ref 8.6–10.3)
Chloride: 105 mmol/L (ref 98–110)
Creat: 1.12 mg/dL (ref 0.60–1.35)
GFR, Est African American: 96 mL/min/{1.73_m2} (ref >=60)
GFR, Est Non African American: 83 mL/min/{1.73_m2} (ref >=60)
Globulin: 2.3 g/dL (calc) (ref 1.9–3.7)
Glucose, Bld: 84 mg/dL (ref 65–99)
Potassium: 4.3 mmol/L (ref 3.5–5.3)
Sodium: 137 mmol/L (ref 135–146)
Total Bilirubin: 0.3 mg/dL (ref 0.2–1.2)
Total Protein: 6.4 g/dL (ref 6.1–8.1)

## 2020-09-03 LAB — T4, FREE: Free T4: 1.2 ng/dL (ref 0.8–1.8)

## 2020-09-03 LAB — T3, FREE: T3, Free: 3.5 pg/mL (ref 2.3–4.2)

## 2020-09-03 LAB — TSH: TSH: 1.13 mIU/L (ref 0.40–4.50)

## 2020-09-21 DIAGNOSIS — M25361 Other instability, right knee: Secondary | ICD-10-CM | POA: Diagnosis not present

## 2020-11-16 DIAGNOSIS — M25361 Other instability, right knee: Secondary | ICD-10-CM | POA: Diagnosis not present

## 2020-12-01 DIAGNOSIS — M25561 Pain in right knee: Secondary | ICD-10-CM | POA: Diagnosis not present

## 2020-12-01 DIAGNOSIS — M25361 Other instability, right knee: Secondary | ICD-10-CM | POA: Diagnosis not present

## 2020-12-03 DIAGNOSIS — M25561 Pain in right knee: Secondary | ICD-10-CM | POA: Diagnosis not present

## 2020-12-09 DIAGNOSIS — M25561 Pain in right knee: Secondary | ICD-10-CM | POA: Diagnosis not present

## 2020-12-11 DIAGNOSIS — M25361 Other instability, right knee: Secondary | ICD-10-CM | POA: Diagnosis not present

## 2020-12-11 DIAGNOSIS — M25561 Pain in right knee: Secondary | ICD-10-CM | POA: Diagnosis not present

## 2020-12-14 DIAGNOSIS — M25561 Pain in right knee: Secondary | ICD-10-CM | POA: Diagnosis not present

## 2020-12-16 DIAGNOSIS — S83004D Unspecified dislocation of right patella, subsequent encounter: Secondary | ICD-10-CM | POA: Diagnosis not present

## 2020-12-16 DIAGNOSIS — M25561 Pain in right knee: Secondary | ICD-10-CM | POA: Diagnosis not present

## 2020-12-17 DIAGNOSIS — M25561 Pain in right knee: Secondary | ICD-10-CM | POA: Diagnosis not present

## 2020-12-17 DIAGNOSIS — M25361 Other instability, right knee: Secondary | ICD-10-CM | POA: Diagnosis not present

## 2020-12-24 DIAGNOSIS — M25561 Pain in right knee: Secondary | ICD-10-CM | POA: Diagnosis not present

## 2020-12-24 DIAGNOSIS — M25361 Other instability, right knee: Secondary | ICD-10-CM | POA: Diagnosis not present

## 2021-01-12 ENCOUNTER — Other Ambulatory Visit: Payer: Self-pay

## 2021-01-12 ENCOUNTER — Encounter (HOSPITAL_BASED_OUTPATIENT_CLINIC_OR_DEPARTMENT_OTHER): Payer: Self-pay | Admitting: Orthopedic Surgery

## 2021-01-12 DIAGNOSIS — S838X1A Sprain of other specified parts of right knee, initial encounter: Secondary | ICD-10-CM | POA: Diagnosis not present

## 2021-01-12 DIAGNOSIS — M25561 Pain in right knee: Secondary | ICD-10-CM | POA: Diagnosis not present

## 2021-01-12 NOTE — Progress Notes (Signed)
Spoke w/ via phone for pre-op interview--- PT Lab needs dos----  EKG             Lab results------ no COVID test ------ 01-13-2021 @ 1505 Arrive at ------- 1045 NPO after MN NO Solid Food.  Clear liquids from MN until--- 0945 Medications to take morning of surgery ----- Albuterol nebulizer Diabetic medication ----- n/a Patient Special Instructions ----- asked to bring symbicort and rescue inhaler's dos Pre-Op special Istructions ----- n/a Patient verbalized understanding of instructions that were given at this phone interview. Patient denies shortness of breath, chest pain, fever, cough at this phone interview.  Anesthesia : hx HTN;  Palpitations;  Moderate persistent Asthma;  Pt stated has not taken his toprol since 09/ 2021, he did not need it.  Pt stated only gets palpitations when lifting something heavy.  Also, pt said last asthma exacerbation approx. 05/ 2021, last used nebulizer last week for wheezing and last used symbicort/ rescue inhaler 3 months ago.  PCP:  Dr Rito Ehrlich (lov 07-30-2020 epic) Cardiologist :  Dr Allyson Sabal (lov 06-26-2019, pt was released) Chest x-ray : 03-03-2020 epic EKG : 06-26-2019 epic Echo :  No Event montior:  05-30-2018 epic Stress test: no Cardiac Cath :  no Activity level:  Denies sob w/ any activity Sleep Study/ CPAP :  NO Fasting Blood Sugar :      / Checks Blood Sugar -- times a day:   N/A Blood Thinner/ Instructions /Last Dose:  NO ASA / Instructions/ Last Dose :  NO

## 2021-01-13 ENCOUNTER — Other Ambulatory Visit (HOSPITAL_COMMUNITY)
Admission: RE | Admit: 2021-01-13 | Discharge: 2021-01-13 | Disposition: A | Discharge status: Home / Self Care | Payer: Federal, State, Local not specified - PPO | Source: Ambulatory Visit | Attending: Orthopedic Surgery | Admitting: Orthopedic Surgery

## 2021-01-13 DIAGNOSIS — Z01812 Encounter for preprocedural laboratory examination: Secondary | ICD-10-CM | POA: Diagnosis not present

## 2021-01-13 DIAGNOSIS — U071 COVID-19: Secondary | ICD-10-CM | POA: Insufficient documentation

## 2021-01-13 LAB — SARS CORONAVIRUS 2 (TAT 6-24 HRS): SARS Coronavirus 2: POSITIVE — AB

## 2021-01-14 ENCOUNTER — Encounter: Payer: Self-pay | Admitting: Adult Health

## 2021-01-14 NOTE — Progress Notes (Signed)
Patient tested positive for COVID left message for Dr. Aundria Rud surgery scheduler

## 2021-01-15 ENCOUNTER — Ambulatory Visit (HOSPITAL_BASED_OUTPATIENT_CLINIC_OR_DEPARTMENT_OTHER)
Admission: RE | Admit: 2021-01-15 | Payer: Federal, State, Local not specified - PPO | Source: Home / Self Care | Admitting: Orthopedic Surgery

## 2021-01-15 HISTORY — DX: Unspecified injury of right lower leg, initial encounter: S89.91XA

## 2021-01-15 HISTORY — DX: Moderate persistent asthma, uncomplicated: J45.40

## 2021-01-15 HISTORY — DX: Palpitations: R00.2

## 2021-01-15 HISTORY — DX: Personal history of other diseases of the circulatory system: Z86.79

## 2021-01-15 HISTORY — DX: Other allergy status, other than to drugs and biological substances: Z91.09

## 2021-01-15 SURGERY — REPAIR, TENDON, PATELLAR, ARTHROSCOPIC
Anesthesia: Choice | Site: Knee | Laterality: Right

## 2021-01-20 DIAGNOSIS — S838X1A Sprain of other specified parts of right knee, initial encounter: Secondary | ICD-10-CM | POA: Diagnosis not present

## 2021-01-20 DIAGNOSIS — M25561 Pain in right knee: Secondary | ICD-10-CM | POA: Diagnosis not present

## 2021-02-15 ENCOUNTER — Other Ambulatory Visit: Payer: Self-pay

## 2021-02-15 ENCOUNTER — Encounter (HOSPITAL_BASED_OUTPATIENT_CLINIC_OR_DEPARTMENT_OTHER): Payer: Self-pay | Admitting: Orthopedic Surgery

## 2021-02-15 NOTE — Progress Notes (Signed)
Spoke w/ via phone for pre-op interview--- PT Lab needs dos----  EKG             Lab results------ no COVID test ------ positive covid 01-13-2021 epic Arrive at ------- 1045 am 02-18-2021 NPO after MN NO Solid Food.  Clear liquids from MN until--- 0945 Medications to take morning of surgery ----- Albuterol nebulizer Diabetic medication ----- n/a Patient Special Instructions ----- asked to bring symbicort and rescue inhaler's dos Pre-Op special Istructions ----- n/a Patient verbalized understanding of instructions that were given at this phone interview. Patient denies shortness of breath, chest pain, fever, cough at this phone interview.  Anesthesia : hx HTN;  Palpitations;  Moderate persistent Asthma;  Pt stated has not taken his toprol since 09/ 2021, he did not need it.  Pt stated only gets palpitations when lifting something heavy.  Also, pt said last asthma exacerbation approx. 05/ 2021, last used nebulizer last week for wheezing and last used symbicort/ rescue inhaler 3 months ago.  PCP:  Dr Rito Ehrlich (lov 07-30-2020 epic) Cardiologist :  Dr Allyson Sabal (lov 06-26-2019, pt was released) Chest x-ray : 03-03-2020 epic EKG : 06-26-2019 epic Echo :  No Event montior:  05-30-2018 epic Stress test: no Cardiac Cath :  no Activity level:  Denies sob w/ any activity Sleep Study/ CPAP :  NO Fasting Blood Sugar :      / Checks Blood Sugar -- times a day:   N/A Blood Thinner/ Instructions /Last Dose:  NO ASA / Instructions/ Last Dose :  NO

## 2021-02-18 ENCOUNTER — Encounter (HOSPITAL_BASED_OUTPATIENT_CLINIC_OR_DEPARTMENT_OTHER): Payer: Self-pay | Admitting: Orthopedic Surgery

## 2021-02-18 ENCOUNTER — Ambulatory Visit (HOSPITAL_BASED_OUTPATIENT_CLINIC_OR_DEPARTMENT_OTHER)
Admission: RE | Admit: 2021-02-18 | Discharge: 2021-02-18 | Disposition: A | Discharge status: Home / Self Care | Payer: Federal, State, Local not specified - PPO | Attending: Orthopedic Surgery | Admitting: Orthopedic Surgery

## 2021-02-18 ENCOUNTER — Ambulatory Visit (HOSPITAL_COMMUNITY): Payer: Federal, State, Local not specified - PPO

## 2021-02-18 ENCOUNTER — Other Ambulatory Visit: Payer: Self-pay

## 2021-02-18 ENCOUNTER — Ambulatory Visit (HOSPITAL_BASED_OUTPATIENT_CLINIC_OR_DEPARTMENT_OTHER): Payer: Federal, State, Local not specified - PPO | Admitting: Anesthesiology

## 2021-02-18 ENCOUNTER — Encounter (HOSPITAL_BASED_OUTPATIENT_CLINIC_OR_DEPARTMENT_OTHER)
Admission: RE | Disposition: A | Discharge status: Home / Self Care | Payer: Self-pay | Source: Home / Self Care | Attending: Orthopedic Surgery

## 2021-02-18 ENCOUNTER — Telehealth: Payer: Self-pay | Admitting: Adult Health

## 2021-02-18 DIAGNOSIS — Z833 Family history of diabetes mellitus: Secondary | ICD-10-CM | POA: Insufficient documentation

## 2021-02-18 DIAGNOSIS — Z825 Family history of asthma and other chronic lower respiratory diseases: Secondary | ICD-10-CM | POA: Diagnosis not present

## 2021-02-18 DIAGNOSIS — M659 Synovitis and tenosynovitis, unspecified: Secondary | ICD-10-CM | POA: Diagnosis not present

## 2021-02-18 DIAGNOSIS — Y990 Civilian activity done for income or pay: Secondary | ICD-10-CM | POA: Diagnosis not present

## 2021-02-18 DIAGNOSIS — Z981 Arthrodesis status: Secondary | ICD-10-CM | POA: Diagnosis not present

## 2021-02-18 DIAGNOSIS — Z823 Family history of stroke: Secondary | ICD-10-CM | POA: Diagnosis not present

## 2021-02-18 DIAGNOSIS — W19XXXA Unspecified fall, initial encounter: Secondary | ICD-10-CM | POA: Insufficient documentation

## 2021-02-18 DIAGNOSIS — M2351 Chronic instability of knee, right knee: Secondary | ICD-10-CM | POA: Diagnosis not present

## 2021-02-18 DIAGNOSIS — Z79899 Other long term (current) drug therapy: Secondary | ICD-10-CM | POA: Diagnosis not present

## 2021-02-18 DIAGNOSIS — X58XXXA Exposure to other specified factors, initial encounter: Secondary | ICD-10-CM | POA: Insufficient documentation

## 2021-02-18 DIAGNOSIS — I10 Essential (primary) hypertension: Secondary | ICD-10-CM | POA: Diagnosis not present

## 2021-02-18 DIAGNOSIS — F17210 Nicotine dependence, cigarettes, uncomplicated: Secondary | ICD-10-CM | POA: Insufficient documentation

## 2021-02-18 DIAGNOSIS — Z791 Long term (current) use of non-steroidal anti-inflammatories (NSAID): Secondary | ICD-10-CM | POA: Insufficient documentation

## 2021-02-18 DIAGNOSIS — G8918 Other acute postprocedural pain: Secondary | ICD-10-CM | POA: Diagnosis not present

## 2021-02-18 DIAGNOSIS — Z7951 Long term (current) use of inhaled steroids: Secondary | ICD-10-CM | POA: Insufficient documentation

## 2021-02-18 DIAGNOSIS — Z8249 Family history of ischemic heart disease and other diseases of the circulatory system: Secondary | ICD-10-CM | POA: Insufficient documentation

## 2021-02-18 DIAGNOSIS — M25361 Other instability, right knee: Secondary | ICD-10-CM | POA: Diagnosis not present

## 2021-02-18 DIAGNOSIS — S76111A Strain of right quadriceps muscle, fascia and tendon, initial encounter: Secondary | ICD-10-CM | POA: Diagnosis not present

## 2021-02-18 DIAGNOSIS — M65861 Other synovitis and tenosynovitis, right lower leg: Secondary | ICD-10-CM | POA: Diagnosis not present

## 2021-02-18 DIAGNOSIS — Z419 Encounter for procedure for purposes other than remedying health state, unspecified: Secondary | ICD-10-CM

## 2021-02-18 DIAGNOSIS — M222X1 Patellofemoral disorders, right knee: Secondary | ICD-10-CM | POA: Diagnosis not present

## 2021-02-18 HISTORY — PX: KNEE ARTHROSCOPY WITH MEDIAL PATELLAR FEMORAL LIGAMENT RECONSTRUCTION: SHX5652

## 2021-02-18 SURGERY — REPAIR, TENDON, PATELLAR, ARTHROSCOPIC
Anesthesia: Regional | Site: Knee | Laterality: Right

## 2021-02-18 MED ORDER — HYDROMORPHONE HCL 1 MG/ML IJ SOLN
INTRAMUSCULAR | Status: AC
  Filled 2021-02-18: qty 1

## 2021-02-18 MED ORDER — OXYCODONE HCL 5 MG/5ML PO SOLN: 5.0000 mg | Freq: Once | ORAL | Status: AC | PRN

## 2021-02-18 MED ORDER — FENTANYL CITRATE (PF) 100 MCG/2ML IJ SOLN
50.0000 ug | Freq: Once | INTRAMUSCULAR | Status: AC
Start: 2021-02-18 — End: 2021-02-18
  Administered 2021-02-18: 50 ug via INTRAVENOUS

## 2021-02-18 MED ORDER — ACETAMINOPHEN 10 MG/ML IV SOLN
INTRAVENOUS | Status: AC
  Filled 2021-02-18: qty 100

## 2021-02-18 MED ORDER — SODIUM CHLORIDE 0.9 % IR SOLN
Status: DC | PRN
  Administered 2021-02-18: 6000 mL
  Administered 2021-02-18: 500 mL

## 2021-02-18 MED ORDER — LIDOCAINE 2% (20 MG/ML) 5 ML SYRINGE
INTRAMUSCULAR | Status: DC | PRN
  Administered 2021-02-18: 60 mg via INTRAVENOUS

## 2021-02-18 MED ORDER — FENTANYL CITRATE (PF) 100 MCG/2ML IJ SOLN
INTRAMUSCULAR | Status: DC | PRN
  Administered 2021-02-18 (×2): 50 ug via INTRAVENOUS
  Administered 2021-02-18 (×2): 25 ug via INTRAVENOUS
  Administered 2021-02-18: 50 ug via INTRAVENOUS

## 2021-02-18 MED ORDER — PROMETHAZINE HCL 25 MG/ML IJ SOLN: 6.2500 mg | INTRAMUSCULAR | Status: DC | PRN

## 2021-02-18 MED ORDER — ONDANSETRON HCL 4 MG/2ML IJ SOLN
INTRAMUSCULAR | Status: DC | PRN
  Administered 2021-02-18: 4 mg via INTRAVENOUS

## 2021-02-18 MED ORDER — HYDROMORPHONE HCL 1 MG/ML IJ SOLN
0.2500 mg | INTRAMUSCULAR | Status: DC | PRN
  Administered 2021-02-18 (×4): 0.5 mg via INTRAVENOUS

## 2021-02-18 MED ORDER — OXYCODONE HCL 5 MG PO TABS
5.0000 mg | ORAL_TABLET | Freq: Once | ORAL | Status: AC | PRN
  Administered 2021-02-18: 5 mg via ORAL

## 2021-02-18 MED ORDER — HYDROCODONE-ACETAMINOPHEN 7.5-325 MG PO TABS: 1.0000 | ORAL_TABLET | ORAL | 0 refills | Status: DC | PRN

## 2021-02-18 MED ORDER — FENTANYL CITRATE (PF) 100 MCG/2ML IJ SOLN
INTRAMUSCULAR | Status: AC
  Filled 2021-02-18: qty 2

## 2021-02-18 MED ORDER — CEFAZOLIN SODIUM-DEXTROSE 2-4 GM/100ML-% IV SOLN
INTRAVENOUS | Status: AC
  Filled 2021-02-18: qty 100

## 2021-02-18 MED ORDER — DEXAMETHASONE SODIUM PHOSPHATE 10 MG/ML IJ SOLN
INTRAMUSCULAR | Status: AC
  Filled 2021-02-18: qty 1

## 2021-02-18 MED ORDER — MIDAZOLAM HCL 2 MG/2ML IJ SOLN
INTRAMUSCULAR | Status: DC | PRN
  Administered 2021-02-18: 2 mg via INTRAVENOUS

## 2021-02-18 MED ORDER — EPHEDRINE 5 MG/ML INJ
INTRAVENOUS | Status: AC
  Filled 2021-02-18: qty 10

## 2021-02-18 MED ORDER — FENTANYL CITRATE (PF) 100 MCG/2ML IJ SOLN
50.0000 ug | Freq: Once | INTRAMUSCULAR | Status: AC
  Administered 2021-02-18: 50 ug via INTRAVENOUS

## 2021-02-18 MED ORDER — LACTATED RINGERS IV SOLN: INTRAVENOUS | Status: DC

## 2021-02-18 MED ORDER — MIDAZOLAM HCL 2 MG/2ML IJ SOLN
INTRAMUSCULAR | Status: AC
  Filled 2021-02-18: qty 2

## 2021-02-18 MED ORDER — CEFAZOLIN SODIUM-DEXTROSE 2-4 GM/100ML-% IV SOLN
2.0000 g | INTRAVENOUS | Status: AC
  Administered 2021-02-18: 2 g via INTRAVENOUS

## 2021-02-18 MED ORDER — PROPOFOL 10 MG/ML IV BOLUS
INTRAVENOUS | Status: DC | PRN
  Administered 2021-02-18: 160 mg via INTRAVENOUS

## 2021-02-18 MED ORDER — OXYCODONE HCL 5 MG PO TABS: 5.0000 mg | ORAL_TABLET | ORAL | 0 refills | Status: DC | PRN

## 2021-02-18 MED ORDER — ACETAMINOPHEN 10 MG/ML IV SOLN
1000.0000 mg | Freq: Once | INTRAVENOUS | Status: DC | PRN
  Administered 2021-02-18: 1000 mg via INTRAVENOUS

## 2021-02-18 MED ORDER — OXYCODONE HCL 5 MG PO TABS
ORAL_TABLET | ORAL | Status: AC
  Filled 2021-02-18: qty 1

## 2021-02-18 MED ORDER — DEXAMETHASONE SODIUM PHOSPHATE 10 MG/ML IJ SOLN
INTRAMUSCULAR | Status: DC | PRN
  Administered 2021-02-18 (×2): 5 mg

## 2021-02-18 MED ORDER — METHOCARBAMOL 1000 MG/10ML IJ SOLN
500.0000 mg | Freq: Once | INTRAVENOUS | Status: AC
  Administered 2021-02-18: 500 mg via INTRAVENOUS
  Filled 2021-02-18: qty 500

## 2021-02-18 MED ORDER — DEXAMETHASONE SODIUM PHOSPHATE 10 MG/ML IJ SOLN
INTRAMUSCULAR | Status: DC | PRN
  Administered 2021-02-18: 5 mg via INTRAVENOUS

## 2021-02-18 MED ORDER — ONDANSETRON HCL 4 MG/2ML IJ SOLN
INTRAMUSCULAR | Status: AC
  Filled 2021-02-18: qty 2

## 2021-02-18 MED ORDER — ONDANSETRON 4 MG PO TBDP: 4.0000 mg | ORAL_TABLET | Freq: Three times a day (TID) | ORAL | 0 refills | Status: DC | PRN

## 2021-02-18 MED ORDER — PROPOFOL 10 MG/ML IV BOLUS
INTRAVENOUS | Status: AC
  Filled 2021-02-18: qty 20

## 2021-02-18 MED ORDER — ROPIVACAINE HCL 7.5 MG/ML IJ SOLN
INTRAMUSCULAR | Status: DC | PRN
  Administered 2021-02-18: 20 mL via PERINEURAL

## 2021-02-18 MED ORDER — MIDAZOLAM HCL 2 MG/2ML IJ SOLN
2.0000 mg | Freq: Once | INTRAMUSCULAR | Status: AC
  Administered 2021-02-18: 2 mg via INTRAVENOUS

## 2021-02-18 SURGICAL SUPPLY — 85 items
ANCH SUT SWLK 19.1X6.25 CLS (Anchor) ×1 IMPLANT
ANCHOR SUT SWIVELLOK BIO (Anchor) ×1 IMPLANT
BANDAGE ESMARK 6X9 LF (GAUZE/BANDAGES/DRESSINGS) ×1 IMPLANT
BLADE SURG 10 STRL SS (BLADE) IMPLANT
BLADE SURG 15 STRL LF DISP TIS (BLADE) ×2 IMPLANT
BLADE SURG 15 STRL SS (BLADE) ×4
BNDG CMPR 9X6 STRL LF SNTH (GAUZE/BANDAGES/DRESSINGS) ×1
BNDG ELASTIC 6X5.8 VLCR STR LF (GAUZE/BANDAGES/DRESSINGS) ×2 IMPLANT
BNDG ESMARK 6X9 LF (GAUZE/BANDAGES/DRESSINGS) ×2
BNDG GAUZE ELAST 4 BULKY (GAUZE/BANDAGES/DRESSINGS) ×1 IMPLANT
BURR CLEARCUT OVAL 5.5X13 (MISCELLANEOUS) IMPLANT
BURR OVAL 12 FL 5.5X13 (MISCELLANEOUS)
COVER BACK TABLE 60X90IN (DRAPES) ×2 IMPLANT
COVER WAND RF STERILE (DRAPES) ×2 IMPLANT
CUFF TOURN SGL QUICK 34 (TOURNIQUET CUFF) ×2
CUFF TRNQT CYL 34X4.125X (TOURNIQUET CUFF) ×1 IMPLANT
DISSECTOR  3.8MM X 13CM (MISCELLANEOUS) ×2
DISSECTOR 3.8MM X 13CM (MISCELLANEOUS) ×1 IMPLANT
DRAPE ARTHROSCOPY W/POUCH 114 (DRAPES) ×2 IMPLANT
DRAPE C-ARM 42X120 X-RAY (DRAPES) ×2 IMPLANT
DRAPE INCISE IOBAN 66X45 STRL (DRAPES) IMPLANT
DRAPE SHEET LG 3/4 BI-LAMINATE (DRAPES) ×2 IMPLANT
DRAPE U-SHAPE 47X51 STRL (DRAPES) ×2 IMPLANT
DRSG PAD ABDOMINAL 8X10 ST (GAUZE/BANDAGES/DRESSINGS) ×3 IMPLANT
DURAPREP 26ML APPLICATOR (WOUND CARE) ×2 IMPLANT
ELECT REM PT RETURN 9FT ADLT (ELECTROSURGICAL) ×2
ELECTRODE REM PT RTRN 9FT ADLT (ELECTROSURGICAL) IMPLANT
FIBERSTICK 2 (SUTURE) IMPLANT
GAUZE SPONGE 4X4 12PLY STRL (GAUZE/BANDAGES/DRESSINGS) ×2 IMPLANT
GAUZE SPONGE 4X4 12PLY STRL LF (GAUZE/BANDAGES/DRESSINGS) ×1 IMPLANT
GAUZE XEROFORM 1X8 LF (GAUZE/BANDAGES/DRESSINGS) ×2 IMPLANT
GLOVE INDICATOR 8.0 STRL GRN (GLOVE) ×4 IMPLANT
GLOVE SURG ENC MOIS LTX SZ7.5 (GLOVE) ×4 IMPLANT
GOWN STRL REUS W/TWL LRG LVL3 (GOWN DISPOSABLE) ×4 IMPLANT
GRAFT ROPE FROZEN (Tissue) ×2 IMPLANT
GRAFT TISS ROPE SEMITEND 4-5.5 (Tissue) IMPLANT
IMMOBILIZER KNEE 22 UNIV (SOFTGOODS) IMPLANT
IV NS IRRIG 3000ML ARTHROMATIC (IV SOLUTION) ×4 IMPLANT
KIT BIO-TENODESIS 3X8 DISP (MISCELLANEOUS)
KIT INSRT BABSR STRL DISP BTN (MISCELLANEOUS) IMPLANT
KIT TRANSTIBIAL (DISPOSABLE) IMPLANT
KIT TURNOVER CYSTO (KITS) ×2 IMPLANT
MANIFOLD NEPTUNE II (INSTRUMENTS) ×2 IMPLANT
NDL MAYO CATGUT SZ4 TPR NDL (NEEDLE) ×1 IMPLANT
NEEDLE ELECTRODE (NEEDLE) IMPLANT
NEEDLE HYPO 22GX1.5 SAFETY (NEEDLE) ×2 IMPLANT
NEEDLE MAYO CATGUT SZ4 (NEEDLE) ×2 IMPLANT
NS IRRIG 1000ML POUR BTL (IV SOLUTION) ×2 IMPLANT
PACK ARTHROSCOPY DSU (CUSTOM PROCEDURE TRAY) ×2 IMPLANT
PACK BASIN DAY SURGERY FS (CUSTOM PROCEDURE TRAY) ×2 IMPLANT
PAD ARMBOARD 7.5X6 YLW CONV (MISCELLANEOUS) ×4 IMPLANT
PAD CAST 4YDX4 CTTN HI CHSV (CAST SUPPLIES) ×1 IMPLANT
PADDING CAST COTTON 4X4 STRL (CAST SUPPLIES) ×2
PADDING CAST COTTON 6X4 STRL (CAST SUPPLIES) ×4 IMPLANT
PASSER SUT SWANSON 36MM LOOP (INSTRUMENTS) ×2 IMPLANT
PENCIL SMOKE EVACUATOR (MISCELLANEOUS) ×1 IMPLANT
SPONGE LAP 4X18 RFD (DISPOSABLE) ×2 IMPLANT
STRIP CLOSURE SKIN 1/2X4 (GAUZE/BANDAGES/DRESSINGS) ×2 IMPLANT
SUCTION FRAZIER HANDLE 10FR (MISCELLANEOUS) ×2
SUCTION TUBE FRAZIER 10FR DISP (MISCELLANEOUS) ×1 IMPLANT
SUT 2 FIBERLOOP 20 STRT BLUE (SUTURE) ×4
SUT FIBERWIRE #2 38 REV NDL BL (SUTURE)
SUT FIBERWIRE #2 38 T-5 BLUE (SUTURE) ×2
SUT MON AB 2-0 CT1 36 (SUTURE) ×2 IMPLANT
SUT MON AB 3-0 SH 27 (SUTURE) ×2
SUT MON AB 3-0 SH27 (SUTURE) ×1 IMPLANT
SUT VIC AB 0 CT2 27 (SUTURE) IMPLANT
SUT VIC AB 2-0 CT2 27 (SUTURE) IMPLANT
SUT VIC AB 2-0 SH 27 (SUTURE)
SUT VIC AB 2-0 SH 27XBRD (SUTURE) IMPLANT
SUTURE 2 FIBERLOOP 20 STRT BLU (SUTURE) IMPLANT
SUTURE FIBERWR #2 38 T-5 BLUE (SUTURE) IMPLANT
SUTURE FIBERWR#2 38 REV NDL BL (SUTURE) IMPLANT
SUTURE TIGERSTICK 2 TIGERWIR 2 (MISCELLANEOUS) IMPLANT
SYR 20ML LL LF (SYRINGE) ×2 IMPLANT
SYR BULB IRRIG 60ML STRL (SYRINGE) ×2 IMPLANT
SYR CONTROL 10ML LL (SYRINGE) IMPLANT
SYS IMPL W/INTERFERENCE SCREW (Orthopedic Implant) ×2 IMPLANT
SYSTEM IMPL W/INTERFERENC SCRE (Orthopedic Implant) IMPLANT
TIGERSTICK 2 TIGERWIRE 2 (MISCELLANEOUS)
TOWEL OR 17X26 10 PK STRL BLUE (TOWEL DISPOSABLE) ×4 IMPLANT
TUBE CONNECTING 12X1/4 (SUCTIONS) ×2 IMPLANT
TUBING ARTHROSCOPY IRRIG 16FT (MISCELLANEOUS) ×2 IMPLANT
WATER STERILE IRR 500ML POUR (IV SOLUTION) ×2 IMPLANT
WRAP KNEE MAXI GEL POST OP (GAUZE/BANDAGES/DRESSINGS) ×1 IMPLANT

## 2021-02-18 NOTE — Telephone Encounter (Signed)
Patient wife is calling and stated that patient is having surgery today at the surgery center and needed to know the patients blood type, please advise. CB is [phone]

## 2021-02-18 NOTE — H&P (Signed)
ORTHOPAEDIC H and P  REQUESTING PHYSICIAN: Austin Kida, MD  PCP:  Shirline Frees, NP  Chief Complaint: Right patellofemoral instability  HPI: Austin Curry is a 40 y.o. 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  He is here today for stabilization procedure with medial patellofemoral ligament reconstruction.  He does tell me that in between his last visit with me and today he did have a fall at work.  There was some concern at the office visit from the fall that he did sustain a meniscus injury.  No other complaints.  Past Medical History:  Diagnosis Date  . Environmental allergies   . History of hypertension   . Injury of ligament of right knee    right medial patellofemoral ligament tear  . Intermittent palpitations 01-12-2021  per pt have been taking toprol since 09/ 2021 did not think he needed any more, only feel palpitations when lifting something heavy   previous seen by cardiology,  dr Allyson Sabal, lov 06-26-2019 pt was released prn basis;  event monitor 06-09-2018 showed ns/ st  . Moderate persistent asthma    pulmonology---  b. Cherre Huger  NP  (01-12-2021 per pt last exacerbation approxl. 05/ 2021)   Past Surgical History:  Procedure Laterality Date  . INSERTION OF MESH N/A 04/18/2016   Procedure: INSERTION OF MESH;  Surgeon: Abigail Miyamoto, MD;  Location: [facility];  Service: General;  Laterality: N/A;  . VENTRAL HERNIA REPAIR N/A 04/18/2016   Procedure:  VENTRAL HERNIA REPAIR;  Surgeon: Abigail Miyamoto, MD;  Location: [facility];  Service: General;  Laterality: N/A;   Social History   Socioeconomic History  . Marital status: Single    Spouse name: Not on file  . Number of children: Not on file  . Years of education: Not on file  . Highest education level: Not on file  Occupational History  . Occupation: Industrial/product designer: [employer]   Tobacco Use  . Smoking status: Current Every Day Smoker    Years: 15.00    Types: Cigarettes    Last attempt to quit: 12/09/2014    Years since quitting: 6.2  . Smokeless tobacco: Never Used  . Tobacco comment: 01-12-2021 per pt 2 cig per day  Vaping Use  . Vaping Use: Never used  Substance and Sexual Activity  . Alcohol use: Not Currently    Alcohol/week: 0.0 standard drinks    Comment: occasional  . Drug use: Never  . Sexual activity: Not on file  Other Topics Concern  . Not on file  Social History Narrative   Married    No children       Social Determinants of Health   Financial Resource Strain: Not on file  Food Insecurity: Not on file  Transportation Needs: Not on file  Physical Activity: Not on file  Stress: Not on file  Social Connections: Not on file   Family History  Problem Relation Age of Onset  . Diabetes Mother   . Hypertension Mother   . CVA Mother   . Hypercholesterolemia Mother   . Hyperlipidemia Father   . Asthma Brother   . Asthma Brother    No Known Allergies Prior to Admission medications   Medication Sig Start Date End Date Taking? Authorizing Provider  acetaminophen (TYLENOL) 500 MG tablet Take 500 mg by mouth every 6 (six) hours as needed.  Yes [provider]  albuterol (PROVENTIL) (2.5 MG/3ML) 0.083% nebulizer solution Take 3 mLs (2.5 mg total) by nebulization every 4 (four) hours as needed for wheezing or shortness of breath. 03/03/20  Yes Coral Ceo, NP  albuterol (VENTOLIN HFA) 108 (90 Base) MCG/ACT inhaler TAKE 2 PUFFS BY MOUTH EVERY 6 HOURS AS NEEDED FOR WHEEZE OR SHORTNESS OF BREATH 05/18/20  Yes Coral Ceo, NP  budesonide-formoterol (SYMBICORT) 80-4.5 MCG/ACT inhaler 2 puffs in the morning AS NEEDED right when you wake up, rinse out your mouth after use, Can repeat in 12 hours later 2 puffs AS NEEDED, rinse after use 03/03/20  Yes Coral Ceo, NP  ibuprofen (ADVIL) 200 MG tablet Take 200 mg by mouth every 6 (six) hours as  needed.   Yes [provider]  cetirizine (ZYRTEC) 10 MG tablet Take 10 mg by mouth daily as needed.    [provider]  metoprolol succinate (TOPROL XL) 25 MG 24 hr tablet Take 1 tablet (25 mg total) by mouth daily. Patient not taking: No sig reported 06/22/18   Runell Gess, MD  Multiple Vitamins-Minerals (CENTRUM MEN PO) Take by mouth daily. gummy    [provider]   No results found.  Positive ROS: All other systems have been reviewed and were otherwise negative with the exception of those mentioned in the HPI and as above.  Physical Exam: General: Alert, no acute distress Cardiovascular: No pedal edema Respiratory: No cyanosis, no use of accessory musculature GI: No organomegaly, abdomen is soft and non-tender Skin: No lesions in the area of chief complaint Neurologic: Sensation intact distally Psychiatric: Patient is competent for consent with normal mood and affect Lymphatic: No axillary or cervical lymphadenopathy  MUSCULOSKELETAL:  Right lower extremity is warm and well-perfused with no open wounds.  Neurovascular intact.  Assessment: Right patellofemoral chronic instability.  Plan: -Plan to proceed today with medial patellofemoral ligament reconstruction with hamstring allograft.  We again discussed the procedure in detail with [name] and his wife.  All questions were solicited and answered to his satisfaction.  -He will be discharged home postoperatively from PACU with partial weightbearing restrictions in his Bledsoe brace.    Austin Kida, MD Cell [phone]    02/18/2021 12:33 PM

## 2021-02-18 NOTE — Progress Notes (Signed)
AssistedDr. Greg Stoltzfus with right, ultrasound guided, adductor canal block. Side rails up, monitors on throughout procedure. See vital signs in flow sheet. Tolerated Procedure well.  

## 2021-02-18 NOTE — Op Note (Signed)
02/18/2021  2:10 PM  PATIENT:  Austin Curry    PRE-OPERATIVE DIAGNOSIS: Right Patellar instability with medial patellofemoral ligament disruption  POST-OPERATIVE DIAGNOSIS:   1. Right Patellar instability with medial patellofemoral ligament disruption 2.  Right knee anterior synovitis  PROCEDURE:   1.  Right knee arthroscopy with limited synovectomy of anterior compartment  2.  Right knee arthroscopic assisted medial patellofemoral ligament reconstruction with hamstring allograft.  Implants:  Arthrex 3.9 mm swivel lock anchors x 2 Arthrex 6 mm biocomposite interference screw x 1  Allograft hamstring graft 4.5 mm x 230 mm  SURGEON:  Yolonda Kida, MD   Assistant: Dion Saucier, PA-C  Assistant attestation: PA Sharon Seller was utilized throughout the procedure for positioning patient, preparation of allograft, manipulation of the knee and implantation of allograft as well as closure of wounds.  He was present for the entire procedure.  Tourniquet:   250 mmHg at the right thigh for less than 1 hour.  ANESTHESIA:   General  ESTIMATED BLOOD LOSS: 5 cc  PREOPERATIVE INDICATIONS:  Austin Curry is a  41 y.o. male with a diagnosis of patellar dislocation and medial patellofemoral ligament disruption who failed conservative measures and elected for surgical management.    The risks benefits and alternatives were discussed with the patient preoperatively including but not limited to the risks of infection, bleeding, nerve injury, cardiopulmonary complications, the need for revision surgery, stiffness, posttraumatic arthritis, among others, and the patient was willing to proceed.  OPERATIVE FINDINGS: There was grade 1 chondromalacia undersurface of the patella. The femoral trochlea was extremely shallow. The medial and lateral compartments were normal and there were no meniscal tears. The anterior cruciate ligament and PCL were intact. He had substantial patellar subluxation  and tilt prior to repair.  There was also noted to be marked anterior synovitis with a large ligamentum mucosum noted in the notch.  On the medial aspect of the knee there was some traumatic-looking synovitis with inflammation and friction along the medial femoral condyle.    Postoperatively He had appropriate tracking, despite the shallow trochlea, and He tracked centrally.  OPERATIVE PROCEDURE: The patient was brought to the operating room placed in the supine position. IV antibiotics were given. General anesthesia was administered. The lower extremity was prepped and draped in usual sterile fashion. Time out was performed. The leg was elevated exsanguinated and tourniquet was inflated. Diagnostic arthroscopy was carried out with the above-named findings. I switched portals to evaluate the tracking of the patella viewing from the medial portal, and also completed my chondroplasty this way.  We then performed arthroscopic limited synovectomy with motorized shaver removing the traumatic and inflamed synovium from the anterior compartment while viewing from the lateral portal and working from medial portal.  We then switched our orientation viewed medially and worked laterally to complete the anterior synovectomy.  I then removed the arthroscopic instruments, and made an incision proximal to the patella down to the proximal one third of the patella through the skin. I then elevated the fascial layer of the quadriceps investment, and mobilized this. I then made an incision through the deep capsular layer including the MPL.  I next established to point of fixation form of a ligament reconstruction.  The superior site was determined proximally 2 cm inferior to the superior pole of the patella.  This would allow for adequate bony corridor to place the 19 mm anchor.  Another site for the second anchor was established 15 mm inferior to the  first anchor.  2 drill pins were placed in the predetermined locations for  the anchors.  These were placed in the anterior half of the patella.  Care was taken to avoid penetration of the cartilage surface.  After the guide pins were drilled in a parallel fashion 15 mm apart, we then overdrilled with the cannulated reamer to a depth of 20 mm.  Next, the hamstring allograft that had been previously prepared via whipstitch of 20 mm on the inside of the graft was brought into the operative field.  Each of the 2 whipstitch ends were loaded into a 3.9 mm bio composite swivel lock anchor.  The anchors were placed in a similar fashion first by tapping the eyelet into the pilot hole.  The allograft and eyelet were both advanced into the pilot hole, and then the anchor was screwed into place.  Both the superior and inferior anchors had good purchase.  I next tested the integrity of that purchase by manually pulling on the graft and it did have excellent purchase.    We next moved to the femoral sided fixation.  We established a layer to pass our graft between the vastus medialis obliquos and the capsule of the knee.  This would ensure that our ligament was passed in an extra capsular fashion.  Once that plane was established with tonsil dissection we then used fluoroscopy to locate the femoral attachment of the medial patellofemoral ligament.  This was located by finding the sulcus between the abductor tubercle and the medial epicondyle.  On radiographic imaging, utilizing the perfect lateral of the distal femur, we found the point just distal to the posterior aspect of the medial femoral condyle, and just anterior to the continuation of the posterior femoral cortex.  Once that point was confirmed on lateral view we then drilled the guidepin for the femoral anchor.  This guidepin was drilled across the femur and exited the lateral cortex and lateral skin.  A nitinol wire was placed adjacent to that pin.  We next utilized the reamer to open the femoral socket to receive the graft and composite  tenodesis screw.  Now utilizing a passing suture through the previous established layer just deep to the VMO we passed the allograft.  The allograft was then pulled into the femoral socket utilizing the previously drilled Beath pin.  Care was taken not to over tension the patella and the knee was flexed to approximately 30.  In this position the lateral aspect of the patella was ensured to be at the lateral aspect of the trochlea.  Once that was confirmed we then advanced to the 6 mm interference screw into the procedural femoral socket.  This had excellent purchase.  The patella was then reexamined through dynamic examination and was found to not be able to dislocate as it had previously been.  Finally, we utilized the free sutures from the patellar swivel lock anchors to close the capsular layer over the medial border of the patella.  This was done in a horizontal mattress fashion and in a pants over vest manner. I then repaired the layer in involving the vastus medialis, using a pants over vest repair with 0 Vicryl. This provided excellent augmentation to the repair. I then inserted the scope through the medial portal again, and confirm that I had appropriate translation and correction of patellar tilt.    After irrigating was copiously and repaired the tissue with Vicryl, 2-0 Monocryl for the subcutaneous layer, and 3-0 Monocryl for a  subcuticular running closure of the skin.  The skin with Steri-Strips and sterile gauze. He received a postoperative block. The incisions were injected with quarter percent plain Marcaine.  Sterile dressings were applied. The tourniquet was released after 54 minutes.  He was awakened and returned to the PACU in stable and satisfactory condition. There were no complications.  Disposition:  The patient will be partial weightbearing to the operative extremity until cleared by physical therapy for full weightbearing once her quadriceps has returned to normal function.  The  operative extremity will be locked in full extension in a hinged knee brace.  They will begin physical therapy in 1 week.  They will take twice daily aspirin for 6 weeks for prevention of blood clots.  I will see them back in the office in 2 weeks for wound check

## 2021-02-18 NOTE — Anesthesia Procedure Notes (Signed)
Anesthesia Regional Block: Adductor canal block   Pre-Anesthetic Checklist: ,, timeout performed, Correct Patient, Correct Site, Correct Laterality, Correct Procedure, Correct Position, site marked, Risks and benefits discussed,  Surgical consent,  Pre-op evaluation,  At surgeon's request and post-op pain management  Laterality: Right  Prep: Dura Prep       Needles:  Injection technique: Single-shot  Needle Type: Echogenic Stimulator Needle     Needle Length: 9cm  Needle Gauge: 20     Additional Needles:   Procedures:,,,, ultrasound used (permanent image in chart),,,,  Narrative:  Start time: 02/18/2021 12:06 PM End time: 02/18/2021 12:09 PM Injection made incrementally with aspirations every 5 mL.  Performed by: Personally  Anesthesiologist: Atilano Median, DO  Additional Notes: Patient identified. Risks/Benefits/Options discussed with patient including but not limited to bleeding, infection, nerve damage, failed block, incomplete pain control. Patient expressed understanding and wished to proceed. All questions were answered. Sterile technique was used throughout the entire procedure. Please see nursing notes for vital signs. Aspirated in 5cc intervals with injection for negative confirmation. Patient was given instructions on fall risk and not to get out of bed. All questions and concerns addressed with instructions to call with any issues or inadequate analgesia.

## 2021-02-18 NOTE — Transfer of Care (Signed)
Immediate Anesthesia Transfer of Care Note  Patient: Austin Curry  Procedure(s) Performed: Procedure(s) (LRB): RIGHT KNEE ARTHROSCOPYLIMITED SYNOVECTOMY WITH MEDIAL PATELLAR FEMORAL LIGAMENT RECONSTRUCTION WITH HAMSTRING ALLOGRAFT (Right)  Patient Location: PACU  Anesthesia Type: General  Level of Consciousness: awake, oriented, sedated and patient cooperative  Airway & Oxygen Therapy: Patient Spontanous Breathing and Patient connected to face mask oxygen  Post-op Assessment: Report given to PACU RN and Post -op Vital signs reviewed and stable  Post vital signs: Reviewed and stable  Complications: No apparent anesthesia complications Last Vitals:  Vitals Value Taken Time  BP 121/71 02/18/21 1440  Temp    Pulse 66 02/18/21 1442  Resp 12 02/18/21 1442  SpO2 98 % 02/18/21 1442  Vitals shown include unvalidated device data.  Last Pain:  Vitals:   02/18/21 1128  TempSrc: Oral  PainSc: 6          Complications: No complications documented.

## 2021-02-18 NOTE — Anesthesia Postprocedure Evaluation (Signed)
Anesthesia Post Note  Patient: Austin Curry  Procedure(s) Performed: RIGHT KNEE ARTHROSCOPYLIMITED SYNOVECTOMY WITH MEDIAL PATELLAR FEMORAL LIGAMENT RECONSTRUCTION WITH HAMSTRING ALLOGRAFT (Right Knee)     Patient location during evaluation: PACU Anesthesia Type: Regional and General Level of consciousness: awake and alert Pain management: pain level controlled Vital Signs Assessment: post-procedure vital signs reviewed and stable Respiratory status: spontaneous breathing, nonlabored ventilation, respiratory function stable and patient connected to nasal cannula oxygen Cardiovascular status: blood pressure returned to baseline and stable Postop Assessment: no apparent nausea or vomiting Anesthetic complications: no   No complications documented.  Last Vitals:  Vitals:   02/18/21 1530 02/18/21 1545  BP: 117/75 121/79  Pulse: 71 69  Resp: 10 11  Temp:    SpO2: 93% 93%    Last Pain:  Vitals:   02/18/21 1545  TempSrc:   PainSc: 6                  Woodward Klem P Hadlyn Amero

## 2021-02-18 NOTE — Anesthesia Preprocedure Evaluation (Addendum)
Anesthesia Evaluation  Patient identified by MRN, date of birth, ID band Patient awake    Reviewed: Allergy & Precautions, NPO status , Patient's Chart, lab work & pertinent test results  Airway Mallampati: II  TM Distance: >3 FB Neck ROM: Full    Dental  (+) Teeth Intact   Pulmonary asthma , Current Smoker and Patient abstained from smoking.,    Pulmonary exam normal        Cardiovascular hypertension, Pt. on medications and Pt. on home beta blockers  Rhythm:Regular Rate:Normal     Neuro/Psych negative neurological ROS  negative psych ROS   GI/Hepatic negative GI ROS,   Endo/Other  negative endocrine ROS  Renal/GU negative Renal ROS  negative genitourinary   Musculoskeletal Right knee patellofemoral instability   Abdominal (+)  Abdomen: soft. Bowel sounds: normal.  Peds  Hematology negative hematology ROS (+)   Anesthesia Other Findings   Reproductive/Obstetrics                            Anesthesia Physical Anesthesia Plan  ASA: II  Anesthesia Plan: General and Regional   Post-op Pain Management:  Regional for Post-op pain   Induction: Intravenous  PONV Risk Score and Plan: 1 and Ondansetron, Dexamethasone, Midazolam and Treatment may vary due to age or medical condition  Airway Management Planned: LMA and Mask  Additional Equipment: None  Intra-op Plan:   Post-operative Plan: Extubation in OR  Informed Consent: I have reviewed the patients History and Physical, chart, labs and discussed the procedure including the risks, benefits and alternatives for the proposed anesthesia with the patient or authorized representative who has indicated his/her understanding and acceptance.     Dental advisory given  Plan Discussed with: CRNA  Anesthesia Plan Comments:         Anesthesia Quick Evaluation

## 2021-02-18 NOTE — Telephone Encounter (Signed)
[place] to [place] and advised that we do not have his blood type.  Informed him that he could call a surgeons office or check his card if he has given blood.  Did inform the pt that his wife is not on the [facility]  He would like to add her.  Advised there is some paper work he will need to fill out when he comes in for his next appointment.  Nothing further needed.

## 2021-02-18 NOTE — Progress Notes (Signed)
Orthopedic Tech Progress Note Patient Details:  Austin Curry 05/27/1981 [id number]  Patient ID: Austin Curry, male   DOB: 1981/11/26, 40 y.o.   MRN: [id number]   Austin Curry 02/18/2021, 1:31 PM Called [employer] for Bledsoe brace.

## 2021-02-18 NOTE — Discharge Instructions (Signed)
Orthopedic discharge instructions:  -You are appropriate for partial weightbearing to the right lower extremity.  You should use crutches for assistance.  -He will maintain your postoperative brace in full extension until this is opened up by your physical therapist.  You should also sleep with this brace on.  You may remove the brace to shower.  You should remove postoperative bandages on postoperative day #3 and begin showering at that time.  Should then cover your incisions with daily dry dressings.  The Steri-Strips will remain in place and should not be removed.  -For pain and swelling you should apply ice to the right knee for 20 to 30 minutes out of each hour that you are awake.  She do this around-the-clock.  -For mild to moderate pain you should take Tylenol and Advil around-the-clock every 6 hours respectively.  You should alternate these 2 sites that you are getting 1 or the other every 3 hours.  For breakthrough pain use oxycodone as necessary.   Post Anesthesia Home Care Instructions  Activity: Get plenty of rest for the remainder of the day. A responsible adult should stay with you for 24 hours following the procedure.  For the next 24 hours, DO NOT: -Drive a car -Advertising copywriter -Drink alcoholic beverages -Take any medication unless instructed by your physician -Make any legal decisions or sign important papers.  Meals: Start with liquid foods such as gelatin or soup. Progress to regular foods as tolerated. Avoid greasy, spicy, heavy foods. If nausea and/or vomiting occur, drink only clear liquids until the nausea and/or vomiting subsides. Call your physician if vomiting continues.  Special Instructions/Symptoms: Your throat may feel dry or sore from the anesthesia or the breathing tube placed in your throat during surgery. If this causes discomfort, gargle with warm salt water. The discomfort should disappear within 24 hours.  If you had a scopolamine patch placed behind  your ear for the management of post- operative nausea and/or vomiting:  1. The medication in the patch is effective for 72 hours, after which it should be removed.  Wrap patch in a tissue and discard in the trash. Wash hands thoroughly with soap and water. 2. You may remove the patch earlier than 72 hours if you experience unpleasant side effects which may include dry mouth, dizziness or visual disturbances. 3. Avoid touching the patch. Wash your hands with soap and water after contact with the patch.   Crutch Mobility: Partial Weight Bearing    Grasp crutch handly. Do NOT lean armpits on crutches. 1.Move both crutches forward and slightly out to sides. 2.Step up to crutches with right leg. 3.Bearing weight on both hands, bring other leg forward. Repeat above sequence for each step taken.  Copyright  VHI. All rights reserved. es securel  -For the prevention of blood clots you will take an 81 mg aspirin once per day for 6 weeks.  -We will follow up with Dr. Aundria Rud in the office in 2 weeks.  Regional Anesthesia Blocks  1. Numbness or the inability to move the "blocked" extremity may last from 3-48 hours after placement. The length of time depends on the medication injected and your individual response to the medication. If the numbness is not going away after 48 hours, call your surgeon.  2. The extremity that is blocked will need to be protected until the numbness is gone and the  Strength has returned. Because you cannot feel it, you will need to take extra care to avoid injury. Because it  may be weak, you may have difficulty moving it or using it. You may not know what position it is in without looking at it while the block is in effect.  3. For blocks in the legs and feet, returning to weight bearing and walking needs to be done carefully. You will need to wait until the numbness is entirely gone and the strength has returned. You should be able to move your leg and foot normally  before you try and bear weight or walk. You will need someone to be with you when you first try to ensure you do not fall and possibly risk injury.  4. Bruising and tenderness at the needle site are common side effects and will resolve in a few days.  5. Persistent numbness or new problems with movement should be communicated to the surgeon or the [facility] [phone] [facility] [phone]).

## 2021-02-18 NOTE — Brief Op Note (Signed)
02/18/2021  2:04 PM  PATIENT:  Austin Curry  40 y.o. male  PRE-OPERATIVE DIAGNOSIS:  Right knee patellofemoral instability  POST-OPERATIVE DIAGNOSIS:  Right knee patellofemoral instability  PROCEDURE:  Procedure(s) with comments: RIGHT KNEE ARTHROSCOPYLIMITED SYNOVECTOMY WITH MEDIAL PATELLAR FEMORAL LIGAMENT RECONSTRUCTION WITH HAMSTRING ALLOGRAFT (Right) - 2 HRS  SURGEON:  Surgeon(s) and Role:    * Yolonda Kida, MD - Primary  PHYSICIAN ASSISTANT: Dion Saucier, PA-C   ANESTHESIA:   regional and general  EBL:  5 cc  BLOOD ADMINISTERED:none  DRAINS: none   LOCAL MEDICATIONS USED:  NONE  SPECIMEN:  No Specimen  DISPOSITION OF SPECIMEN:  N/A  COUNTS:  YES  TOURNIQUET:  * Missing tourniquet times found for documented tourniquets in log: [id number] *  DICTATION: .Note written in EPIC  PLAN OF CARE: Discharge home from PACU  PATIENT DISPOSITION:  PACU - hemodynamically stable.   Delay start of Pharmacological VTE agent (>24hrs) due to surgical blood loss or risk of bleeding: not applicable

## 2021-02-18 NOTE — Anesthesia Procedure Notes (Signed)
Procedure Name: LMA Insertion Date/Time: 02/18/2021 12:44 PM Performed by: Francie Massing, CRNA Pre-anesthesia Checklist: Patient identified, Emergency Drugs available, Suction available and Patient being monitored Patient Re-evaluated:Patient Re-evaluated prior to induction Oxygen Delivery Method: Circle system utilized Preoxygenation: Pre-oxygenation with 100% oxygen Induction Type: IV induction Ventilation: Mask ventilation without difficulty LMA: LMA inserted LMA Size: 4.0 Number of attempts: 1 Airway Equipment and Method: Bite block Placement Confirmation: positive ETCO2 Tube secured with: Tape Dental Injury: Teeth and Oropharynx as per pre-operative assessment

## 2021-02-22 ENCOUNTER — Encounter (HOSPITAL_BASED_OUTPATIENT_CLINIC_OR_DEPARTMENT_OTHER): Payer: Self-pay | Admitting: Orthopedic Surgery

## 2021-03-19 DIAGNOSIS — M25561 Pain in right knee: Secondary | ICD-10-CM | POA: Diagnosis not present

## 2021-03-23 DIAGNOSIS — M25561 Pain in right knee: Secondary | ICD-10-CM | POA: Diagnosis not present

## 2021-03-25 DIAGNOSIS — M25561 Pain in right knee: Secondary | ICD-10-CM | POA: Diagnosis not present

## 2021-03-31 DIAGNOSIS — M25561 Pain in right knee: Secondary | ICD-10-CM | POA: Diagnosis not present

## 2021-04-02 DIAGNOSIS — M25561 Pain in right knee: Secondary | ICD-10-CM | POA: Diagnosis not present

## 2021-04-05 DIAGNOSIS — M25561 Pain in right knee: Secondary | ICD-10-CM | POA: Diagnosis not present

## 2021-04-09 DIAGNOSIS — M25561 Pain in right knee: Secondary | ICD-10-CM | POA: Diagnosis not present

## 2021-04-12 DIAGNOSIS — M25561 Pain in right knee: Secondary | ICD-10-CM | POA: Diagnosis not present

## 2021-04-15 DIAGNOSIS — M25561 Pain in right knee: Secondary | ICD-10-CM | POA: Diagnosis not present

## 2021-04-21 DIAGNOSIS — M25561 Pain in right knee: Secondary | ICD-10-CM | POA: Diagnosis not present

## 2021-04-23 DIAGNOSIS — M25561 Pain in right knee: Secondary | ICD-10-CM | POA: Diagnosis not present

## 2021-04-27 DIAGNOSIS — M25561 Pain in right knee: Secondary | ICD-10-CM | POA: Diagnosis not present

## 2021-04-30 DIAGNOSIS — M25561 Pain in right knee: Secondary | ICD-10-CM | POA: Diagnosis not present

## 2021-05-04 DIAGNOSIS — M25561 Pain in right knee: Secondary | ICD-10-CM | POA: Diagnosis not present

## 2021-05-06 DIAGNOSIS — M25561 Pain in right knee: Secondary | ICD-10-CM | POA: Diagnosis not present

## 2021-05-28 DIAGNOSIS — M25561 Pain in right knee: Secondary | ICD-10-CM | POA: Diagnosis not present

## 2021-06-08 DIAGNOSIS — M25561 Pain in right knee: Secondary | ICD-10-CM | POA: Diagnosis not present

## 2021-06-16 DIAGNOSIS — M25561 Pain in right knee: Secondary | ICD-10-CM | POA: Diagnosis not present

## 2021-07-01 DIAGNOSIS — M25561 Pain in right knee: Secondary | ICD-10-CM | POA: Diagnosis not present

## 2021-07-09 DIAGNOSIS — M25561 Pain in right knee: Secondary | ICD-10-CM | POA: Diagnosis not present

## 2021-07-10 ENCOUNTER — Other Ambulatory Visit: Payer: Self-pay

## 2021-07-10 ENCOUNTER — Emergency Department (HOSPITAL_BASED_OUTPATIENT_CLINIC_OR_DEPARTMENT_OTHER)
Admission: EM | Admit: 2021-07-10 | Discharge: 2021-07-11 | Disposition: A | Discharge status: Home / Self Care | Payer: Federal, State, Local not specified - PPO | Attending: Emergency Medicine | Admitting: Emergency Medicine

## 2021-07-10 ENCOUNTER — Encounter (HOSPITAL_BASED_OUTPATIENT_CLINIC_OR_DEPARTMENT_OTHER): Payer: Self-pay | Admitting: Emergency Medicine

## 2021-07-10 DIAGNOSIS — J4540 Moderate persistent asthma, uncomplicated: Secondary | ICD-10-CM | POA: Diagnosis not present

## 2021-07-10 DIAGNOSIS — Z87891 Personal history of nicotine dependence: Secondary | ICD-10-CM | POA: Diagnosis not present

## 2021-07-10 DIAGNOSIS — Z79899 Other long term (current) drug therapy: Secondary | ICD-10-CM | POA: Diagnosis not present

## 2021-07-10 DIAGNOSIS — G44019 Episodic cluster headache, not intractable: Secondary | ICD-10-CM | POA: Diagnosis not present

## 2021-07-10 DIAGNOSIS — G44009 Cluster headache syndrome, unspecified, not intractable: Secondary | ICD-10-CM | POA: Diagnosis not present

## 2021-07-10 DIAGNOSIS — R519 Headache, unspecified: Secondary | ICD-10-CM | POA: Diagnosis not present

## 2021-07-10 DIAGNOSIS — I10 Essential (primary) hypertension: Secondary | ICD-10-CM | POA: Insufficient documentation

## 2021-07-10 MED ORDER — DIPHENHYDRAMINE HCL 50 MG/ML IJ SOLN
25.0000 mg | Freq: Once | INTRAMUSCULAR | Status: AC
  Administered 2021-07-11: 25 mg via INTRAVENOUS
  Filled 2021-07-10: qty 1

## 2021-07-10 MED ORDER — PROCHLORPERAZINE EDISYLATE 10 MG/2ML IJ SOLN
10.0000 mg | Freq: Once | INTRAMUSCULAR | Status: AC
  Administered 2021-07-11: 10 mg via INTRAVENOUS
  Filled 2021-07-10: qty 2

## 2021-07-10 MED ORDER — KETOROLAC TROMETHAMINE 30 MG/ML IJ SOLN
30.0000 mg | Freq: Once | INTRAMUSCULAR | Status: AC
  Administered 2021-07-11: 30 mg via INTRAVENOUS
  Filled 2021-07-10: qty 1

## 2021-07-10 MED ORDER — SODIUM CHLORIDE 0.9 % IV BOLUS
1000.0000 mL | Freq: Once | INTRAVENOUS | Status: AC
  Administered 2021-07-11: 1000 mL via INTRAVENOUS

## 2021-07-10 NOTE — ED Provider Notes (Signed)
[facility] EMERGENCY DEPARTMENT Provider Note   CSN: [id number] Arrival date & time: 07/10/21  2150     History Chief Complaint  Patient presents with   Headache    Austin Curry is a 40 y.o. male.  Patient presents to the emergency department for evaluation of right-sided headache.  Patient reports a history of cluster headaches.  He is currently experiencing a headache similar to his previous headaches.  Pain has progressively worsened over the course of today, throbbing in nature.  He has light sensitivity, sound sensitivity, no nausea or vomiting.  No fever, neck pain.      Past Medical History:  Diagnosis Date   Environmental allergies    History of hypertension    Injury of ligament of right knee    right medial patellofemoral ligament tear   Intermittent palpitations 01-12-2021  per pt have been taking toprol since 09/ 2021 did not think he needed any more, only feel palpitations when lifting something heavy   previous seen by cardiology,  dr Allyson Sabal, lov 06-26-2019 pt was released prn basis;  event monitor 06-09-2018 showed ns/ st   Moderate persistent asthma    pulmonology---  b. Cherre Huger  NP  (01-12-2021 per pt last exacerbation approxl. 05/ 2021)    Patient Active Problem List   Diagnosis Date Noted   History of environmental allergies 03/03/2020   Essential hypertension 06/22/2018   Palpitations 05/30/2018   Right shoulder pain 11/23/2017   Asthma exacerbation 04/08/2016   Ventral hernia 01/12/2016   Asthma, chronic 06/25/2013    Past Surgical History:  Procedure Laterality Date   INSERTION OF MESH N/A 04/18/2016   Procedure: INSERTION OF MESH;  Surgeon: Abigail Miyamoto, MD;  Location: [facility];  Service: General;  Laterality: N/A;   KNEE ARTHROSCOPY WITH MEDIAL PATELLAR FEMORAL LIGAMENT RECONSTRUCTION Right 02/18/2021   Procedure: RIGHT KNEE ARTHROSCOPYLIMITED SYNOVECTOMY WITH MEDIAL PATELLAR FEMORAL LIGAMENT RECONSTRUCTION WITH HAMSTRING ALLOGRAFT;   Surgeon: Yolonda Kida, MD;  Location: [facility];  Service: Orthopedics;  Laterality: Right;  2 HRS   VENTRAL HERNIA REPAIR N/A 04/18/2016   Procedure:  VENTRAL HERNIA REPAIR;  Surgeon: Abigail Miyamoto, MD;  Location: [facility];  Service: General;  Laterality: N/A;       Family History  Problem Relation Age of Onset   Diabetes Mother    Hypertension Mother    CVA Mother    Hypercholesterolemia Mother    Hyperlipidemia Father    Asthma Brother    Asthma Brother     Social History   Tobacco Use   Smoking status: Former    Years: 15.00    Types: Cigarettes    Quit date: 12/09/2014    Years since quitting: 6.5   Smokeless tobacco: Never   Tobacco comments:    01-12-2021 per pt 2 cig per day  Vaping Use   Vaping Use: Never used  Substance Use Topics   Alcohol use: Not Currently    Alcohol/week: 0.0 standard drinks    Comment: occasional   Drug use: Never    Home Medications Prior to Admission medications   Medication Sig Start Date End Date Taking? Authorizing Provider  acetaminophen (TYLENOL) 500 MG tablet Take 500 mg by mouth every 6 (six) hours as needed.    [provider]  albuterol (PROVENTIL) (2.5 MG/3ML) 0.083% nebulizer solution Take 3 mLs (2.5 mg total) by nebulization every 4 (four) hours as needed for wheezing or shortness of breath. 03/03/20   Elisha Headland  P, NP  albuterol (VENTOLIN HFA) 108 (90 Base) MCG/ACT inhaler TAKE 2 PUFFS BY MOUTH EVERY 6 HOURS AS NEEDED FOR WHEEZE OR SHORTNESS OF BREATH 05/18/20   Coral Ceo, NP  budesonide-formoterol (SYMBICORT) 80-4.5 MCG/ACT inhaler 2 puffs in the morning AS NEEDED right when you wake up, rinse out your mouth after use, Can repeat in 12 hours later 2 puffs AS NEEDED, rinse after use 03/03/20   Coral Ceo, NP  cetirizine (ZYRTEC) 10 MG tablet Take 10 mg by mouth daily as needed.    [provider]  HYDROcodone-acetaminophen (NORCO) 7.5-325 MG tablet Take 1 tablet by mouth every 4  (four) hours as needed for moderate pain. 02/18/21   Yolonda Kida, MD  ibuprofen (ADVIL) 200 MG tablet Take 200 mg by mouth every 6 (six) hours as needed.    [provider]  metoprolol succinate (TOPROL XL) 25 MG 24 hr tablet Take 1 tablet (25 mg total) by mouth daily. Patient not taking: No sig reported 06/22/18   Runell Gess, MD  Multiple Vitamins-Minerals (CENTRUM MEN PO) Take by mouth daily. gummy    [provider]  ondansetron (ZOFRAN ODT) 4 MG disintegrating tablet Take 1 tablet (4 mg total) by mouth every 8 (eight) hours as needed for nausea or vomiting. 02/18/21   Yolonda Kida, MD  oxyCODONE (ROXICODONE) 5 MG immediate release tablet Take 1 tablet (5 mg total) by mouth every 4 (four) hours as needed for severe pain. 02/18/21 02/18/22  Yolonda Kida, MD    Allergies    Patient has no known allergies.  Review of Systems   Review of Systems  Neurological:  Positive for headaches.  All other systems reviewed and are negative.  Physical Exam Updated Vital Signs BP 118/79   Pulse (!) 49   Temp 98.4 F (36.9 C) (Oral)   Resp 16   Ht 6\' 3"  (1.905 m)   Wt 83.9 kg   SpO2 100%   BMI 23.12 kg/m   Physical Exam Vitals and nursing note reviewed.  Constitutional:      General: He is not in acute distress.    Appearance: Normal appearance. He is well-developed.  HENT:     Head: Normocephalic and atraumatic.     Right Ear: Hearing normal.     Left Ear: Hearing normal.     Nose: Nose normal.  Eyes:     Conjunctiva/sclera: Conjunctivae normal.     Pupils: Pupils are equal, round, and reactive to light.  Cardiovascular:     Rate and Rhythm: Regular rhythm.     Heart sounds: S1 normal and S2 normal. No murmur heard.   No friction rub. No gallop.  Pulmonary:     Effort: Pulmonary effort is normal. No respiratory distress.     Breath sounds: Normal breath sounds.  Chest:     Chest wall: No tenderness.  Abdominal:     General: Bowel  sounds are normal.     Palpations: Abdomen is soft.     Tenderness: There is no abdominal tenderness. There is no guarding or rebound. Negative signs include Murphy's sign and McBurney's sign.     Hernia: No hernia is present.  Musculoskeletal:        General: Normal range of motion.     Cervical back: Normal range of motion and neck supple.  Skin:    General: Skin is warm and dry.     Findings: No rash.  Neurological:  Mental Status: He is alert and oriented to person, place, and time.     GCS: GCS eye subscore is 4. GCS verbal subscore is 5. GCS motor subscore is 6.     Cranial Nerves: No cranial nerve deficit.     Sensory: No sensory deficit.     Coordination: Coordination normal.  Psychiatric:        Speech: Speech normal.        Behavior: Behavior normal.        Thought Content: Thought content normal.    ED Results / Procedures / Treatments   Labs (all labs ordered are listed, but only abnormal results are displayed) Labs Reviewed - No data to display  EKG None  Radiology No results found.  Procedures Procedures   Medications Ordered in ED Medications  ketorolac (TORADOL) 30 MG/ML injection 30 mg (30 mg Intravenous Given by Other 07/11/21 0008)  prochlorperazine (COMPAZINE) injection 10 mg (10 mg Intravenous Given by Other 07/11/21 0004)  diphenhydrAMINE (BENADRYL) injection 25 mg (25 mg Intravenous Given 07/11/21 0001)  sodium chloride 0.9 % bolus 1,000 mL (1,000 mLs Intravenous New Bag/Given 07/11/21 0000)    ED Course  I have reviewed the triage vital signs and the nursing notes.  Pertinent labs & imaging results that were available during my care of the patient were reviewed by me and considered in my medical decision making (see chart for details).    MDM Rules/Calculators/A&P                          Patient presents to the emergency department for evaluation of headache.  Patient reports previous history of cluster headaches.  He has been having daily  right-sided headaches for the last week.  Patient reports that these headaches are similar to previous headaches, no new features.  No red flags with this current presentation.  Does not require imaging.  Patient treated with migraine cocktail, fluids, oxygen therapy.  He has had resolution of his headache.  Will discharge.  Final Clinical Impression(s) / ED Diagnoses Final diagnoses:  Episodic cluster headache, not intractable    Rx / DC Orders ED Discharge Orders     None        Esiquio Boesen, Canary Brim, MD 07/11/21 [phone]

## 2021-07-10 NOTE — ED Triage Notes (Signed)
Pt reports HA consistently for a week; hx of cluster HA; no relief with OTC

## 2021-07-12 DIAGNOSIS — M25561 Pain in right knee: Secondary | ICD-10-CM | POA: Diagnosis not present

## 2021-08-08 IMAGING — DX DG CHEST 2V
2 series · 2 of 2 positions shown · non-contrast
Comparison: February 19, 2018.

CLINICAL DATA: Chest congestion.

EXAM:
CHEST - 2 VIEW

[chest pa]
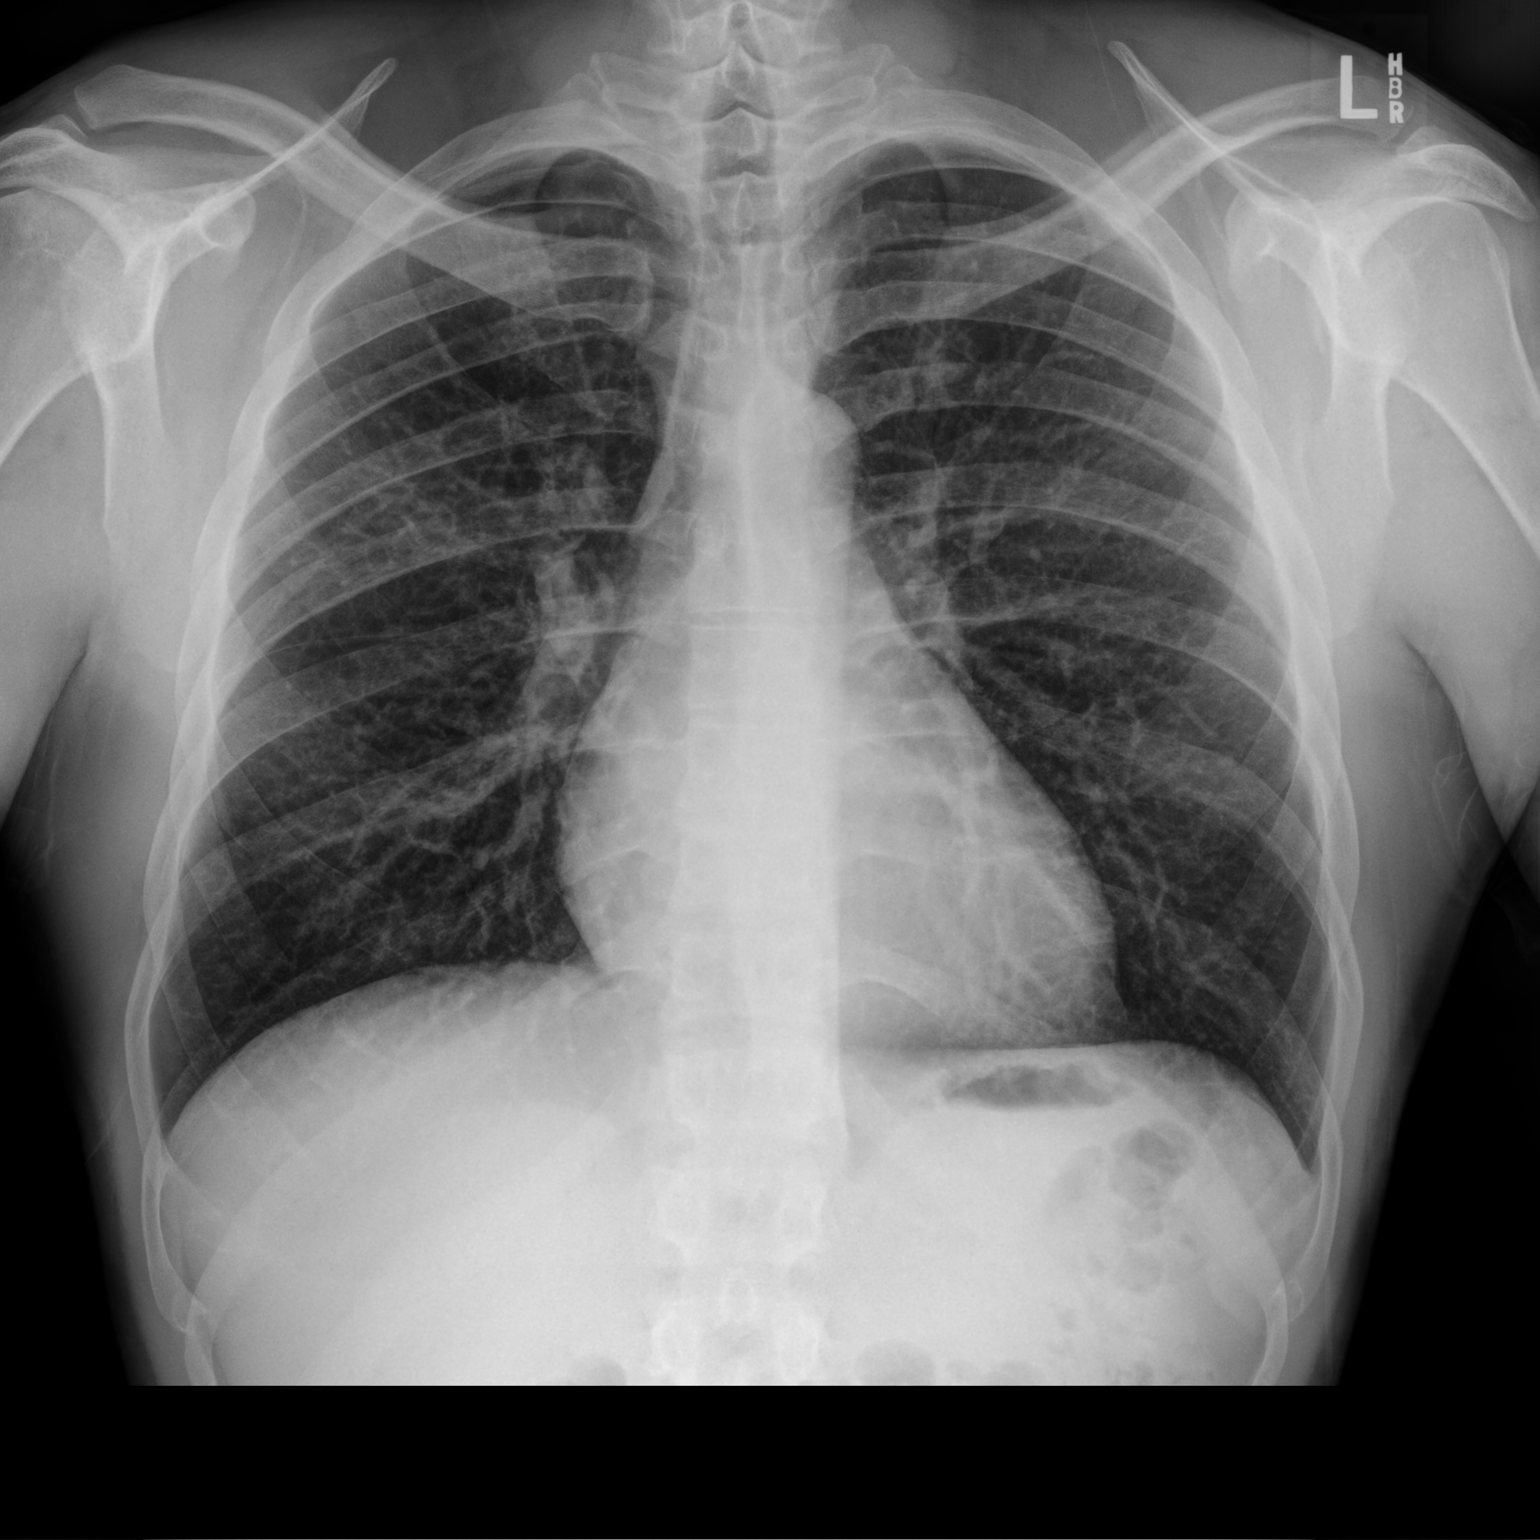

[chest lat]
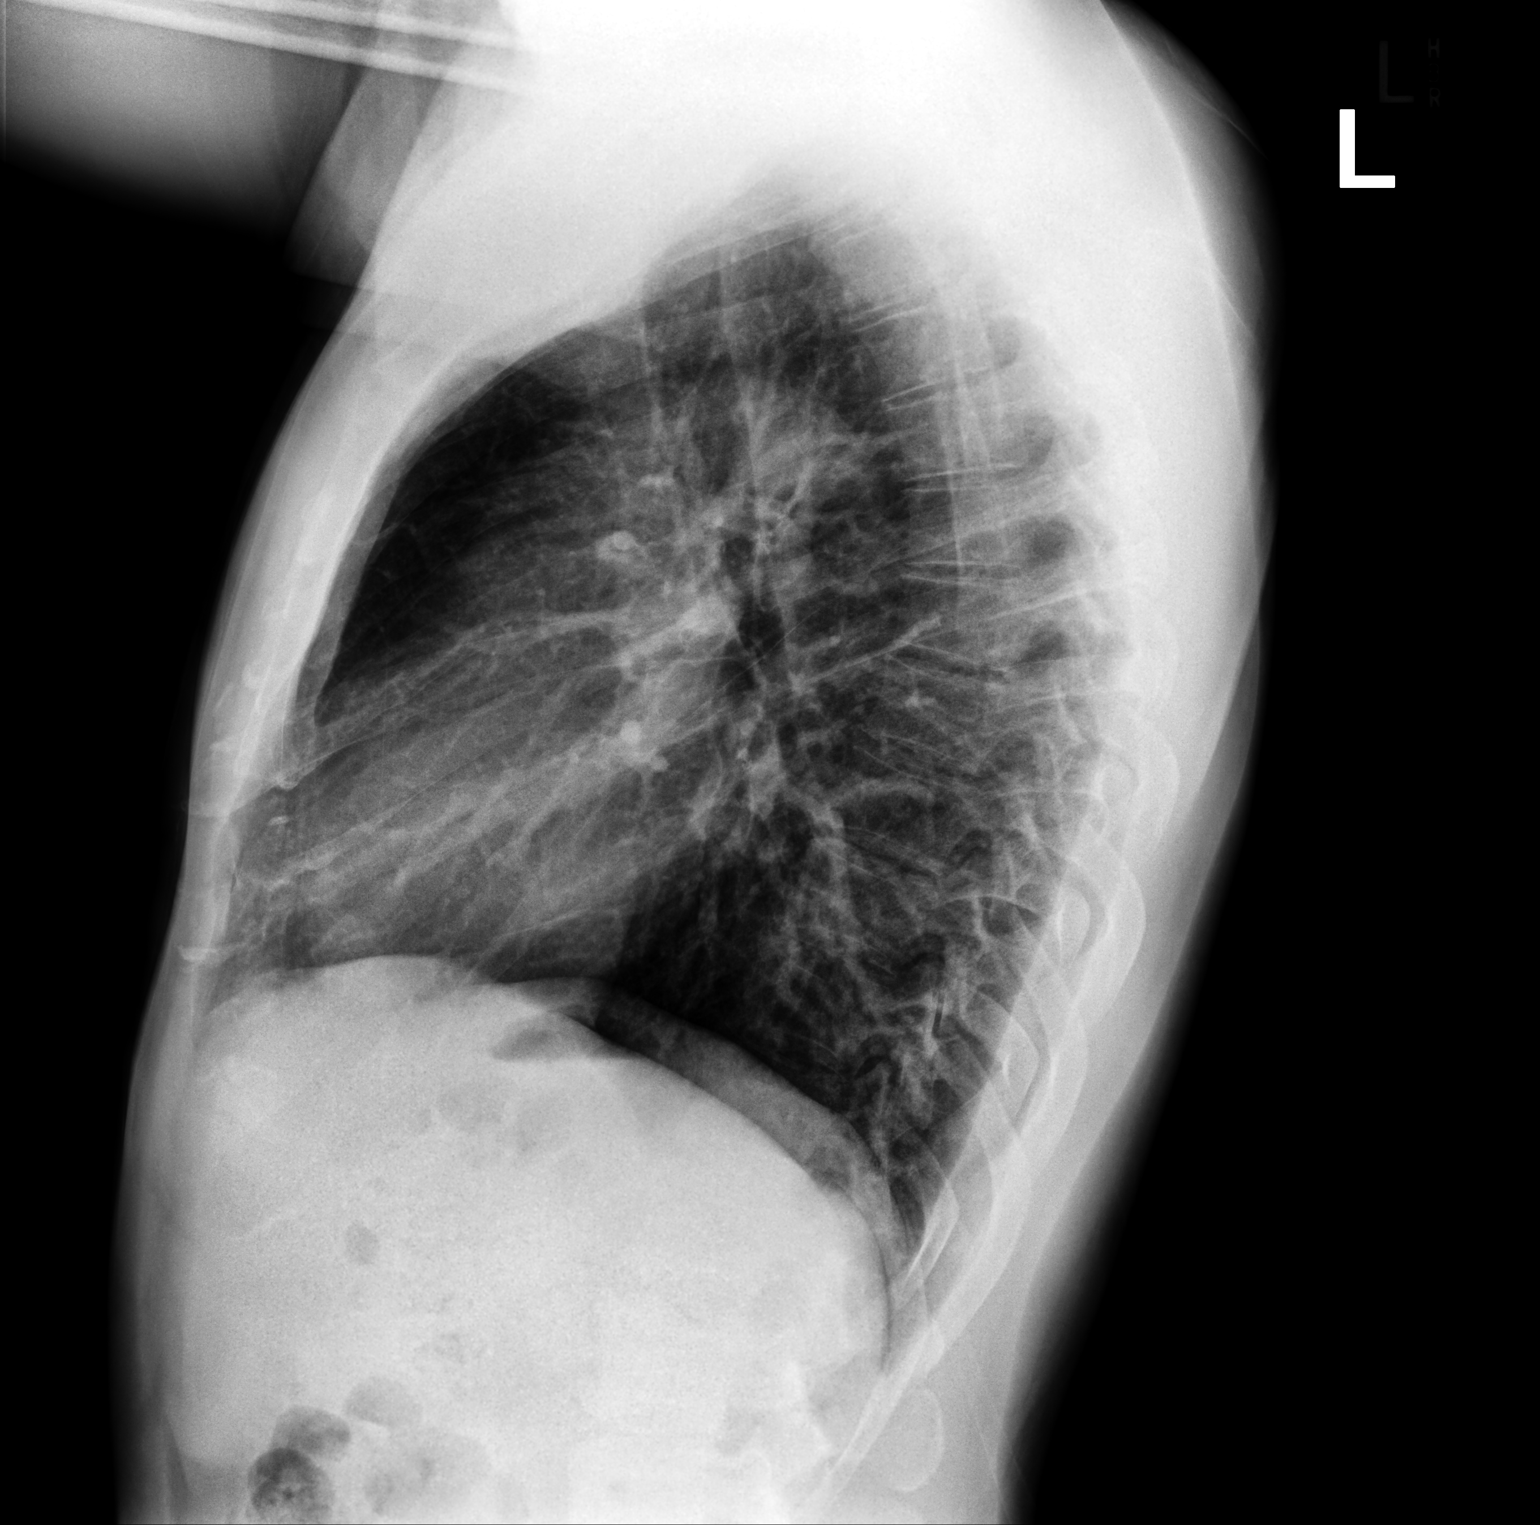

[2 of 2 positions shown; findings below may reference images not displayed]

FINDINGS: The heart size and mediastinal contours are within normal limits.
Both lungs are clear. No pneumothorax or pleural effusion is noted.
The visualized skeletal structures are unremarkable.
IMPRESSION: No active cardiopulmonary disease.

## 2021-08-24 ENCOUNTER — Ambulatory Visit (INDEPENDENT_AMBULATORY_CARE_PROVIDER_SITE_OTHER): Payer: Federal, State, Local not specified - PPO | Admitting: Adult Health

## 2021-08-24 ENCOUNTER — Other Ambulatory Visit: Payer: Self-pay

## 2021-08-24 ENCOUNTER — Encounter: Payer: Self-pay | Admitting: Adult Health

## 2021-08-24 VITALS — BP 122/88 | HR 71 | Temp 98.7°F | Ht 74.5 in | Wt 177.0 lb

## 2021-08-24 DIAGNOSIS — Z1159 Encounter for screening for other viral diseases: Secondary | ICD-10-CM | POA: Diagnosis not present

## 2021-08-24 DIAGNOSIS — J4541 Moderate persistent asthma with (acute) exacerbation: Secondary | ICD-10-CM

## 2021-08-24 DIAGNOSIS — R002 Palpitations: Secondary | ICD-10-CM

## 2021-08-24 DIAGNOSIS — Z0000 Encounter for general adult medical examination without abnormal findings: Secondary | ICD-10-CM

## 2021-08-24 LAB — COMPREHENSIVE METABOLIC PANEL
ALT: 11 U/L (ref 0–53)
AST: 17 U/L (ref 0–37)
Albumin: 4.3 g/dL (ref 3.5–5.2)
Alkaline Phosphatase: 51 U/L (ref 39–117)
BUN: 17 mg/dL (ref 6–23)
CO2: 29 mEq/L (ref 19–32)
Calcium: 10 mg/dL (ref 8.4–10.5)
Chloride: 103 mEq/L (ref 96–112)
Creatinine, Ser: 1.1 mg/dL (ref 0.40–1.50)
GFR: 84.49 mL/min (ref >60.00)
Glucose, Bld: 84 mg/dL (ref 70–99)
Potassium: 4.5 mEq/L (ref 3.5–5.1)
Sodium: 138 mEq/L (ref 135–145)
Total Bilirubin: 0.7 mg/dL (ref 0.2–1.2)
Total Protein: 6.7 g/dL (ref 6.0–8.3)

## 2021-08-24 LAB — LIPID PANEL
Cholesterol: 181 mg/dL (ref 0–200)
HDL: 50.1 mg/dL (ref >39.00)
LDL Cholesterol: 118 mg/dL — ABNORMAL HIGH (ref 0–99)
NonHDL: 130.43
Total CHOL/HDL Ratio: 4
Triglycerides: 60 mg/dL (ref 0.0–149.0)
VLDL: 12 mg/dL (ref 0.0–40.0)

## 2021-08-24 LAB — CBC WITH DIFFERENTIAL/PLATELET
Basophils Absolute: 0 10*3/uL (ref 0.0–0.1)
Basophils Relative: 0.9 % (ref 0.0–3.0)
Eosinophils Absolute: 0.1 10*3/uL (ref 0.0–0.7)
Eosinophils Relative: 1.6 % (ref 0.0–5.0)
HCT: 43 % (ref 39.0–52.0)
Hemoglobin: 14.2 g/dL (ref 13.0–17.0)
Lymphocytes Relative: 32.4 % (ref 12.0–46.0)
Lymphs Abs: 1.1 10*3/uL (ref 0.7–4.0)
MCHC: 33 g/dL (ref 30.0–36.0)
MCV: 87.8 fl (ref 78.0–100.0)
Monocytes Absolute: 0.3 10*3/uL (ref 0.1–1.0)
Monocytes Relative: 7.3 % (ref 3.0–12.0)
Neutro Abs: 2 10*3/uL (ref 1.4–7.7)
Neutrophils Relative %: 57.8 % (ref 43.0–77.0)
Platelets: 188 10*3/uL (ref 150.0–400.0)
RBC: 4.9 Mil/uL (ref 4.22–5.81)
RDW: 13.3 % (ref 11.5–15.5)
WBC: 3.5 10*3/uL — ABNORMAL LOW (ref 4.0–10.5)

## 2021-08-24 LAB — TSH: TSH: 0.45 u[IU]/mL (ref 0.35–5.50)

## 2021-08-24 NOTE — Patient Instructions (Signed)
It was great seeing you today   We will follow up with your labs   I will see you back in one year or sooner if needed

## 2021-08-24 NOTE — Progress Notes (Signed)
Subjective:    Patient ID: Austin Curry, male    DOB: July 16, 1981, 40 y.o.   MRN: [id number]  HPI Patient presents for yearly preventative medicine examination. 40 year old male who  has a past medical history of Environmental allergies, History of hypertension, Injury of ligament of right knee, Intermittent palpitations (01-12-2021  per pt have been taking toprol since 09/ 2021 did not think he needed any more, only feel palpitations when lifting something heavy), and Moderate persistent asthma.  Asthma -is managed by pulmonary.  Currently prescribed a rescue inhaler as needed and albuterol nebulizers as needed.  He uses Symbicort twice daily during spring and summer.  Reports no recent flares  Palpitations -managed by cardiology.he was prescribed Toprol 25 mg, but stopped taking this about a year ago.  Continues to have palpitations but only when he lifts heavy objects.   Right Knee Pain -s/p right knee arthroscopy limited synovectomy with medial patellar femoral ligament reconstruction with hamstring allograft in February 2022.  He does report that he has done well post surgery, no longer having discomfort.  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   All immunizations and health maintenance protocols were reviewed with the patient and needed orders were placed.  Appropriate screening laboratory values were ordered for the patient including screening of hyperlipidemia, renal function and hepatic function.  Medication reconciliation,  past medical history, social history, problem list and allergies were reviewed in detail with the patient  Goals were established with regard to weight loss, exercise, and  diet in compliance with medications BP Readings from Last 3 Encounters:  08/24/21 122/88  07/11/21 118/79  02/18/21 121/83     Review of Systems  Constitutional: Negative.   HENT: Negative.    Eyes: Negative.   Respiratory: Negative.    Cardiovascular: Negative.   Gastrointestinal:  Negative.   Endocrine: Negative.   Genitourinary: Negative.   Musculoskeletal: Negative.   Skin: Negative.   Allergic/Immunologic: Negative.   Neurological: Negative.   Hematological: Negative.   Psychiatric/Behavioral: Negative.    All other systems reviewed and are negative.  Past Medical History:  Diagnosis Date   Environmental allergies    History of hypertension    Injury of ligament of right knee    right medial patellofemoral ligament tear   Intermittent palpitations 01-12-2021  per pt have been taking toprol since 09/ 2021 did not think he needed any more, only feel palpitations when lifting something heavy   previous seen by cardiology,  dr Allyson Sabalberry, lov 06-26-2019 pt was released prn basis;  event monitor 06-09-2018 showed ns/ st   Moderate persistent asthma    pulmonology---  b. Cherre Hugermack  NP  (01-12-2021 per pt last exacerbation approxl. 05/ 2021)    Social History   Socioeconomic History   Marital status: Single    Spouse name: Not on file   Number of children: Not on file   Years of education: Not on file   Highest education level: Not on file  Occupational History   Occupation: Mail Carrier     Employer: [employer]  Tobacco Use   Smoking status: Former    Years: 15.00    Types: Cigarettes    Quit date: 12/09/2014    Years since quitting: 6.7   Smokeless tobacco: Never   Tobacco comments:    01-12-2021 per pt 2 cig per day  Vaping Use   Vaping Use: Never used  Substance and Sexual Activity   Alcohol use: Not Currently  Alcohol/week: 0.0 standard drinks    Comment: occasional   Drug use: Never   Sexual activity: Not on file  Other Topics Concern   Not on file  Social History Narrative   Married    No children       Social Determinants of Health   Financial Resource Strain: Not on file  Food Insecurity: Not on file  Transportation Needs: Not on file  Physical Activity: Not on file  Stress: Not on file  Social Connections: Not on file  Intimate Partner  Violence: Not on file    Past Surgical History:  Procedure Laterality Date   INSERTION OF MESH N/A 04/18/2016   Procedure: INSERTION OF MESH;  Surgeon: Abigail Miyamoto, MD;  Location: [facility];  Service: General;  Laterality: N/A;   KNEE ARTHROSCOPY WITH MEDIAL PATELLAR FEMORAL LIGAMENT RECONSTRUCTION Right 02/18/2021   Procedure: RIGHT KNEE ARTHROSCOPYLIMITED SYNOVECTOMY WITH MEDIAL PATELLAR FEMORAL LIGAMENT RECONSTRUCTION WITH HAMSTRING ALLOGRAFT;  Surgeon: Yolonda Kida, MD;  Location: [facility];  Service: Orthopedics;  Laterality: Right;  2 HRS   VENTRAL HERNIA REPAIR N/A 04/18/2016   Procedure:  VENTRAL HERNIA REPAIR;  Surgeon: Abigail Miyamoto, MD;  Location: [facility];  Service: General;  Laterality: N/A;    Family History  Problem Relation Age of Onset   Diabetes Mother    Hypertension Mother    CVA Mother    Hypercholesterolemia Mother    Hyperlipidemia Father    Asthma Brother    Asthma Brother     No Known Allergies  Current Outpatient Medications on File Prior to Visit  Medication Sig Dispense Refill   acetaminophen (TYLENOL) 500 MG tablet Take 500 mg by mouth every 6 (six) hours as needed.     cetirizine (ZYRTEC) 10 MG tablet Take 10 mg by mouth daily as needed.     ibuprofen (ADVIL) 200 MG tablet Take 200 mg by mouth every 6 (six) hours as needed.     metoprolol succinate (TOPROL XL) 25 MG 24 hr tablet Take 1 tablet (25 mg total) by mouth daily. 90 tablet 3   Multiple Vitamins-Minerals (CENTRUM MEN PO) Take by mouth daily. gummy     albuterol (PROVENTIL) (2.5 MG/3ML) 0.083% nebulizer solution Take 3 mLs (2.5 mg total) by nebulization every 4 (four) hours as needed for wheezing or shortness of breath. (Patient not taking: Reported on 08/24/2021) 30 mL 3   albuterol (VENTOLIN HFA) 108 (90 Base) MCG/ACT inhaler TAKE 2 PUFFS BY MOUTH EVERY 6 HOURS AS NEEDED FOR WHEEZE OR SHORTNESS OF BREATH (Patient not taking: Reported on 08/24/2021) 18 g 1    budesonide-formoterol (SYMBICORT) 80-4.5 MCG/ACT inhaler 2 puffs in the morning AS NEEDED right when you wake up, rinse out your mouth after use, Can repeat in 12 hours later 2 puffs AS NEEDED, rinse after use (Patient not taking: Reported on 08/24/2021) 1 Inhaler 12   No current facility-administered medications on file prior to visit.    BP 122/88   Pulse 71   Temp 98.7 F (37.1 C) (Oral)   Ht 6' 2.5" (1.892 m)   Wt 177 lb (80.3 kg)   SpO2 98%   BMI 22.42 kg/m        Objective:   Physical Exam Vitals and nursing note reviewed.  Constitutional:      General: He is not in acute distress.    Appearance: Normal appearance. He is well-developed and normal weight.  HENT:     Head: Normocephalic and atraumatic.  Right Ear: Tympanic membrane, ear canal and external ear normal. There is no impacted cerumen.     Left Ear: Tympanic membrane, ear canal and external ear normal. There is no impacted cerumen.     Nose: Nose normal. No congestion or rhinorrhea.     Mouth/Throat:     Mouth: Mucous membranes are moist.     Pharynx: Oropharynx is clear. No oropharyngeal exudate or posterior oropharyngeal erythema.  Eyes:     General:        Right eye: No discharge.        Left eye: No discharge.     Extraocular Movements: Extraocular movements intact.     Conjunctiva/sclera: Conjunctivae normal.     Pupils: Pupils are equal, round, and reactive to light.  Neck:     Vascular: No carotid bruit.     Trachea: No tracheal deviation.  Cardiovascular:     Rate and Rhythm: Normal rate and regular rhythm.     Pulses: Normal pulses.     Heart sounds: Normal heart sounds. No murmur heard.   No friction rub. No gallop.  Pulmonary:     Effort: Pulmonary effort is normal. No respiratory distress.     Breath sounds: Normal breath sounds. No stridor. No wheezing, rhonchi or rales.  Chest:     Chest wall: No tenderness.  Abdominal:     General: Bowel sounds are normal. There is no distension.      Palpations: Abdomen is soft. There is no mass.     Tenderness: There is no abdominal tenderness. There is no right CVA tenderness, left CVA tenderness, guarding or rebound.     Hernia: No hernia is present.  Musculoskeletal:        General: No swelling, tenderness, deformity or signs of injury. Normal range of motion.     Right lower leg: No edema.     Left lower leg: No edema.  Lymphadenopathy:     Cervical: No cervical adenopathy.  Skin:    General: Skin is warm and dry.     Capillary Refill: Capillary refill takes less than 2 seconds.     Coloration: Skin is not jaundiced or pale.     Findings: No bruising, erythema, lesion or rash.  Neurological:     General: No focal deficit present.     Mental Status: He is alert and oriented to person, place, and time.     Cranial Nerves: No cranial nerve deficit.     Sensory: No sensory deficit.     Motor: No weakness.     Coordination: Coordination normal.     Gait: Gait normal.     Deep Tendon Reflexes: Reflexes normal.  Psychiatric:        Mood and Affect: Mood normal.        Behavior: Behavior normal.        Thought Content: Thought content normal.        Judgment: Judgment normal.      Assessment & Plan:   1. Routine general medical examination at a health care facility - Benign exam  - Healthy 40 year old male  - CBC with Differential/Platelet; Future - Comprehensive metabolic panel; Future - Lipid panel; Future - TSH; Future - TSH - Lipid panel - Comprehensive metabolic panel - CBC with Differential/Platelet  2. Moderate persistent asthma with exacerbation - Continue with treatment by Pulmonary  - CBC with Differential/Platelet; Future - Comprehensive metabolic panel; Future - Lipid panel; Future - TSH; Future - TSH -  Lipid panel - Comprehensive metabolic panel - CBC with Differential/Platelet  3. Palpitations  - CBC with Differential/Platelet; Future - Comprehensive metabolic panel; Future - Lipid panel;  Future - TSH; Future - TSH - Lipid panel - Comprehensive metabolic panel - CBC with Differential/Platelet  4. Need for hepatitis C screening test  - Hep C Antibody; Future - Hep C Antibody   Shirline Frees, NP

## 2021-08-25 LAB — HEPATITIS C ANTIBODY
Hepatitis C Ab: NONREACTIVE
SIGNAL TO CUT-OFF: 0.01 (ref <1.00)

## 2021-09-21 ENCOUNTER — Other Ambulatory Visit: Payer: Self-pay | Admitting: Pulmonary Disease

## 2021-09-21 DIAGNOSIS — J4541 Moderate persistent asthma with (acute) exacerbation: Secondary | ICD-10-CM

## 2021-09-29 IMAGING — DX DG KNEE 1-2V PORT*R*
4 series · 4 of 4 positions shown · non-contrast
Comparison: June 13, 2019

CLINICAL DATA: Right knee pain and swelling.

EXAM:
PORTABLE RIGHT KNEE - 1-2 VIEW

[knee ap]
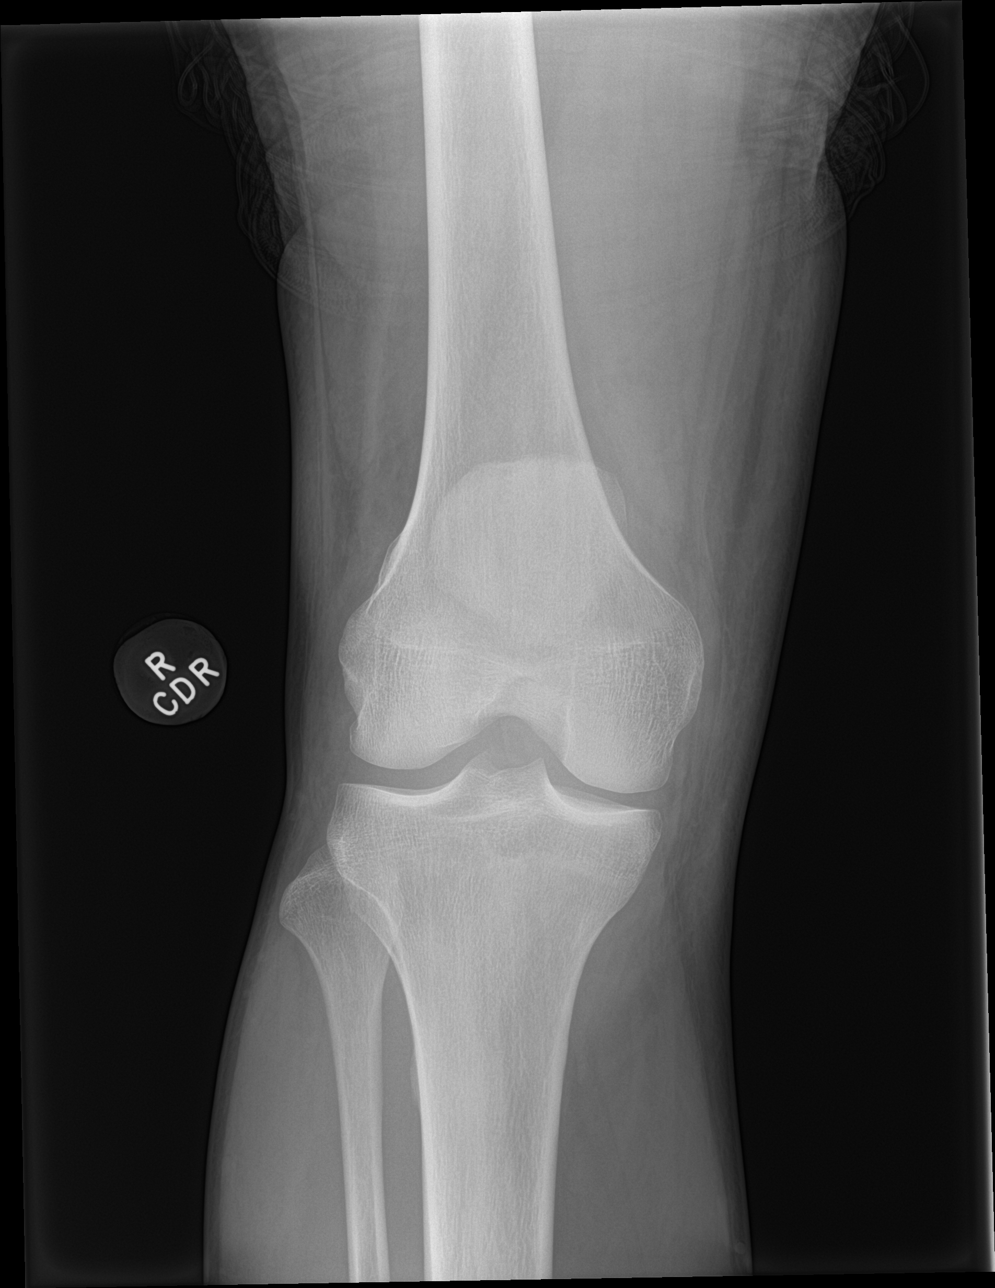

[knee obl (1 of 2)]
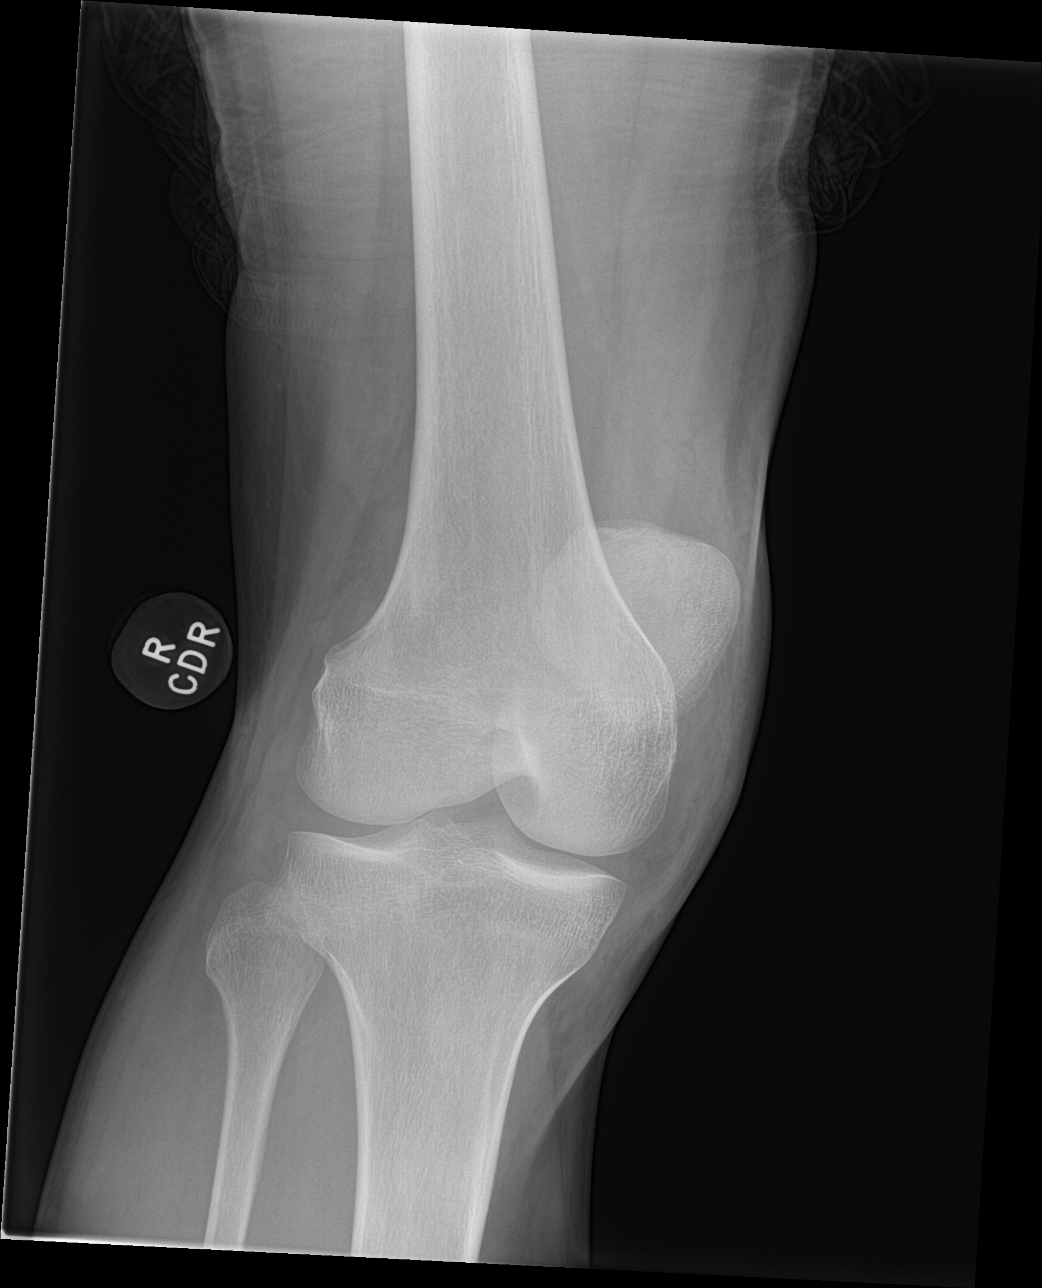

[knee obl (2 of 2)]
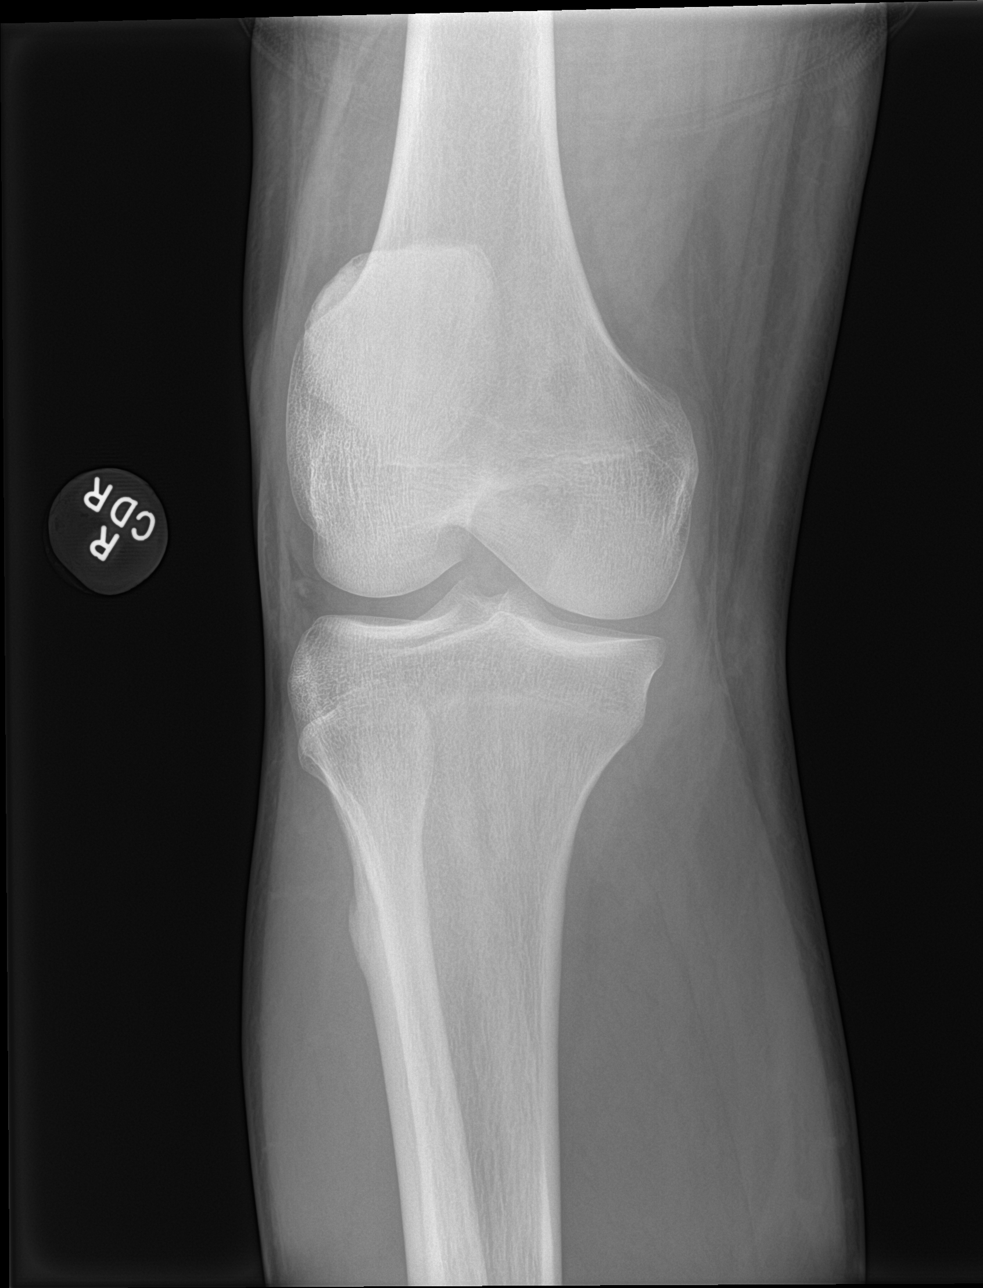

[knee lat]
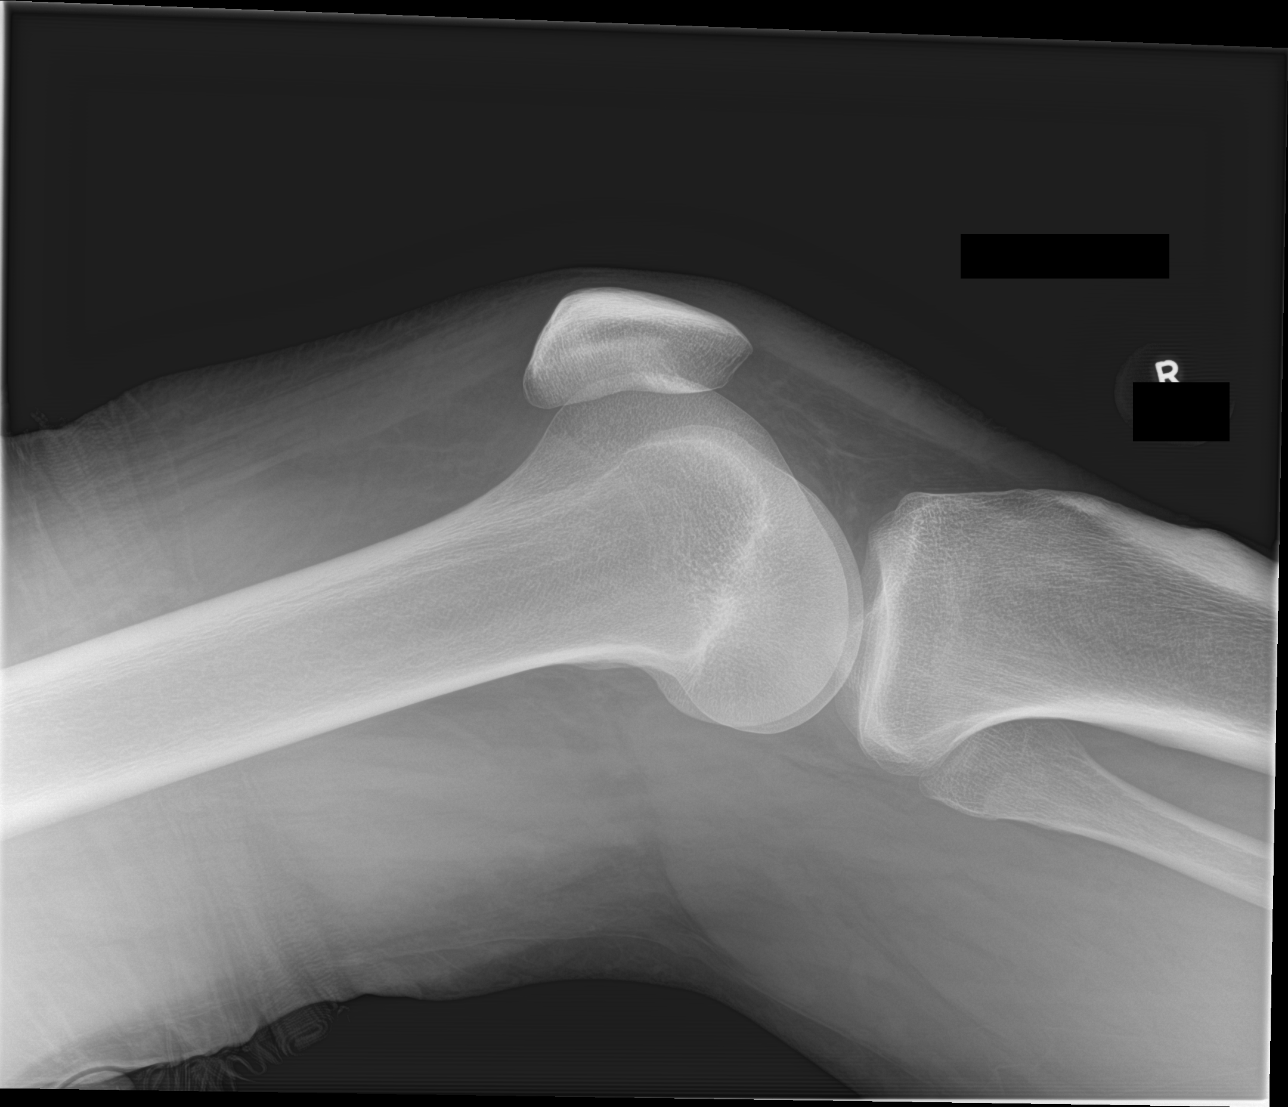

[4 of 4 positions shown; findings below may reference images not displayed]

FINDINGS: No evidence of fracture, dislocation, or joint effusion. No evidence
of arthropathy or other focal bone abnormality. Soft tissues are
unremarkable.
IMPRESSION: Negative.

## 2021-10-12 ENCOUNTER — Ambulatory Visit (INDEPENDENT_AMBULATORY_CARE_PROVIDER_SITE_OTHER): Payer: Federal, State, Local not specified - PPO | Admitting: Primary Care

## 2021-10-12 ENCOUNTER — Encounter: Payer: Self-pay | Admitting: Primary Care

## 2021-10-12 ENCOUNTER — Other Ambulatory Visit: Payer: Self-pay

## 2021-10-12 VITALS — BP 116/64 | HR 71 | Temp 98.4°F | Ht 74.0 in | Wt 176.6 lb

## 2021-10-12 DIAGNOSIS — J4531 Mild persistent asthma with (acute) exacerbation: Secondary | ICD-10-CM

## 2021-10-12 DIAGNOSIS — J4541 Moderate persistent asthma with (acute) exacerbation: Secondary | ICD-10-CM

## 2021-10-12 DIAGNOSIS — Z23 Encounter for immunization: Secondary | ICD-10-CM

## 2021-10-12 DIAGNOSIS — M25561 Pain in right knee: Secondary | ICD-10-CM | POA: Diagnosis not present

## 2021-10-12 MED ORDER — BUDESONIDE-FORMOTEROL FUMARATE 80-4.5 MCG/ACT IN AERO: INHALATION_SPRAY | RESPIRATORY_TRACT | 12 refills | Status: DC

## 2021-10-12 MED ORDER — ALBUTEROL SULFATE (2.5 MG/3ML) 0.083% IN NEBU
2.5000 mg | INHALATION_SOLUTION | RESPIRATORY_TRACT | 3 refills | Status: DC | PRN
Start: 2021-10-12 — End: 2023-02-15

## 2021-10-12 MED ORDER — CETIRIZINE HCL 10 MG PO TABS: 10.0000 mg | ORAL_TABLET | Freq: Every day | ORAL | 3 refills | Status: DC | PRN

## 2021-10-12 MED ORDER — ALBUTEROL SULFATE HFA 108 (90 BASE) MCG/ACT IN AERS: INHALATION_SPRAY | RESPIRATORY_TRACT | 3 refills | Status: DC

## 2021-10-12 NOTE — Assessment & Plan Note (Addendum)
-   Well controlled; No recent exacerbations, hospitalization or oral prednisone use. ACT score 18. He has intermittent asthma symptoms that wake him up in the morning 2-3 times a week. He uses Symbicort PRN only. Advised he take Symbicort 1-2 puffs twice daily consistently and continue Zyrtec 10mg  daily. Patient received influenza vaccine today. Refills provided. FU in 6 months with either Dr. or Isaiah Serge (new patient).

## 2021-10-12 NOTE — Patient Instructions (Signed)
Nice meeting you today Mr Toya  Recommendations: Use Symbicort 1-2 puffs morning and evening Use Albuterol rescue inhaler or nebulizer every 6 hours as needed for breakthrough shortness of breath/wheezing  Continue Zyrtec 10mg  daily Refills sent to pharmacy on file   Follow-up: 6 months with Dr. or Isaiah Serge (new patient-asthma)

## 2021-10-12 NOTE — Progress Notes (Signed)
@Patient  ID: , male    DOB: [birth year], 40 y.o.   MRN: [id number]  Chief Complaint  Patient presents with   Follow-up    Needs prescriptions and flu shot    Referring provider: [id number], NP  HPI: 40 year old male, former smoker quit in 2015. PMH significant for allergic asthma, HTN, environmental allergies. Former patient of Dr. 2016, last see by pulmonary NP on 03/03/20.   10/12/2021- interim hx  Patient presents today for overdue annual follow-up/ asthma.  He is doing well today, no acute complaints. Asthma symptoms are well controlled using Symbicort 10/14/2021 as needed. He takes zyrtec 10mg  daily. He does have intermittent asthma symptoms that wake him up in the morning 2-3 times a week. No recent exacerbations, hospitalization or oral prednisone use. Refills provided today.   ACT score- 18 Last exacerbation - > 1 year ago   No Known Allergies  Immunization History  Administered Date(s) Administered   Influenza,inj,Quad PF,6+ Mos 09/05/2016, 11/22/2017, 01/17/2019, 12/05/2019, 10/12/2021   PPD Test 09/11/2013   Tdap 06/25/2013    Past Medical History:  Diagnosis Date   Environmental allergies    History of hypertension    Injury of ligament of right knee    right medial patellofemoral ligament tear   Intermittent palpitations 01-12-2021  per pt have been taking toprol since 09/ 2021 did not think he needed any more, only feel palpitations when lifting something heavy   previous seen by cardiology,  dr 02-04-1971, lov 06-26-2019 pt was released prn basis;  event monitor 06-09-2018 showed ns/ st   Moderate persistent asthma    pulmonology---  b. 08-27-2019  NP  (01-12-2021 per pt last exacerbation approxl. 05/ 2021)    Tobacco History: Social History   Tobacco Use  Smoking Status Former   Years: 15.00   Types: Cigarettes   Quit date: 12/09/2014   Years since quitting: 6.8  Smokeless Tobacco Never  Tobacco Comments   01-12-2021 per pt 2 cig per day    Counseling given: Not Answered Tobacco comments: 01-12-2021 per pt 2 cig per day   Outpatient Medications Prior to Visit  Medication Sig Dispense Refill   acetaminophen (TYLENOL) 500 MG tablet Take 500 mg by mouth every 6 (six) hours as needed.     ibuprofen (ADVIL) 200 MG tablet Take 200 mg by mouth every 6 (six) hours as needed.     metoprolol succinate (TOPROL XL) 25 MG 24 hr tablet Take 1 tablet (25 mg total) by mouth daily. 90 tablet 3   Multiple Vitamins-Minerals (CENTRUM MEN PO) Take by mouth daily. gummy     albuterol (PROVENTIL) (2.5 MG/3ML) 0.083% nebulizer solution Take 3 mLs (2.5 mg total) by nebulization every 4 (four) hours as needed for wheezing or shortness of breath. 30 mL 3   albuterol (VENTOLIN HFA) 108 (90 Base) MCG/ACT inhaler TAKE 2 PUFFS BY MOUTH EVERY 6 HOURS AS NEEDED FOR WHEEZE OR SHORTNESS OF BREATH 18 g 1   budesonide-formoterol (SYMBICORT) 80-4.5 MCG/ACT inhaler 2 puffs in the morning AS NEEDED right when you wake up, rinse out your mouth after use, Can repeat in 12 hours later 2 puffs AS NEEDED, rinse after use 1 Inhaler 12   cetirizine (ZYRTEC) 10 MG tablet Take 10 mg by mouth daily as needed.     No facility-administered medications prior to visit.    Review of Systems  Review of Systems  Constitutional: Negative.   HENT: Negative.    Respiratory:  Negative  for cough, chest tightness, shortness of breath and wheezing.   Cardiovascular: Negative.     Physical Exam  BP 116/64 (BP Location: Left Arm, Patient Position: Sitting, Cuff Size: Normal)   Pulse 71   Temp 98.4 F (36.9 C) (Oral)   Ht 6\' 2"  (1.88 m)   Wt 176 lb 9.6 oz (80.1 kg)   SpO2 98%   BMI 22.67 kg/m  Physical Exam Constitutional:      Appearance: Normal appearance.  HENT:     Head: Normocephalic and atraumatic.     Mouth/Throat:     Mouth: Mucous membranes are moist.     Pharynx: Oropharynx is clear.  Cardiovascular:     Rate and Rhythm: Normal rate and regular rhythm.   Pulmonary:     Effort: Pulmonary effort is normal.     Breath sounds: Normal breath sounds. No wheezing, rhonchi or rales.  Musculoskeletal:        General: Normal range of motion.  Skin:    General: Skin is warm and dry.  Neurological:     General: No focal deficit present.     Mental Status: He is alert and oriented to person, place, and time. Mental status is at baseline.  Psychiatric:        Mood and Affect: Mood normal.        Behavior: Behavior normal.        Thought Content: Thought content normal.        Judgment: Judgment normal.     Lab Results:  CBC    Component Value Date/Time   WBC 3.5 (L) 08/24/2021 0848   RBC 4.90 08/24/2021 0848   HGB 14.2 08/24/2021 0848   HCT 43.0 08/24/2021 0848   PLT 188.0 08/24/2021 0848   MCV 87.8 08/24/2021 0848   MCH 28.9 07/30/2020 1334   MCHC 33.0 08/24/2021 0848   RDW 13.3 08/24/2021 0848   LYMPHSABS 1.1 08/24/2021 0848   MONOABS 0.3 08/24/2021 0848   EOSABS 0.1 08/24/2021 0848   BASOSABS 0.0 08/24/2021 0848    BMET    Component Value Date/Time   NA 138 08/24/2021 0848   NA 143 05/22/2018 1547   K 4.5 08/24/2021 0848   CL 103 08/24/2021 0848   CO2 29 08/24/2021 0848   GLUCOSE 84 08/24/2021 0848   BUN 17 08/24/2021 0848   BUN 17 05/22/2018 1547   CREATININE 1.10 08/24/2021 0848   CREATININE 1.12 09/02/2020 0821   CALCIUM 10.0 08/24/2021 0848   GFRNONAA 83 09/02/2020 0821   GFRAA 96 09/02/2020 0821    BNP No results found for: BNP  ProBNP No results found for: PROBNP  Imaging: No results found.   Assessment & Plan:   Asthma, chronic - Well controlled; No recent exacerbations, hospitalization or oral prednisone use. ACT score 18. He has intermittent asthma symptoms that wake him up in the morning 2-3 times a week. He uses Symbicort PRN only. Advised he take Symbicort 11/02/2020 1-2 puffs twice daily consistently and continue Zyrtec 10mg  daily. Refills of all medications provided. FU in 6 months with either Dr.  or (new patient).   30 mins spent on case; >50% face to face with patient   Isaiah Serge, NP 10/12/2021

## 2021-11-30 ENCOUNTER — Ambulatory Visit (INDEPENDENT_AMBULATORY_CARE_PROVIDER_SITE_OTHER): Payer: Federal, State, Local not specified - PPO

## 2021-11-30 ENCOUNTER — Ambulatory Visit (HOSPITAL_COMMUNITY)
Admission: EM | Admit: 2021-11-30 | Discharge: 2021-11-30 | Disposition: A | Discharge status: Home / Self Care | Payer: Federal, State, Local not specified - PPO | Attending: Urgent Care | Admitting: Urgent Care

## 2021-11-30 ENCOUNTER — Telehealth: Payer: Self-pay

## 2021-11-30 ENCOUNTER — Encounter (HOSPITAL_COMMUNITY): Payer: Self-pay | Admitting: Emergency Medicine

## 2021-11-30 ENCOUNTER — Other Ambulatory Visit: Payer: Self-pay

## 2021-11-30 DIAGNOSIS — R0789 Other chest pain: Secondary | ICD-10-CM

## 2021-11-30 DIAGNOSIS — J4530 Mild persistent asthma, uncomplicated: Secondary | ICD-10-CM | POA: Diagnosis not present

## 2021-11-30 DIAGNOSIS — R079 Chest pain, unspecified: Secondary | ICD-10-CM | POA: Diagnosis not present

## 2021-11-30 MED ORDER — TIZANIDINE HCL 4 MG PO TABS: 4.0000 mg | ORAL_TABLET | Freq: Every day | ORAL | 0 refills | Status: DC

## 2021-11-30 MED ORDER — NAPROXEN 500 MG PO TABS: 500.0000 mg | ORAL_TABLET | Freq: Two times a day (BID) | ORAL | 0 refills | Status: DC

## 2021-11-30 NOTE — Telephone Encounter (Signed)
--  Caller states he wants to schedule an appointment but transferred to discuss his symptoms. Caller states he is having pain on his right side in the rib cage/lung area that started yesterday morning. Caller thought it was gas and took peptobismol before he went to bed and ibuprofen this morning. Caller states the pain has not gotten any better since. Caller denies any other symptoms at this time.  11/29/2021 11:34:02 AM Go to ED Now Johnna Acosta, RN, Helmut Muster  Referrals [facility] - ED [facility] - ED  12/31/20 1034: Pt states he did not go to ED. States he took a muscle relaxer & tylenol & pain is still present. Expressed the importance of going to ED to have issue assessed b/c it could be something serious especially if it is not responding to medications. Pt verb understanding & states he will go to ED today.

## 2021-11-30 NOTE — ED Triage Notes (Signed)
Pt reports having right sided chest pains that radiates to his back that has been intermittent since Sunday. Worse with movement. Denies falls or injuries.

## 2021-11-30 NOTE — ED Provider Notes (Signed)
[facility]   MRN: [id number] DOB: 03/16/1981  Subjective:   [facility] Curry is a 40 y.o. male presenting for 3-day history of acute onset persistent right-sided chest wall pain that radiates toward the back.  Denies any fever, cough, shortness of breath, wheezing, chest tightness, body aches, malaise and fatigue.  Patient works at the [employer], does not have to do persistent heavy lifting.  No fall, trauma that he can think of.  Does not want to have respiratory testing.  No current facility-administered medications for this encounter.  Current Outpatient Medications:    acetaminophen (TYLENOL) 500 MG tablet, Take 500 mg by mouth every 6 (six) hours as needed., Disp: , Rfl:    albuterol (PROVENTIL) (2.5 MG/3ML) 0.083% nebulizer solution, Take 3 mLs (2.5 mg total) by nebulization every 4 (four) hours as needed for wheezing or shortness of breath., Disp: 30 mL, Rfl: 3   albuterol (VENTOLIN HFA) 108 (90 Base) MCG/ACT inhaler, TAKE 2 PUFFS BY MOUTH EVERY 6 HOURS AS NEEDED FOR WHEEZE OR SHORTNESS OF BREATH, Disp: 18 g, Rfl: 3   budesonide-formoterol (SYMBICORT) 80-4.5 MCG/ACT inhaler, 2 puffs in the morning AS NEEDED right when you wake up, rinse out your mouth after use, Can repeat in 12 hours later 2 puffs AS NEEDED, rinse after use, Disp: 1 each, Rfl: 12   cetirizine (ZYRTEC) 10 MG tablet, Take 1 tablet (10 mg total) by mouth daily as needed., Disp: 90 tablet, Rfl: 3   ibuprofen (ADVIL) 200 MG tablet, Take 200 mg by mouth every 6 (six) hours as needed., Disp: , Rfl:    metoprolol succinate (TOPROL XL) 25 MG 24 hr tablet, Take 1 tablet (25 mg total) by mouth daily., Disp: 90 tablet, Rfl: 3   Multiple Vitamins-Minerals (CENTRUM MEN PO), Take by mouth daily. gummy, Disp: , Rfl:    No Known Allergies  Past Medical History:  Diagnosis Date   Environmental allergies    History of hypertension    Injury of ligament of right knee    right medial patellofemoral ligament tear    Intermittent palpitations 01-12-2021  per pt have been taking toprol since 09/ 2021 did not think he needed any more, only feel palpitations when lifting something heavy   previous seen by cardiology,  dr Allyson Sabal, lov 06-26-2019 pt was released prn basis;  event monitor 06-09-2018 showed ns/ st   Moderate persistent asthma    pulmonology---  b. Cherre Huger  NP  (01-12-2021 per pt last exacerbation approxl. 05/ 2021)     Past Surgical History:  Procedure Laterality Date   INSERTION OF MESH N/A 04/18/2016   Procedure: INSERTION OF MESH;  Surgeon: Abigail Miyamoto, MD;  Location: [facility];  Service: General;  Laterality: N/A;   KNEE ARTHROSCOPY WITH MEDIAL PATELLAR FEMORAL LIGAMENT RECONSTRUCTION Right 02/18/2021   Procedure: RIGHT KNEE ARTHROSCOPYLIMITED SYNOVECTOMY WITH MEDIAL PATELLAR FEMORAL LIGAMENT RECONSTRUCTION WITH HAMSTRING ALLOGRAFT;  Surgeon: Yolonda Kida, MD;  Location: [facility];  Service: Orthopedics;  Laterality: Right;  2 HRS   VENTRAL HERNIA REPAIR N/A 04/18/2016   Procedure:  VENTRAL HERNIA REPAIR;  Surgeon: Abigail Miyamoto, MD;  Location: [facility];  Service: General;  Laterality: N/A;    Family History  Problem Relation Age of Onset   Diabetes Mother    Hypertension Mother    CVA Mother    Hypercholesterolemia Mother    Hyperlipidemia Father    Asthma Brother    Asthma Brother     Social  History   Tobacco Use   Smoking status: Former    Years: 15.00    Types: Cigarettes    Quit date: 12/09/2014    Years since quitting: 6.9   Smokeless tobacco: Never   Tobacco comments:    01-12-2021 per pt 2 cig per day  Vaping Use   Vaping Use: Never used  Substance Use Topics   Alcohol use: Not Currently    Alcohol/week: 0.0 standard drinks    Comment: occasional   Drug use: Never    ROS   Objective:   Vitals: BP 118/86 (BP Location: Left Arm)   Pulse 68   Temp 98.6 F (37 C) (Oral)   Resp 14   SpO2 96%   Physical Exam Constitutional:       General: He is not in acute distress.    Appearance: Normal appearance. He is well-developed. He is not ill-appearing, toxic-appearing or diaphoretic.  HENT:     Head: Normocephalic and atraumatic.     Right Ear: External ear normal.     Left Ear: External ear normal.     Nose: Nose normal.     Mouth/Throat:     Mouth: Mucous membranes are moist.     Pharynx: Oropharynx is clear.  Eyes:     General: No scleral icterus.    Extraocular Movements: Extraocular movements intact.     Pupils: Pupils are equal, round, and reactive to light.  Cardiovascular:     Rate and Rhythm: Normal rate and regular rhythm.     Heart sounds: Normal heart sounds. No murmur heard.   No friction rub. No gallop.  Pulmonary:     Effort: Pulmonary effort is normal. No respiratory distress.     Breath sounds: Normal breath sounds. No stridor. No wheezing, rhonchi or rales.  Chest:     Chest wall: Tenderness (Over area outlined that radiates to over the the analogous thoracic lateral chest wall) present.    Neurological:     Mental Status: He is alert and oriented to person, place, and time.  Psychiatric:        Mood and Affect: Mood normal.        Behavior: Behavior normal.        Thought Content: Thought content normal.    DG Chest 2 View  Result Date: 11/30/2021 CLINICAL DATA:  Right-sided chest pain, initial encounter EXAM: CHEST - 2 VIEW COMPARISON:  03/03/2020 FINDINGS: The heart size and mediastinal contours are within normal limits. Both lungs are clear. The visualized skeletal structures are unremarkable. IMPRESSION: No active cardiopulmonary disease. Electronically Signed   By: Alcide Clever M.D.   On: 11/30/2021 19:26     Assessment and Plan :   PDMP not reviewed this encounter.  1. Chest wall pain   2. Mild persistent asthma without complication    Patient declined any further testing.  Recommended managing conservatively for musculoskeletal pain with naproxen, tizanidine. Counseled patient  on potential for adverse effects with medications prescribed/recommended today, ER and return-to-clinic precautions discussed, patient verbalized understanding.    Wallis Bamberg, PA-C 11/30/21 1935

## 2022-01-04 ENCOUNTER — Other Ambulatory Visit: Payer: Self-pay

## 2022-01-04 ENCOUNTER — Emergency Department (HOSPITAL_BASED_OUTPATIENT_CLINIC_OR_DEPARTMENT_OTHER)
Admission: EM | Admit: 2022-01-04 | Discharge: 2022-01-04 | Disposition: A | Discharge status: Home / Self Care | Payer: Federal, State, Local not specified - PPO | Attending: Emergency Medicine | Admitting: Emergency Medicine

## 2022-01-04 ENCOUNTER — Encounter (HOSPITAL_BASED_OUTPATIENT_CLINIC_OR_DEPARTMENT_OTHER): Payer: Self-pay

## 2022-01-04 DIAGNOSIS — J069 Acute upper respiratory infection, unspecified: Secondary | ICD-10-CM | POA: Diagnosis not present

## 2022-01-04 DIAGNOSIS — J45901 Unspecified asthma with (acute) exacerbation: Secondary | ICD-10-CM

## 2022-01-04 DIAGNOSIS — R0981 Nasal congestion: Secondary | ICD-10-CM | POA: Diagnosis not present

## 2022-01-04 DIAGNOSIS — U071 COVID-19: Secondary | ICD-10-CM | POA: Insufficient documentation

## 2022-01-04 LAB — RESP PANEL BY RT-PCR (FLU A&B, COVID) ARPGX2
Influenza A by PCR: NEGATIVE
Influenza B by PCR: NEGATIVE
SARS Coronavirus 2 by RT PCR: POSITIVE — AB

## 2022-01-04 MED ORDER — PREDNISONE 20 MG PO TABS: 40.0000 mg | ORAL_TABLET | Freq: Every day | ORAL | 0 refills | Status: DC

## 2022-01-04 MED ORDER — NIRMATRELVIR/RITONAVIR (PAXLOVID)TABLET: 3.0000 | ORAL_TABLET | Freq: Two times a day (BID) | ORAL | 0 refills | Status: AC

## 2022-01-04 NOTE — ED Provider Notes (Signed)
[facility] EMERGENCY DEPARTMENT Provider Note   CSN: [id number] Arrival date & time: 01/04/22  1039     History  Chief Complaint  Patient presents with   URI    [facility] is a 41 y.o. male.  Patient with history of asthma presents to the emergency department today for evaluation of URI symptoms starting 2 days ago.  Patient has received 3 COVID vaccines.  He states that he has had increasing use of albuterol nebulizer at home due to chest tightness and shortness of breath.  He has had associated nasal congestion, runny nose, occasional cough and mild diarrhea.  No fevers.  No vomiting or chest pain.  No abdominal pain.  Course is constant.  He has tried several over-the-counter treatments without much improvement.  He states that typically he needs steroids when he has flare of his asthma.  The onset of this condition was acute. The course is constant. Aggravating factors: none. Alleviating factors: none.        Home Medications Prior to Admission medications   Medication Sig Start Date End Date Taking? Authorizing Provider  predniSONE (DELTASONE) 20 MG tablet Take 2 tablets (40 mg total) by mouth daily. 01/04/22  Yes Carlisle Cater, PA-C  acetaminophen (TYLENOL) 500 MG tablet Take 500 mg by mouth every 6 (six) hours as needed.    [provider]  albuterol (PROVENTIL) (2.5 MG/3ML) 0.083% nebulizer solution Take 3 mLs (2.5 mg total) by nebulization every 4 (four) hours as needed for wheezing or shortness of breath. 10/12/21   Martyn Ehrich, NP  albuterol (VENTOLIN HFA) 108 (90 Base) MCG/ACT inhaler TAKE 2 PUFFS BY MOUTH EVERY 6 HOURS AS NEEDED FOR WHEEZE OR SHORTNESS OF BREATH 10/12/21   Martyn Ehrich, NP  budesonide-formoterol [facility]) 80-4.5 MCG/ACT inhaler 2 puffs in the morning AS NEEDED right when you wake up, rinse out your mouth after use, Can repeat in 12 hours later 2 puffs AS NEEDED, rinse after use 10/12/21   Martyn Ehrich, NP   cetirizine (ZYRTEC) 10 MG tablet Take 1 tablet (10 mg total) by mouth daily as needed. 10/12/21   Martyn Ehrich, NP  ibuprofen (ADVIL) 200 MG tablet Take 200 mg by mouth every 6 (six) hours as needed.    [provider]  metoprolol succinate (TOPROL XL) 25 MG 24 hr tablet Take 1 tablet (25 mg total) by mouth daily. 06/22/18   Lorretta Harp, MD  Multiple Vitamins-Minerals (CENTRUM MEN PO) Take by mouth daily. gummy    [provider]  naproxen (NAPROSYN) 500 MG tablet Take 1 tablet (500 mg total) by mouth 2 (two) times daily with a meal. 11/30/21   Jaynee Eagles, PA-C  tiZANidine (ZANAFLEX) 4 MG tablet Take 1 tablet (4 mg total) by mouth at bedtime. 11/30/21   Jaynee Eagles, PA-C      Allergies    Patient has no known allergies.    Review of Systems   Review of Systems  Constitutional:  Negative for fever.  HENT:  Positive for congestion and rhinorrhea. Negative for sore throat.   Eyes:  Negative for redness.  Respiratory:  Positive for cough, chest tightness and shortness of breath. Negative for wheezing.   Cardiovascular:  Negative for chest pain.  Gastrointestinal:  Positive for diarrhea. Negative for abdominal pain, nausea and vomiting.  Genitourinary:  Negative for dysuria and hematuria.  Musculoskeletal:  Negative for myalgias.  Skin:  Negative for rash.  Neurological:  Negative for headaches.  Physical Exam Updated Vital Signs BP 140/74 (BP Location: Right Arm)    Pulse 72    Temp 98.3 F (36.8 C) (Oral)    Resp 20    Ht 6\' 3"  (1.905 m)    Wt 81.6 kg    SpO2 97%    BMI 22.50 kg/m  Physical Exam Vitals and nursing note reviewed.  Constitutional:      Appearance: He is well-developed.  HENT:     Head: Normocephalic and atraumatic.     Jaw: No trismus.     Right Ear: Tympanic membrane, ear canal and external ear normal.     Left Ear: Tympanic membrane, ear canal and external ear normal.     Nose: Congestion and rhinorrhea present. No mucosal edema.      Mouth/Throat:     Mouth: Mucous membranes are moist. Mucous membranes are not dry.     Pharynx: Uvula midline. No oropharyngeal exudate, posterior oropharyngeal erythema or uvula swelling.     Tonsils: No tonsillar abscesses.  Eyes:     General:        Right eye: No discharge.        Left eye: No discharge.     Conjunctiva/sclera: Conjunctivae normal.  Cardiovascular:     Rate and Rhythm: Normal rate and regular rhythm.     Heart sounds: Normal heart sounds.  Pulmonary:     Effort: Pulmonary effort is normal. No respiratory distress.     Breath sounds: Normal breath sounds. No wheezing or rales.     Comments: No wheezing at time of exam Abdominal:     Palpations: Abdomen is soft.     Tenderness: There is no abdominal tenderness.  Musculoskeletal:     Cervical back: Normal range of motion and neck supple.  Skin:    General: Skin is warm and dry.  Neurological:     Mental Status: He is alert.    ED Results / Procedures / Treatments   Labs (all labs ordered are listed, but only abnormal results are displayed) Labs Reviewed  RESP PANEL BY RT-PCR (FLU A&B, COVID) ARPGX2    EKG None  Radiology No results found.  Procedures Procedures    Medications Ordered in ED Medications - No data to display  ED Course/ Medical Decision Making/ A&P    Patient seen and examined. Plan discussed with patient.   Labs: COVID and flu testing  Imaging: None  Medications/Fluids: Prescription for prednisone sent in, encourage continued use of home albuterol and OTC meds as desired  Vital signs reviewed and are as follows: BP 140/74 (BP Location: Right Arm)    Pulse 72    Temp 98.3 F (36.8 C) (Oral)    Resp 20    Ht 6\' 3"  (1.905 m)    Wt 81.6 kg    SpO2 97%    BMI 22.50 kg/m   Initial impression: URI with asthma exacerbation                          Medical Decision Making  Patient with history of asthma with URI symptoms.  Will rule out flu and COVID.  Currently symptoms are  mild.  No concern for strep throat, otitis, pneumonia or other etiology which would require antibiotics at this time.  Vital signs reviewed without hypoxia, tachypnea or tachycardia.        Final Clinical Impression(s) / ED Diagnoses Final diagnoses:  Upper respiratory tract infection, unspecified type  Exacerbation of asthma, unspecified asthma severity, unspecified whether persistent    Rx / DC Orders ED Discharge Orders          Ordered    predniSONE (DELTASONE) 20 MG tablet  Daily        01/04/22 1108              Carlisle Cater, PA-C 01/04/22 1117    Lennice Sites, DO 01/04/22 1405

## 2022-01-04 NOTE — Discharge Instructions (Signed)
Please read and follow all provided instructions.  Your diagnoses today include:  1. Upper respiratory tract infection, unspecified type   2. Exacerbation of asthma, unspecified asthma severity, unspecified whether persistent     You appear to have an upper respiratory infection (URI). An upper respiratory tract infection, or cold, is a viral infection of the air passages leading to the lungs. It should improve gradually after 5-7 days. You may have a lingering cough that lasts for 2- 4 weeks after the infection.  Tests performed today include: Vital signs. See below for your results today.  COVID and flu testing: You will be notified with positive result, results will be available in MyChart in a couple of hours  Medications prescribed:  Prednisone - steroid medicine   It is best to take this medication in the morning to prevent sleeping problems. If you are diabetic, monitor your blood sugar closely and stop taking Prednisone if blood sugar is over 300. Take with food to prevent stomach upset.   Take any prescribed medications only as directed. Treatment for your infection is aimed at treating the symptoms. There are no medications, such as antibiotics, that will cure your infection.   Home care instructions:  You can take Tylenol and/or Ibuprofen as directed on the packaging for fever reduction and pain relief.    For cough: honey 1/2 to 1 teaspoon (you can dilute the honey in water or another fluid).  You can also use guaifenesin and dextromethorphan for cough. You can use a humidifier for chest congestion and cough.  If you don't have a humidifier, you can sit in the bathroom with the hot shower running.      For sore throat: try warm salt water gargles, cepacol lozenges, throat spray, warm tea or water with lemon/honey, popsicles or ice, or OTC cold relief medicine for throat discomfort.    For congestion: take a daily anti-histamine like Zyrtec, Claritin, and a oral decongestant,  such as pseudoephedrine.  You can also use Flonase 1-2 sprays in each nostril daily.    It is important to stay hydrated: drink plenty of fluids (water, gatorade/powerade/pedialyte, juices, or teas) to keep your throat moisturized and help further relieve irritation/discomfort.   Your illness is contagious and can be spread to others, especially during the first 3 or 4 days. It cannot be cured by antibiotics or other medicines. Take basic precautions such as washing your hands often, covering your mouth when you cough or sneeze, and avoiding public places where you could spread your illness to others.   Please continue drinking plenty of fluids.  Use over-the-counter medicines as needed as directed on packaging for symptom relief.  You may also use ibuprofen or tylenol as directed on packaging for pain or fever.  Do not take multiple medicines containing Tylenol or acetaminophen to avoid taking too much of this medication.  Follow-up instructions: Please follow-up with your primary care provider in the next 3 days for further evaluation of your symptoms if you are not feeling better.   Return instructions:  Please return to the Emergency Department if you experience worsening symptoms.  RETURN IMMEDIATELY IF you develop shortness of breath, confusion or altered mental status, a new rash, become dizzy, faint, or poorly responsive, or are unable to be cared for at home. Please return if you have persistent vomiting and cannot keep down fluids or develop a fever that is not controlled by tylenol or motrin.   Please return if you have any other emergent  concerns.  Additional Information:  Your vital signs today were: BP 140/74 (BP Location: Right Arm)    Pulse 72    Temp 98.3 F (36.8 C) (Oral)    Resp 20    Ht 6\' 3"  (1.905 m)    Wt 81.6 kg    SpO2 97%    BMI 22.50 kg/m  If your blood pressure (BP) was elevated above 135/85 this visit, please have this repeated by your doctor within one  month. --------------

## 2022-01-04 NOTE — ED Triage Notes (Signed)
Pt started with congestion, runny nose, cough, diarrhea on Sunday. Hx of asthma, states took nebulizer this morning.

## 2022-01-06 ENCOUNTER — Telehealth: Payer: Self-pay | Admitting: Adult Health

## 2022-01-06 NOTE — Telephone Encounter (Signed)
Called pt no answer. Unaware who mother is and if he is on her DPR as mother information was not provided. Will try to call back tomorrow.

## 2022-01-06 NOTE — Telephone Encounter (Signed)
Patient called because his mother was in for an appointment earlier today and stated that Kandee Keen had told her to have patient give him a call. I let him know that CMA was unavailable and he would receive a call when someone was available     Please advise

## 2022-01-07 ENCOUNTER — Encounter: Payer: Self-pay | Admitting: Family Medicine

## 2022-01-07 ENCOUNTER — Telehealth (INDEPENDENT_AMBULATORY_CARE_PROVIDER_SITE_OTHER): Payer: Federal, State, Local not specified - PPO | Admitting: Family Medicine

## 2022-01-07 VITALS — HR 70 | Ht 75.0 in | Wt 180.0 lb

## 2022-01-07 DIAGNOSIS — U071 COVID-19: Secondary | ICD-10-CM | POA: Diagnosis not present

## 2022-01-07 DIAGNOSIS — J4541 Moderate persistent asthma with (acute) exacerbation: Secondary | ICD-10-CM

## 2022-01-07 MED ORDER — DOXYCYCLINE HYCLATE 100 MG PO TABS: 100.0000 mg | ORAL_TABLET | Freq: Two times a day (BID) | ORAL | 0 refills | Status: AC

## 2022-01-07 NOTE — Patient Instructions (Signed)
Continue with isolation until 5 days after symptom onset. Ok to use over the counter medication to help with symptom control (like mucinex).   I suggest increasing your symbicort to 2 puffs twice daily.   I would suggest only taking the antibiotic (doxycycline) that I have sent in for you if your symptoms worsen significantly(sinus pain, pressure, fevers). Otherwise I would expect you to continue with some congestion for 2-3 weeks, but should gradually improve.

## 2022-01-07 NOTE — Progress Notes (Signed)
Virtual Visit via Video Note  I connected with Austin Curry  on 01/07/22 at  3:00 PM EST by a video enabled telemedicine application and verified that I am speaking with the correct person using two identifiers.  Location patient: home Location provider: [place], [place] [address] Persons participating in the virtual visit: patient, provider  I discussed the limitations of evaluation and management by telemedicine and the availability of in person appointments. The patient expressed understanding and agreed to proceed.   Austin Curry DOB: Aug 24, 1981 Encounter date: 01/07/2022  This is a 41 y.o. male who presents with Chief Complaint  Patient presents with   Nasal Congestion    History of present illness:  Started not feeling well on Sunday evening; felt like it was issues with asthma. Sunday evening took breathing treatment, Monday morning/evening took treatment. Tuesday took treatment and still didn't feel 100%. Went to ER. Testing for covid,flu showed covid+. Got results and has been dealing with sx since then. Has had runny nose here and there, but thought just regular sinuses. Confined to house since Tuesday afternoon. Did start the paxlovid on Tuesday.   Did start prednisone as well; this was given in ER. Breathing feels well overall; no treatments since Tuesday. Just more out of breath with activity.   Chest and nasal congestion are biggest concerns right now. Worried that he has sinus infection on top of everything.   Really felt like sinuses were more of issue on Monday. Yesterday was running more.   Has had covid in the past - almost a year ago.   No Known Allergies Current Meds  Medication Sig   acetaminophen (TYLENOL) 500 MG tablet Take 500 mg by mouth every 6 (six) hours as needed.   albuterol (PROVENTIL) (2.5 MG/3ML) 0.083% nebulizer solution Take 3 mLs (2.5 mg total) by nebulization every 4 (four) hours as needed for  wheezing or shortness of breath.   albuterol (VENTOLIN HFA) 108 (90 Base) MCG/ACT inhaler TAKE 2 PUFFS BY MOUTH EVERY 6 HOURS AS NEEDED FOR WHEEZE OR SHORTNESS OF BREATH   budesonide-formoterol (SYMBICORT) 80-4.5 MCG/ACT inhaler 2 puffs in the morning AS NEEDED right when you wake up, rinse out your mouth after use, Can repeat in 12 hours later 2 puffs AS NEEDED, rinse after use   cetirizine (ZYRTEC) 10 MG tablet Take 1 tablet (10 mg total) by mouth daily as needed.   ibuprofen (ADVIL) 200 MG tablet Take 200 mg by mouth every 6 (six) hours as needed.   metoprolol succinate (TOPROL XL) 25 MG 24 hr tablet Take 1 tablet (25 mg total) by mouth daily.   Multiple Vitamins-Minerals (CENTRUM MEN PO) Take by mouth daily. gummy   naproxen (NAPROSYN) 500 MG tablet Take 1 tablet (500 mg total) by mouth 2 (two) times daily with a meal.   nirmatrelvir/ritonavir EUA (PAXLOVID) 20 x 150 MG & 10 x 100MG  TABS Take 3 tablets by mouth 2 (two) times daily for 5 days. Patient GFR is >60. Take nirmatrelvir (150 mg) two tablets twice daily for 5 days and ritonavir (100 mg) one tablet twice daily for 5 days.   predniSONE (DELTASONE) 20 MG tablet Take 2 tablets (40 mg total) by mouth daily.   tiZANidine (ZANAFLEX) 4 MG tablet Take 1 tablet (4 mg total) by mouth at bedtime.    Review of Systems  Constitutional:  Negative for chills and fever.  HENT:  Positive for congestion and postnasal drip. Negative  for ear pain, sinus pressure, sinus pain and sore throat.   Respiratory:  Positive for cough. Negative for shortness of breath and wheezing.   Cardiovascular:  Negative for chest pain.   Objective:  Pulse 70    Ht 6\' 3"  (1.905 m)    Wt 180 lb (81.6 kg)    SpO2 95%    BMI 22.50 kg/m   Weight: 180 lb (81.6 kg)   BP Readings from Last 3 Encounters:  01/04/22 140/74  11/30/21 118/86  10/12/21 116/64   Wt Readings from Last 3 Encounters:  01/07/22 180 lb (81.6 kg)  01/04/22 180 lb (81.6 kg)  10/12/21 176 lb 9.6 oz  (80.1 kg)    EXAM:  GENERAL: alert, oriented, appears well and in no acute distress  HEENT: atraumatic, conjunctiva clear, no obvious abnormalities on inspection of external nose and ears  NECK: normal movements of the head and neck  LUNGS: on inspection no signs of respiratory distress, breathing rate appears normal, no obvious gross SOB, gasping or wheezing  CV: no obvious cyanosis  MS: moves all visible extremities without noticeable abnormality  PSYCH/NEURO: pleasant and cooperative, no obvious depression or anxiety, speech and thought processing grossly intact   Assessment/Plan  1. COVID Continue with isolation until Monday. Complete the paxlovid as directed.increase symbicort to 2 puffs twice daily.   2. Moderate persistent asthma with exacerbation Complete prednisone. Bump up symbicort to 2 puffs BID, albuterol prn.      I discussed the assessment and treatment plan with the patient. The patient was provided an opportunity to ask questions and all were answered. The patient agreed with the plan and demonstrated an understanding of the instructions.   The patient was advised to call back or seek an in-person evaluation if the symptoms worsen or if the condition fails to improve as anticipated.  I provided 20 minutes of face-to-face time during this encounter.   Micheline Rough, MD

## 2022-01-07 NOTE — Telephone Encounter (Signed)
Pt was wanting an appt. For sinus issue with his Covid. Pt has been scheduled for VV.

## 2022-01-20 DIAGNOSIS — M25561 Pain in right knee: Secondary | ICD-10-CM | POA: Diagnosis not present

## 2022-04-14 ENCOUNTER — Other Ambulatory Visit: Payer: Self-pay | Admitting: Primary Care

## 2022-04-14 DIAGNOSIS — J4541 Moderate persistent asthma with (acute) exacerbation: Secondary | ICD-10-CM

## 2022-05-19 DIAGNOSIS — H1045 Other chronic allergic conjunctivitis: Secondary | ICD-10-CM | POA: Diagnosis not present

## 2022-05-19 DIAGNOSIS — J301 Allergic rhinitis due to pollen: Secondary | ICD-10-CM | POA: Diagnosis not present

## 2022-05-19 DIAGNOSIS — J4540 Moderate persistent asthma, uncomplicated: Secondary | ICD-10-CM | POA: Diagnosis not present

## 2022-07-08 DIAGNOSIS — J301 Allergic rhinitis due to pollen: Secondary | ICD-10-CM | POA: Diagnosis not present

## 2022-07-08 DIAGNOSIS — J3081 Allergic rhinitis due to animal (cat) (dog) hair and dander: Secondary | ICD-10-CM | POA: Diagnosis not present

## 2022-07-08 DIAGNOSIS — J4540 Moderate persistent asthma, uncomplicated: Secondary | ICD-10-CM | POA: Diagnosis not present

## 2022-07-08 DIAGNOSIS — J3089 Other allergic rhinitis: Secondary | ICD-10-CM | POA: Diagnosis not present

## 2022-08-03 ENCOUNTER — Encounter: Payer: Self-pay | Admitting: Adult Health

## 2022-08-03 ENCOUNTER — Ambulatory Visit: Payer: Federal, State, Local not specified - PPO | Admitting: Adult Health

## 2022-08-03 VITALS — BP 120/82 | HR 76 | Temp 98.6°F | Ht 75.0 in | Wt 169.0 lb

## 2022-08-03 DIAGNOSIS — R519 Headache, unspecified: Secondary | ICD-10-CM

## 2022-08-03 DIAGNOSIS — R0683 Snoring: Secondary | ICD-10-CM

## 2022-08-03 DIAGNOSIS — F419 Anxiety disorder, unspecified: Secondary | ICD-10-CM | POA: Diagnosis not present

## 2022-08-03 DIAGNOSIS — F32A Depression, unspecified: Secondary | ICD-10-CM

## 2022-08-03 DIAGNOSIS — R5383 Other fatigue: Secondary | ICD-10-CM

## 2022-08-03 DIAGNOSIS — G4730 Sleep apnea, unspecified: Secondary | ICD-10-CM

## 2022-08-03 MED ORDER — BUPROPION HCL ER (SR) 150 MG PO TB12: 150.0000 mg | ORAL_TABLET | Freq: Two times a day (BID) | ORAL | 1 refills | Status: DC

## 2022-08-03 MED ORDER — ONDANSETRON HCL 4 MG PO TABS: 4.0000 mg | ORAL_TABLET | Freq: Three times a day (TID) | ORAL | 0 refills | Status: DC | PRN
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
## 2022-08-13 ENCOUNTER — Emergency Department (HOSPITAL_BASED_OUTPATIENT_CLINIC_OR_DEPARTMENT_OTHER)
Admission: EM | Admit: 2022-08-13 | Discharge: 2022-08-13 | Disposition: A | Discharge status: Home / Self Care | Attending: Emergency Medicine | Admitting: Emergency Medicine

## 2022-08-13 ENCOUNTER — Other Ambulatory Visit: Payer: Self-pay

## 2022-08-13 ENCOUNTER — Emergency Department (HOSPITAL_BASED_OUTPATIENT_CLINIC_OR_DEPARTMENT_OTHER): Payer: Federal, State, Local not specified - PPO

## 2022-08-13 DIAGNOSIS — W230XXA Caught, crushed, jammed, or pinched between moving objects, initial encounter: Secondary | ICD-10-CM | POA: Insufficient documentation

## 2022-08-13 DIAGNOSIS — I10 Essential (primary) hypertension: Secondary | ICD-10-CM | POA: Diagnosis not present

## 2022-08-13 DIAGNOSIS — S6992XA Unspecified injury of left wrist, hand and finger(s), initial encounter: Secondary | ICD-10-CM | POA: Insufficient documentation

## 2022-08-13 DIAGNOSIS — Y990 Civilian activity done for income or pay: Secondary | ICD-10-CM | POA: Diagnosis not present

## 2022-08-13 MED ORDER — MELOXICAM 7.5 MG PO TABS: 7.5000 mg | ORAL_TABLET | Freq: Every day | ORAL | 0 refills | Status: DC

## 2022-08-13 NOTE — ED Triage Notes (Signed)
Left hand injury at work , obvious left hand swelling . Limited ROM .

## 2022-08-13 NOTE — ED Provider Notes (Signed)
[facility] EMERGENCY DEPARTMENT Provider Note   CSN: [id number] Arrival date & time: 08/13/22  1006     History  Chief Complaint  Patient presents with   Hand Injury    left    Austin Curry is a 41 y.o. male who reports a history of hypertension otherwise healthy presented for evaluation of left middle finger injury.  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  Patient reports this "jammed" his left middle finger and he had severe immediate pain, he has been using ibuprofen with minimal relief.  Pain is associated with swelling of the right middle finger which extends down to the third MCP and dorsum of the hand.  Pain worsens with movement and palpation.  Patient denies numbness, tingling or any additional injuries or concerns.  HPI     Home Medications Prior to Admission medications   Medication Sig Start Date End Date Taking? Authorizing Provider  meloxicam (MOBIC) 7.5 MG tablet Take 1 tablet (7.5 mg total) by mouth daily. 08/13/22  Yes Harlene Salts A, PA-C  acetaminophen (TYLENOL) 500 MG tablet Take 500 mg by mouth every 6 (six) hours as needed.    [provider]  albuterol (PROVENTIL) (2.5 MG/3ML) 0.083% nebulizer solution Take 3 mLs (2.5 mg total) by nebulization every 4 (four) hours as needed for wheezing or shortness of breath. 10/12/21   Glenford Bayley, NP  albuterol (VENTOLIN HFA) 108 (90 Base) MCG/ACT inhaler TAKE 2 PUFFS BY MOUTH EVERY 6 HOURS AS NEEDED FOR WHEEZE OR SHORTNESS OF BREATH 04/14/22   Glenford Bayley, NP  budesonide-formoterol [facility]) 80-4.5 MCG/ACT inhaler 2 puffs in the morning AS NEEDED right when you wake up, rinse out your mouth after use, Can repeat in 12 hours later 2 puffs AS NEEDED, rinse after use 10/12/21   Glenford Bayley, NP  buPROPion (WELLBUTRIN SR) 150 MG 12  hr tablet Take 1 tablet (150 mg total) by mouth 2 (two) times daily. 08/03/22   Nafziger, Kandee Keen, NP  cetirizine (ZYRTEC) 10 MG tablet Take 1 tablet (10 mg total) by mouth daily as needed. 10/12/21   Glenford Bayley, NP  Multiple Vitamins-Minerals (CENTRUM MEN PO) Take by mouth daily. gummy    [provider]  ondansetron (ZOFRAN) 4 MG tablet Take 1 tablet (4 mg total) by mouth every 8 (eight) hours as needed for nausea or vomiting. 08/03/22   Shirline Frees, NP      Allergies    Patient has no known allergies.    Review of Systems   Review of Systems   Physical Exam Updated Vital Signs BP 118/74   Pulse (!) 57   Temp 98.1 F (36.7 C) (Oral)   Resp 16   Ht 6\' 3"  (1.905 m)   Wt 77.6 kg   SpO2 99%   BMI 21.37 kg/m  Physical Exam Constitutional:      General: He is not in acute distress.    Appearance: Normal appearance. He is well-developed. He is not ill-appearing or diaphoretic.  HENT:     Head: Normocephalic and atraumatic.  Eyes:     General: Vision grossly intact. Gaze aligned appropriately.     Pupils: Pupils are equal, round, and reactive to light.  Neck:     Trachea: Trachea and phonation normal.  Pulmonary:  Effort: Pulmonary effort is normal. No respiratory distress.  Abdominal:     General: There is no distension.     Palpations: Abdomen is soft.     Tenderness: There is no abdominal tenderness. There is no guarding or rebound.  Musculoskeletal:        General: Normal range of motion.     Cervical back: Normal range of motion.     Comments: Left hand: Mild swelling extending from the PIP of the left middle finger down through the MCP and involving the distal dorsal aspect of the left third metacarpal.  No gross deformities, skin intact.  Other fingers appear normal.  Left wrist forearm and elbow normal in appearance. Tenderness to palpation extending from the DIP of the left third finger down through the MCP of the left third finger.  No tenderness of  the other fingers.  No tenderness along the carpal bones, metacarpals or distal radius/ulna.  No tenderness at the snuffbox. Patient retains full range of motion at the thumb, index, ring and small finger without pain.  Patient has limited range of motion of the left third finger due to pain, resisted flexion and extension is intact.  Patient is able to flex and extend at the MCP, PIP and DIP with increased pain and limited range of motion. Strong equal radial pulse.  Compartments soft.  Sensation intact to all fingers.  Skin:    General: Skin is warm and dry.  Neurological:     Mental Status: He is alert.     GCS: GCS eye subscore is 4. GCS verbal subscore is 5. GCS motor subscore is 6.     Comments: Speech is clear and goal oriented, follows commands Major Cranial nerves without deficit, no facial droop Moves extremities without ataxia, coordination intact  Psychiatric:        Behavior: Behavior normal.     ED Results / Procedures / Treatments   Labs (all labs ordered are listed, but only abnormal results are displayed) Labs Reviewed - No data to display  EKG None  Radiology DG Hand Complete Left  Result Date: 08/13/2022 CLINICAL DATA:  41 year old male status post injury at work. EXAM: LEFT HAND - COMPLETE 3+ VIEW COMPARISON:  None Available. FINDINGS: Bone mineralization is within normal limits. There is no evidence of fracture or dislocation. There is no evidence of arthropathy or other focal bone abnormality. Soft tissue swelling most apparent in the 3rd finger. No radiopaque foreign body identified. No soft tissue gas. IMPRESSION: Soft tissue swelling with no acute fracture or dislocation identified in the left hand. Electronically Signed   By: Odessa Fleming M.D.   On: 08/13/2022 11:20    Procedures Procedures    Medications Ordered in ED Medications - No data to display  ED Course/ Medical Decision Making/ A&P Clinical Course as of 08/13/22 1207  Sat Aug 13, 2022  1157 DG  Hand Complete Left I have personally reviewed and interpreted patient's three-view x-ray of the left hand: I do not appreciate any obvious acute fracture or dislocation.  Patient does have soft tissue swelling of the third finger. [BM]    Clinical Course User Index [BM] Bill Salinas, PA-C                           Medical Decision Making 41 year old male presented for left third finger injury that occurred 3 days ago.  Patient reports he was opening a mailbox when he "jammed"  his left middle finger he reports swelling and pain shortly after the injury.  On exam the third finger is tender and swollen from the DIP down through the MCP.  He has no tenderness along the metacarpal.  He retains strength with resisted flexion and extension he is able to flex and extend at the DIP, PIP and MCP with increased pain.  There is no evidence of infection on exam.  X-rays today do not reveal any obvious acute fracture or dislocation but does show the soft tissue swelling.  Suspect patient suffered sprain of his finger. No evidence at this time for flexor tenosynovitis, fracture/dislocation, cellulitis, septic arthritis, compartment syndrome, neurovascular compromise or other emergent causes of his pain today.  I discussed treatment options with patient, shared decision making made.  Patient's fingers were buddy taped today, advised nonweightbearing until follow-up with hand specialist.  Work note was given.  Patient courage to call on-call hand specialist Dr. Blatt Butt office today to schedule follow-up appointment for early next week.  Patient will use rice therapy and OTC Tylenol.  Patient was given a prescription for meloxicam to help with his pain and swelling.  He denied history of CKD, gastric ulcers, blood thinner use or adverse reaction to NSAIDs in the past.  Patient is aware to avoid other NSAID use while taking meloxicam.  Amount and/or Complexity of Data Reviewed Radiology: ordered. Decision-making  details documented in ED Course.     At this time there does not appear to be any evidence of an acute emergency medical condition and the patient appears stable for discharge with appropriate outpatient follow up. Diagnosis was discussed with patient who verbalizes understanding of care plan and is agreeable to discharge. I have discussed return precautions with patient who verbalizes understanding. Patient encouraged to follow-up with their PCP and hand. All questions answered.    Note: Portions of this report may have been transcribed using voice recognition software. Every effort was made to ensure accuracy; however, inadvertent computerized transcription errors may still be present.         Final Clinical Impression(s) / ED Diagnoses Final diagnoses:  Injury of finger of left hand, initial encounter    Rx / DC Orders ED Discharge Orders          Ordered    meloxicam (MOBIC) 7.5 MG tablet  Daily        08/13/22 1201              Elizabeth Palau 08/13/22 1209    Alvira Monday, MD 08/14/22 2238

## 2022-08-13 NOTE — Discharge Instructions (Addendum)
At this time there does not appear to be the presence of an emergent medical condition, however there is always the potential for conditions to change. Please read and follow the below instructions.  Please return to the Emergency Department immediately for any new or worsening symptoms. Please be sure to follow up with your Primary Care Provider within one week regarding your visit today; please call their office to schedule an appointment even if you are feeling better for a follow-up visit. Please continue buddy taping your fingers for your support.  Do not lift push pull or perform any activities with your left hand until you are cleared to do so by the hand specialist. Please contact the on-call hand specialist Dr. Wallen Butt for further management of your finger injury.  Please use rest ice and elevation to help with your symptoms. You have been prescribed an NSAID-containing medication called Meloxicam today.  Do not take the medications including ibuprofen, Aleve, Advil or other NSAID-containing medications while taking Meloxicam .  Please be sure to drink enough water. You may also use over-the-counter Tylenol as directed on packaging to help with your symptoms.  Please read the additional information packets attached to your discharge summary.  Go to the nearest Emergency Department immediately if: You have fever or chills Your hand becomes warm, red, or swollen. Your hand is numb or tingling. Your hand is extremely swollen or deformed. Your hand or fingers turn white or blue. You cannot move your hand, wrist, or fingers. You have any new/concerning or worsening of symptoms.  Do not take your medicine if  develop an itchy rash, swelling in your mouth or lips, or difficulty breathing; call 911 and seek immediate emergency medical attention if this occurs.  You may review your lab tests and imaging results in their entirety on your MyChart account.  Please discuss all results of fully  with your primary care provider and other specialist at your follow-up visit.  Note: Portions of this text may have been transcribed using voice recognition software. Every effort was made to ensure accuracy; however, inadvertent computerized transcription errors may still be present.

## 2022-08-16 ENCOUNTER — Ambulatory Visit: Payer: Federal, State, Local not specified - PPO | Admitting: Orthopedic Surgery

## 2022-08-17 ENCOUNTER — Encounter: Payer: Self-pay | Admitting: Pulmonary Disease

## 2022-08-17 ENCOUNTER — Ambulatory Visit (INDEPENDENT_AMBULATORY_CARE_PROVIDER_SITE_OTHER): Payer: Federal, State, Local not specified - PPO | Admitting: Pulmonary Disease

## 2022-08-17 VITALS — BP 114/70 | HR 71 | Temp 97.9°F | Ht 75.0 in | Wt 185.8 lb

## 2022-08-17 DIAGNOSIS — R0683 Snoring: Secondary | ICD-10-CM | POA: Diagnosis not present

## 2022-08-17 DIAGNOSIS — G478 Other sleep disorders: Secondary | ICD-10-CM | POA: Diagnosis not present

## 2022-08-17 DIAGNOSIS — G4719 Other hypersomnia: Secondary | ICD-10-CM | POA: Diagnosis not present

## 2022-08-17 NOTE — Addendum Note (Signed)
Addended by: Arvilla Market on: 08/17/2022 04:02 PM   Modules accepted: Orders

## 2022-08-17 NOTE — Patient Instructions (Signed)
Moderate probability of significant obstructive sleep apnea  Schedule for home sleep study  We will contact you with results as soon as reviewed  Call us with significant concerns  Follow-up will be made following obtaining the sleep study

## 2022-08-17 NOTE — Progress Notes (Signed)
Austin Curry    [id number]    1981-09-04  Primary Care Physician:Nafziger, Kandee Keen, NP  Referring Physician: Shirline Frees, NP [address] [place],  [place] [address]  Chief complaint:   History of snoring, witnessed apneas, daytime sleepiness  HPI:  Longstanding history of snoring, witnessed apneas, daytime sleepiness  Usually goes to bed between 1030 and 12, sometimes takes up to 1 to 2 hours to fall asleep 1-2 awakenings Final wake up time between 520 and 5:30 AM  There is sleepiness during the day usually able to get out of bed in good time May take about 6 to 7 hours before he starts feeling sleepy again  Occasional dryness of his mouth in the mornings No morning headaches  No family history of obstructive sleep apnea known to him  Reformed smoker, social smoker in the past    Outpatient Encounter Medications as of 08/17/2022  Medication Sig   acetaminophen (TYLENOL) 500 MG tablet Take 500 mg by mouth every 6 (six) hours as needed.   albuterol (PROVENTIL) (2.5 MG/3ML) 0.083% nebulizer solution Take 3 mLs (2.5 mg total) by nebulization every 4 (four) hours as needed for wheezing or shortness of breath.   albuterol (VENTOLIN HFA) 108 (90 Base) MCG/ACT inhaler TAKE 2 PUFFS BY MOUTH EVERY 6 HOURS AS NEEDED FOR WHEEZE OR SHORTNESS OF BREATH   budesonide-formoterol (SYMBICORT) 80-4.5 MCG/ACT inhaler 2 puffs in the morning AS NEEDED right when you wake up, rinse out your mouth after use, Can repeat in 12 hours later 2 puffs AS NEEDED, rinse after use   buPROPion (WELLBUTRIN SR) 150 MG 12 hr tablet Take 1 tablet (150 mg total) by mouth 2 (two) times daily.   cetirizine (ZYRTEC) 10 MG tablet Take 1 tablet (10 mg total) by mouth daily as needed.   meloxicam (MOBIC) 7.5 MG tablet Take 1 tablet (7.5 mg total) by mouth daily.   Multiple Vitamins-Minerals (CENTRUM MEN PO) Take by mouth daily. gummy   ondansetron (ZOFRAN) 4 MG tablet Take 1 tablet (4 mg total) by  mouth every 8 (eight) hours as needed for nausea or vomiting.   No facility-administered encounter medications on file as of 08/17/2022.    Allergies as of 08/17/2022   (No Known Allergies)    Past Medical History:  Diagnosis Date   Environmental allergies    History of hypertension    Injury of ligament of right knee    right medial patellofemoral ligament tear   Intermittent palpitations 01-12-2021  per pt have been taking toprol since 09/ 2021 did not think he needed any more, only feel palpitations when lifting something heavy   previous seen by cardiology,  dr Allyson Sabal, lov 06-26-2019 pt was released prn basis;  event monitor 06-09-2018 showed ns/ st   Moderate persistent asthma    pulmonology---  b. Cherre Huger  NP  (01-12-2021 per pt last exacerbation approxl. 05/ 2021)    Past Surgical History:  Procedure Laterality Date   INSERTION OF MESH N/A 04/18/2016   Procedure: INSERTION OF MESH;  Surgeon: Abigail Miyamoto, MD;  Location: [facility];  Service: General;  Laterality: N/A;   KNEE ARTHROSCOPY WITH MEDIAL PATELLAR FEMORAL LIGAMENT RECONSTRUCTION Right 02/18/2021   Procedure: RIGHT KNEE ARTHROSCOPYLIMITED SYNOVECTOMY WITH MEDIAL PATELLAR FEMORAL LIGAMENT RECONSTRUCTION WITH HAMSTRING ALLOGRAFT;  Surgeon: Yolonda Kida, MD;  Location: [facility];  Service: Orthopedics;  Laterality: Right;  2 HRS   VENTRAL HERNIA REPAIR N/A 04/18/2016  Procedure:  VENTRAL HERNIA REPAIR;  Surgeon: Abigail Miyamoto, MD;  Location: [facility];  Service: General;  Laterality: N/A;    Family History  Problem Relation Age of Onset   Diabetes Mother    Hypertension Mother    CVA Mother    Hypercholesterolemia Mother    Hyperlipidemia Father    Asthma Brother    Asthma Brother     Social History   Socioeconomic History   Marital status: Married    Spouse name: Not on file   Number of children: Not on file   Years of education: Not on file   Highest education level: Not on file   Occupational History   Occupation: Mail Carrier     Employer: [employer]  Tobacco Use   Smoking status: Former    Packs/day: 0.50    Years: 15.00    Total pack years: 7.50    Types: Cigarettes    Quit date: 12/26/2021    Years since quitting: 0.6   Smokeless tobacco: Never  Vaping Use   Vaping Use: Never used  Substance and Sexual Activity   Alcohol use: Not Currently    Alcohol/week: 0.0 standard drinks of alcohol    Comment: occasional   Drug use: Never   Sexual activity: Not on file  Other Topics Concern   Not on file  Social History Narrative   Married    No children       Social Determinants of Health   Financial Resource Strain: Not on file  Food Insecurity: Not on file  Transportation Needs: Not on file  Physical Activity: Not on file  Stress: Not on file  Social Connections: Not on file  Intimate Partner Violence: Not on file    Review of Systems  Constitutional:  Positive for fatigue.  Psychiatric/Behavioral:  Positive for sleep disturbance.     Vitals:   08/17/22 1533  BP: 114/70  Pulse: 71  Temp: 97.9 F (36.6 C)  SpO2: 97%     Physical Exam Constitutional:      Appearance: Normal appearance.  HENT:     Head: Normocephalic.     Mouth/Throat:     Comments: Mallampati 3, relative macroglossia Cardiovascular:     Rate and Rhythm: Normal rate and regular rhythm.     Heart sounds: No murmur heard.    No friction rub.  Pulmonary:     Effort: No respiratory distress.     Breath sounds: No stridor. No wheezing.  Musculoskeletal:     Cervical back: No rigidity or tenderness.  Neurological:     Cranial Nerves: No cranial nerve deficit.       08/17/2022    3:00 PM  Results of the Epworth flowsheet  Sitting and reading 3  Watching TV 3  Sitting, inactive in a public place (e.g. a theatre or a meeting) 0  As a passenger in a car for an hour without a break 0  Lying down to rest in the afternoon when circumstances permit 2  Sitting and talking  to someone 2  Sitting quietly after a lunch without alcohol 2  In a car, while stopped for a few minutes in traffic 2  Total score 14     Data Reviewed: No previous sleep study  Assessment:  Patient with daytime sleepiness, witnessed apneas, Mallampati 3 with macroglossia  Moderate probability of significant obstructive sleep apnea  Pathophysiology of sleep disordered breathing reviewed Treatment options reviewed  Plan/Recommendations: Schedule patient for home sleep study  Update  patient with results as soon as available  Encouraged to get 6 to 8 hours of sleep nightly  If no significant sleep apnea then number of hours of sleep may be contributing to daytime sleepiness and fatigue  Follow-up following sleep study   Sherrilyn Rist MD [facility] 08/17/2022, 3:47 PM  CC: Dorothyann Peng, NP

## 2022-08-19 ENCOUNTER — Institutional Professional Consult (permissible substitution): Payer: Federal, State, Local not specified - PPO | Admitting: Primary Care

## 2022-08-25 ENCOUNTER — Encounter: Payer: Federal, State, Local not specified - PPO | Admitting: Adult Health

## 2022-09-06 ENCOUNTER — Encounter: Payer: Self-pay | Admitting: Adult Health

## 2022-09-06 ENCOUNTER — Ambulatory Visit (INDEPENDENT_AMBULATORY_CARE_PROVIDER_SITE_OTHER): Payer: Federal, State, Local not specified - PPO | Admitting: Adult Health

## 2022-09-06 VITALS — BP 110/80 | HR 86 | Temp 98.2°F | Ht 75.0 in | Wt 186.0 lb

## 2022-09-06 DIAGNOSIS — Z0000 Encounter for general adult medical examination without abnormal findings: Secondary | ICD-10-CM

## 2022-09-06 DIAGNOSIS — F419 Anxiety disorder, unspecified: Secondary | ICD-10-CM

## 2022-09-06 DIAGNOSIS — Z23 Encounter for immunization: Secondary | ICD-10-CM | POA: Diagnosis not present

## 2022-09-06 DIAGNOSIS — J4541 Moderate persistent asthma with (acute) exacerbation: Secondary | ICD-10-CM

## 2022-09-06 DIAGNOSIS — F32A Depression, unspecified: Secondary | ICD-10-CM | POA: Diagnosis not present

## 2022-09-06 DIAGNOSIS — Z9889 Other specified postprocedural states: Secondary | ICD-10-CM | POA: Diagnosis not present

## 2022-09-06 LAB — COMPREHENSIVE METABOLIC PANEL
ALT: 18 U/L (ref 0–53)
AST: 20 U/L (ref 0–37)
Albumin: 4 g/dL (ref 3.5–5.2)
Alkaline Phosphatase: 57 U/L (ref 39–117)
BUN: 15 mg/dL (ref 6–23)
CO2: 30 mEq/L (ref 19–32)
Calcium: 9.5 mg/dL (ref 8.4–10.5)
Chloride: 103 mEq/L (ref 96–112)
Creatinine, Ser: 1.14 mg/dL (ref 0.40–1.50)
GFR: 80.36 mL/min (ref >60.00)
Glucose, Bld: 80 mg/dL (ref 70–99)
Potassium: 4.1 mEq/L (ref 3.5–5.1)
Sodium: 140 mEq/L (ref 135–145)
Total Bilirubin: 0.5 mg/dL (ref 0.2–1.2)
Total Protein: 6.9 g/dL (ref 6.0–8.3)

## 2022-09-06 LAB — LIPID PANEL
Cholesterol: 226 mg/dL — ABNORMAL HIGH (ref 0–200)
HDL: 53.7 mg/dL (ref >39.00)
LDL Cholesterol: 144 mg/dL — ABNORMAL HIGH (ref 0–99)
NonHDL: 172.29
Total CHOL/HDL Ratio: 4
Triglycerides: 139 mg/dL (ref 0.0–149.0)
VLDL: 27.8 mg/dL (ref 0.0–40.0)

## 2022-09-06 LAB — CBC WITH DIFFERENTIAL/PLATELET
Basophils Absolute: 0 10*3/uL (ref 0.0–0.1)
Basophils Relative: 0.7 % (ref 0.0–3.0)
Eosinophils Absolute: 0.1 10*3/uL (ref 0.0–0.7)
Eosinophils Relative: 2.1 % (ref 0.0–5.0)
HCT: 43.7 % (ref 39.0–52.0)
Hemoglobin: 14.5 g/dL (ref 13.0–17.0)
Lymphocytes Relative: 28.4 % (ref 12.0–46.0)
Lymphs Abs: 1.3 10*3/uL (ref 0.7–4.0)
MCHC: 33.2 g/dL (ref 30.0–36.0)
MCV: 87.1 fl (ref 78.0–100.0)
Monocytes Absolute: 0.3 10*3/uL (ref 0.1–1.0)
Monocytes Relative: 6.3 % (ref 3.0–12.0)
Neutro Abs: 2.8 10*3/uL (ref 1.4–7.7)
Neutrophils Relative %: 62.5 % (ref 43.0–77.0)
Platelets: 170 10*3/uL (ref 150.0–400.0)
RBC: 5.02 Mil/uL (ref 4.22–5.81)
RDW: 13.7 % (ref 11.5–15.5)
WBC: 4.5 10*3/uL (ref 4.0–10.5)

## 2022-09-06 LAB — TSH: TSH: 0.43 u[IU]/mL (ref 0.35–5.50)

## 2022-09-06 MED ORDER — BUPROPION HCL ER (SR) 150 MG PO TB12: 150.0000 mg | ORAL_TABLET | Freq: Two times a day (BID) | ORAL | 1 refills | Status: DC
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
## 2022-10-06 ENCOUNTER — Ambulatory Visit: Payer: Federal, State, Local not specified - PPO

## 2022-10-06 DIAGNOSIS — R0683 Snoring: Secondary | ICD-10-CM

## 2022-10-06 DIAGNOSIS — G4719 Other hypersomnia: Secondary | ICD-10-CM

## 2022-10-06 DIAGNOSIS — G478 Other sleep disorders: Secondary | ICD-10-CM

## 2022-10-17 ENCOUNTER — Ambulatory Visit: Payer: Federal, State, Local not specified - PPO

## 2022-10-18 ENCOUNTER — Ambulatory Visit (INDEPENDENT_AMBULATORY_CARE_PROVIDER_SITE_OTHER): Payer: Federal, State, Local not specified - PPO

## 2022-10-28 ENCOUNTER — Telehealth: Payer: Self-pay | Admitting: Pulmonary Disease

## 2022-10-28 DIAGNOSIS — R0683 Snoring: Secondary | ICD-10-CM

## 2022-10-28 NOTE — Telephone Encounter (Signed)
Call patient  Sleep study result  Date of study: 10/18/2022  Impression: Negative study for significant sleep disordered breathing Study limited by data acquisition errors  Recommendation:  Recommend in lab sleep study for definitive evaluation  Nasal sensor not recording data for good amount of the sleep may have lead to falsely low number of events  Follow-up as previously scheduled

## 2022-10-31 NOTE — Telephone Encounter (Signed)
Called and spoke with patient, advised of results/recommendations per Dr. Ander Slade.  He was agreeable to doing an in lab sleep study.  In lab sleep study ordered.  Nothing further needed.

## 2022-11-21 ENCOUNTER — Telehealth: Payer: Self-pay | Admitting: Primary Care

## 2022-11-22 NOTE — Telephone Encounter (Signed)
Called and spoke with pt about the HST he had done and also that AO had wanted him to have an in-lab study performed. Asked pt if he ever had that study performed and he stated that he had not.  Stated to pt that I would check with [facility] about this to see if they might have any update on when the in-lab study will be scheduled.  Routing to [employer].

## 2023-01-27 ENCOUNTER — Ambulatory Visit (HOSPITAL_BASED_OUTPATIENT_CLINIC_OR_DEPARTMENT_OTHER): Payer: Federal, State, Local not specified - PPO | Attending: Pulmonary Disease | Admitting: Pulmonary Disease

## 2023-01-27 VITALS — Ht 75.0 in | Wt 185.0 lb

## 2023-01-27 DIAGNOSIS — R0683 Snoring: Secondary | ICD-10-CM

## 2023-01-27 DIAGNOSIS — I10 Essential (primary) hypertension: Secondary | ICD-10-CM | POA: Diagnosis not present

## 2023-01-27 DIAGNOSIS — I493 Ventricular premature depolarization: Secondary | ICD-10-CM | POA: Insufficient documentation

## 2023-01-27 DIAGNOSIS — R5383 Other fatigue: Secondary | ICD-10-CM | POA: Insufficient documentation

## 2023-01-27 HISTORY — DX: Snoring: R06.83

## 2023-02-10 ENCOUNTER — Telehealth: Payer: Self-pay | Admitting: Pulmonary Disease

## 2023-02-10 DIAGNOSIS — R0683 Snoring: Secondary | ICD-10-CM

## 2023-02-10 DIAGNOSIS — J4541 Moderate persistent asthma with (acute) exacerbation: Secondary | ICD-10-CM

## 2023-02-10 NOTE — Telephone Encounter (Signed)
Call patient  Sleep study result  Date of study: 01/27/2023  Impression: Negative for significant sleep disordered breathing Snoring  Recommendation:  Sleep position modification, avoiding supine sleep as possible.  May consider surgical options for the management of snoring, and oral device for the management of snoring  Measures to optimize sleep quality  Follow-up as scheduled

## 2023-02-10 NOTE — Procedures (Signed)
POLYSOMNOGRAPHY  Last, First: Austin Curry, Austin Curry MRN: [id number] Gender: Male Age (years): 74 Weight (lbs): 185 DOB: 1981/09/24 BMI: 23 Primary Care: No PCP Epworth Score: 16 Referring: Laurin Coder MD Technician: Jorge Ny Interpreting: Laurin Coder MD Study Type: NPSG Ordered Study Type: NPSG Study date: 01/27/2023 Location: [place] CLINICAL INFORMATION Austin Curry is a 42 year old Male and was referred to the sleep center for evaluation of G47.80 Other Sleep Disorders. Indications include Daytime Fatigue, Fatigue, Hypertension, Snoring, Witnesses Apnea / Gasping During Sleep.  MEDICATIONS Patient self administered medications include: N/A. Medications administered during study include No sleep medicine administered.  SLEEP STUDY TECHNIQUE A multi-channel overnight Polysomnography study was performed. The channels recorded and monitored were central and occipital EEG, electrooculogram (EOG), submentalis EMG (chin), nasal and oral airflow, thoracic and abdominal wall motion, anterior tibialis EMG, snore microphone, electrocardiogram, and a pulse oximetry. TECHNICIAN COMMENTS Comments added by Technician: Patient had difficulty initiating sleep. Patient was restless all through the night. Comments added by Scorer: N/A SLEEP ARCHITECTURE The study was initiated at 9:31:48 PM and terminated at 5:14:48 AM. The total recorded time was 463 minutes. EEG confirmed total sleep time was 393.5 minutes yielding a sleep efficiency of 85.0%. Sleep onset after lights out was 50.2 minutes with a REM latency of 38.0 minutes. The patient spent 3.9% of the night in stage N1 sleep, 75.3% in stage N2 sleep, 0.0% in stage N3 and 20.7% in REM. Wake after sleep onset (WASO) was 19.3 minutes. The Arousal Index was 13.1/hour. RESPIRATORY PARAMETERS There were a total of 3 respiratory disturbances out of which 0 were apneas ( 0 obstructive, 0 mixed, 0 central) and 3 hypopneas. The  apnea/hypopnea index (AHI) was 0.5 events/hour. The central sleep apnea index was 0 events/hour. The REM AHI was 2.2 events/hour and NREM AHI was 0.0 events/hour. The supine AHI was 0.0 events/hour and the non supine AHI was 0.5 supine during 14.99% of sleep. Respiratory disturbances were associated with oxygen desaturation down to a nadir of 89.0% during sleep. The mean oxygen saturation during the study was 93.0%. The cumulative time under 88% oxygen saturation was 5.5 minutes.  LEG MOVEMENT DATA The total leg movements were 13 with a resulting leg movement index of 2.0/hr .Associated arousal with leg movement index was 1.1/hr.  CARDIAC DATA The underlying cardiac rhythm was most consistent with sinus rhythm. Mean heart rate during sleep was 56.0 bpm. Additional rhythm abnormalities include PVCs.  IMPRESSIONS - No Significant Obstructive Sleep apnea(OSA) - Electrocardiographic data showed presence of PVCs. - Mild Oxygen Desaturation - The patient snored with moderate snoring volume. - No significant periodic leg movements(PLMs) during sleep. However, no significant associated arousals. - Delay in sleep onset, restlessness.  DIAGNOSIS - Negative for significant obstructive sleep apnea  RECOMMENDATIONS - Avoid alcohol, sedatives and other CNS depressants that may worsen sleep apnea and disrupt normal sleep architecture. - Sleep hygiene should be reviewed to assess factors that may improve sleep quality. - Sleep position modification should be considered to help snoring. May consider surgical options for the management of snoring. - Weight management and regular exercise should be initiated or continued.  [Electronically signed] 02/10/2023 05:33 AM  Austin Rist MD NPI: [id number]

## 2023-02-15 MED ORDER — ALBUTEROL SULFATE (2.5 MG/3ML) 0.083% IN NEBU: 2.5000 mg | INHALATION_SOLUTION | RESPIRATORY_TRACT | 3 refills | Status: DC | PRN

## 2023-02-15 MED ORDER — CETIRIZINE HCL 10 MG PO TABS: 10.0000 mg | ORAL_TABLET | Freq: Every day | ORAL | 3 refills | Status: AC | PRN

## 2023-02-15 NOTE — Telephone Encounter (Signed)
Pt called and made him aware of HST results. Appt scheduled for pt with AO. Pt also needed refill of alb sol and zyrtec so meds have been sent for pt. Nothing further needed.

## 2023-03-01 ENCOUNTER — Encounter: Payer: Self-pay | Admitting: Pulmonary Disease

## 2023-03-01 ENCOUNTER — Ambulatory Visit: Payer: Federal, State, Local not specified - PPO | Admitting: Pulmonary Disease

## 2023-03-01 DIAGNOSIS — J4541 Moderate persistent asthma with (acute) exacerbation: Secondary | ICD-10-CM

## 2023-03-01 DIAGNOSIS — J4531 Mild persistent asthma with (acute) exacerbation: Secondary | ICD-10-CM | POA: Diagnosis not present

## 2023-03-01 MED ORDER — ALBUTEROL SULFATE HFA 108 (90 BASE) MCG/ACT IN AERS: INHALATION_SPRAY | RESPIRATORY_TRACT | 3 refills | Status: DC

## 2023-03-01 MED ORDER — ALBUTEROL SULFATE (2.5 MG/3ML) 0.083% IN NEBU: 2.5000 mg | INHALATION_SOLUTION | RESPIRATORY_TRACT | 3 refills | Status: DC | PRN

## 2023-03-01 MED ORDER — BUDESONIDE-FORMOTEROL FUMARATE 80-4.5 MCG/ACT IN AERO: INHALATION_SPRAY | RESPIRATORY_TRACT | 12 refills | Status: DC

## 2023-03-01 NOTE — Progress Notes (Signed)
Austin Curry    [id number]    08/06/1981  Primary Care Physician:Nafziger, Tommi Rumps, NP  Referring Physician: Dorothyann Peng, NP [facility],  [place] [address]  Chief complaint:   In for follow-up following recent sleep study History of asthma  HPI:  Recent sleep study was reviewed with the patient today Negative study for significant sleep disordered breathing  He does have a history of snoring witnessed apneas, daytime sleepiness  Usually goes to bed between 1030 and 12, sometimes takes up to 1 to 2 hours to fall asleep 1-2 awakenings Final wake up time between 520 and 5:30 AM  Asthma symptoms well-controlled on inhalers  Occasional dryness of his mouth in the mornings No morning headaches  No family history of obstructive sleep apnea known to him  Reformed smoker, social smoker in the past    Outpatient Encounter Medications as of 03/01/2023  Medication Sig   acetaminophen (TYLENOL) 500 MG tablet Take 500 mg by mouth every 6 (six) hours as needed.   albuterol (PROVENTIL) (2.5 MG/3ML) 0.083% nebulizer solution Take 3 mLs (2.5 mg total) by nebulization every 4 (four) hours as needed for wheezing or shortness of breath.   albuterol (VENTOLIN HFA) 108 (90 Base) MCG/ACT inhaler TAKE 2 PUFFS BY MOUTH EVERY 6 HOURS AS NEEDED FOR WHEEZE OR SHORTNESS OF BREATH   budesonide-formoterol (SYMBICORT) 80-4.5 MCG/ACT inhaler 2 puffs in the morning AS NEEDED right when you wake up, rinse out your mouth after use, Can repeat in 12 hours later 2 puffs AS NEEDED, rinse after use   cetirizine (ZYRTEC) 10 MG tablet Take 1 tablet (10 mg total) by mouth daily as needed.   fluticasone (FLONASE) 50 MCG/ACT nasal spray SHAKE LIQUID AND USE 1 SPRAY IN EACH NOSTRIL EVERY DAY   meloxicam (MOBIC) 7.5 MG tablet Take 1 tablet (7.5 mg total) by mouth daily.   montelukast (SINGULAIR) 10 MG tablet Take 1 tablet by mouth daily.   Multiple Vitamins-Minerals (CENTRUM MEN PO) Take  by mouth daily. gummy   buPROPion (WELLBUTRIN SR) 150 MG 12 hr tablet Take 1 tablet (150 mg total) by mouth 2 (two) times daily.   No facility-administered encounter medications on file as of 03/01/2023.    Allergies as of 03/01/2023   (No Known Allergies)    Past Medical History:  Diagnosis Date   Environmental allergies    History of hypertension    Injury of ligament of right knee    right medial patellofemoral ligament tear   Intermittent palpitations 01-12-2021  per pt have been taking toprol since 09/ 2021 did not think he needed any more, only feel palpitations when lifting something heavy   previous seen by cardiology,  dr Gwenlyn Found, lov 06-26-2019 pt was released prn basis;  event monitor 06-09-2018 showed ns/ st   Moderate persistent asthma    pulmonology---  b. Warner Mccreedy  NP  (01-12-2021 per pt last exacerbation approxl. 05/ 2021)    Past Surgical History:  Procedure Laterality Date   INSERTION OF MESH N/A 04/18/2016   Procedure: INSERTION OF MESH;  Surgeon: Coralie Keens, MD;  Location: [facility];  Service: General;  Laterality: N/A;   KNEE ARTHROSCOPY WITH MEDIAL PATELLAR FEMORAL LIGAMENT RECONSTRUCTION Right 02/18/2021   Procedure: RIGHT KNEE ARTHROSCOPYLIMITED SYNOVECTOMY WITH MEDIAL PATELLAR FEMORAL LIGAMENT RECONSTRUCTION WITH HAMSTRING ALLOGRAFT;  Surgeon: Nicholes Stairs, MD;  Location: [facility];  Service: Orthopedics;  Laterality: Right;  2 HRS   VENTRAL  HERNIA REPAIR N/A 04/18/2016   Procedure:  VENTRAL HERNIA REPAIR;  Surgeon: Coralie Keens, MD;  Location: [facility];  Service: General;  Laterality: N/A;    Family History  Problem Relation Age of Onset   Diabetes Mother    Hypertension Mother    CVA Mother    Hypercholesterolemia Mother    Hyperlipidemia Father    Asthma Brother    Asthma Brother     Social History   Socioeconomic History   Marital status: Married    Spouse name: Not on file   Number of children: Not on file   Years of  education: Not on file   Highest education level: Not on file  Occupational History   Occupation: Mail Carrier     Employer: [employer]  Tobacco Use   Smoking status: Former    Packs/day: 0.50    Years: 15.00    Total pack years: 7.50    Types: Cigarettes    Quit date: 12/26/2021    Years since quitting: 1.1   Smokeless tobacco: Never  Vaping Use   Vaping Use: Never used  Substance and Sexual Activity   Alcohol use: Not Currently    Alcohol/week: 0.0 standard drinks of alcohol    Comment: occasional   Drug use: Never   Sexual activity: Not on file  Other Topics Concern   Not on file  Social History Narrative   Married    No children       Social Determinants of Health   Financial Resource Strain: Not on file  Food Insecurity: Not on file  Transportation Needs: Not on file  Physical Activity: Not on file  Stress: Not on file  Social Connections: Not on file  Intimate Partner Violence: Not on file    Review of Systems  Constitutional:  Positive for fatigue.  Psychiatric/Behavioral:  Positive for sleep disturbance.     Vitals:   03/01/23 1409  BP: 104/68  Pulse: 73  SpO2: 96%     Physical Exam Constitutional:      Appearance: Normal appearance.  HENT:     Head: Normocephalic.     Mouth/Throat:     Comments: Mallampati 3, relative macroglossia Eyes:     General: No scleral icterus. Cardiovascular:     Rate and Rhythm: Normal rate and regular rhythm.     Heart sounds: No murmur heard.    No friction rub.  Pulmonary:     Effort: No respiratory distress.     Breath sounds: No stridor. No wheezing.  Musculoskeletal:     Cervical back: No rigidity or tenderness.  Neurological:     Mental Status: He is alert.     Cranial Nerves: No cranial nerve deficit.       08/17/2022    3:00 PM  Results of the Epworth flowsheet  Sitting and reading 3  Watching TV 3  Sitting, inactive in a public place (e.g. a theatre or a meeting) 0  As a passenger in a car for an  hour without a break 0  Lying down to rest in the afternoon when circumstances permit 2  Sitting and talking to someone 2  Sitting quietly after a lunch without alcohol 2  In a car, while stopped for a few minutes in traffic 2  Total score 14    Data Reviewed: In lab sleep study was reviewed with the patient showing negative study for significant sleep disordered breathing  Assessment:  Patient with daytime sleepiness, witnessed apneas -Had a  recent in lab study that was negative for significant sleep disordered breathing -This was discussed with the patient today -This rules out the possibility of significant sleep disordered breathing  Encouraged to make sure he gets enough hours of sleep at night  Repeat study may be considered if he continues to have significant daytime symptoms despite adequate number of hours of sleep  Plan/Recommendations: Inhalers refilled today Nebulization medications refilled today  Follow-up in 6 months  Encouraged to call with significant concerns   Sherrilyn Rist MD [facility] 03/01/2023, 2:17 PM  CC: Dorothyann Peng, NP

## 2023-03-01 NOTE — Patient Instructions (Signed)
I will see you back in about 6 months  Continue with the albuterol and Symbicort Symbicort should be twice a day  Call us with significant concerns  Make sure you get about 6 to 8 hours of sleep every night

## 2023-03-22 ENCOUNTER — Other Ambulatory Visit: Payer: Self-pay

## 2023-03-22 ENCOUNTER — Encounter (HOSPITAL_COMMUNITY): Payer: Self-pay | Admitting: Emergency Medicine

## 2023-03-22 ENCOUNTER — Ambulatory Visit (HOSPITAL_COMMUNITY)
Admission: EM | Admit: 2023-03-22 | Discharge: 2023-03-22 | Disposition: A | Discharge status: Home / Self Care | Payer: Federal, State, Local not specified - PPO | Attending: Family Medicine | Admitting: Family Medicine

## 2023-03-22 DIAGNOSIS — J029 Acute pharyngitis, unspecified: Secondary | ICD-10-CM

## 2023-03-22 DIAGNOSIS — J4521 Mild intermittent asthma with (acute) exacerbation: Secondary | ICD-10-CM | POA: Diagnosis not present

## 2023-03-22 MED ORDER — PREDNISONE 20 MG PO TABS: 40.0000 mg | ORAL_TABLET | Freq: Every day | ORAL | 0 refills | Status: DC

## 2023-03-22 MED ORDER — LIDOCAINE VISCOUS HCL 2 % MT SOLN: 5.0000 mL | Freq: Four times a day (QID) | OROMUCOSAL | 0 refills | Status: DC | PRN

## 2023-03-22 NOTE — ED Triage Notes (Signed)
Sore throat yesterday.  This morning, throat still sore and hoarseness.  Denies fever.  Patient reports runny nose and a history of allergies.  Patient has a history of asthma and has been hospitalized for this before.  Reports yellowish phlegm.    Patient has been drinking tea and vapocool cough drops.

## 2023-03-22 NOTE — ED Provider Notes (Signed)
[facility]   [id number] 03/22/23 Arrival Time: [id number]  ASSESSMENT & PLAN:  1. Mild intermittent asthma with acute exacerbation   2. Sore throat    Suspect allergy trigger; seasonal. Continue home medications.  Meds ordered this encounter  Medications   predniSONE (DELTASONE) 20 MG tablet    Sig: Take 2 tablets (40 mg total) by mouth daily.    Dispense:  10 tablet    Refill:  0   magic mouthwash (lidocaine, diphenhydrAMINE, alum & mag hydroxide) suspension    Sig: Swish and spit 5 mLs 4 (four) times daily as needed for mouth pain.    Dispense:  360 mL    Refill:  0   Activities as tolerated.    Discharge Instructions      You may use over the counter ibuprofen or acetaminophen as needed.  For a sore throat, over the counter products such as Colgate Peroxyl Mouth Sore Rinse or Chloraseptic Sore Throat Spray may provide some temporary relief.       Reviewed expectations re: course of current medical issues. Questions answered. Outlined signs and symptoms indicating need for more acute intervention. Patient verbalized understanding. After Visit Summary given.   SUBJECTIVE:  Austin Curry is a 42 y.o. male who reports a sore throat and wheezing; first noted yesterday; same today. Denies SOB/CP. Denies fever. Albuterol inhaler with some relief.  OBJECTIVE:  Vitals:   03/22/23 1935  BP: 136/81  Pulse: 81  Resp: 20  Temp: 98.6 F (37 C)  TempSrc: Oral  SpO2: 95%     General appearance: alert; no distress HEENT: throat with mild cobblestoning; uvula is midline Neck: supple with FROM; no lymphadenopathy Lungs: speaks full sentences without difficulty; unlabored; bilateral exp wheezing; occas dry cough Abd: soft; non-tender Skin: reveals no rash; warm and dry Psychological: alert and cooperative; normal mood and affect  No Known Allergies  Past Medical History:  Diagnosis Date   Environmental allergies    History of hypertension    Injury of  ligament of right knee    right medial patellofemoral ligament tear   Intermittent palpitations 01-12-2021  per pt have been taking toprol since 09/ 2021 did not think he needed any more, only feel palpitations when lifting something heavy   previous seen by cardiology,  dr Gwenlyn Found, lov 06-26-2019 pt was released prn basis;  event monitor 06-09-2018 showed ns/ st   Moderate persistent asthma    pulmonology---  b. Warner Mccreedy  NP  (01-12-2021 per pt last exacerbation approxl. 05/ 2021)   Social History   Socioeconomic History   Marital status: Married    Spouse name: Not on file   Number of children: Not on file   Years of education: Not on file   Highest education level: Not on file  Occupational History   Occupation: Mail Carrier     Employer: [employer]  Tobacco Use   Smoking status: Former    Packs/day: 0.50    Years: 15.00    Additional pack years: 0.00    Total pack years: 7.50    Types: Cigarettes    Quit date: 12/26/2021    Years since quitting: 1.2   Smokeless tobacco: Never  Vaping Use   Vaping Use: Never used  Substance and Sexual Activity   Alcohol use: Not Currently    Alcohol/week: 0.0 standard drinks of alcohol    Comment: occasional   Drug use: Never   Sexual activity: Not on file  Other Topics Concern  Not on file  Social History Narrative   Married    No children       Social Determinants of Health   Financial Resource Strain: Not on file  Food Insecurity: Not on file  Transportation Needs: Not on file  Physical Activity: Not on file  Stress: Not on file  Social Connections: Not on file  Intimate Partner Violence: Not on file   Family History  Problem Relation Age of Onset   Diabetes Mother    Hypertension Mother    CVA Mother    Hypercholesterolemia Mother    Hyperlipidemia Father    Asthma Brother    Asthma Brother            Vanessa Kick, MD 03/22/23 1958

## 2023-03-22 NOTE — Discharge Instructions (Signed)
You may use over the counter ibuprofen or acetaminophen as needed.  °For a sore throat, over the counter products such as Colgate Peroxyl Mouth Sore Rinse or Chloraseptic Sore Throat Spray may provide some temporary relief. ° ° ° ° °

## 2023-03-27 DIAGNOSIS — J3081 Allergic rhinitis due to animal (cat) (dog) hair and dander: Secondary | ICD-10-CM | POA: Diagnosis not present

## 2023-03-27 DIAGNOSIS — J301 Allergic rhinitis due to pollen: Secondary | ICD-10-CM | POA: Diagnosis not present

## 2023-03-27 DIAGNOSIS — J3089 Other allergic rhinitis: Secondary | ICD-10-CM | POA: Diagnosis not present

## 2023-03-27 DIAGNOSIS — J4540 Moderate persistent asthma, uncomplicated: Secondary | ICD-10-CM | POA: Diagnosis not present

## 2023-07-05 DIAGNOSIS — Z3009 Encounter for other general counseling and advice on contraception: Secondary | ICD-10-CM | POA: Diagnosis not present

## 2023-09-13 ENCOUNTER — Ambulatory Visit (INDEPENDENT_AMBULATORY_CARE_PROVIDER_SITE_OTHER): Payer: Federal, State, Local not specified - PPO | Admitting: Adult Health

## 2023-09-13 ENCOUNTER — Encounter: Payer: Self-pay | Admitting: Adult Health

## 2023-09-13 VITALS — BP 110/60 | HR 73 | Temp 98.1°F | Ht 74.0 in | Wt 169.0 lb

## 2023-09-13 DIAGNOSIS — Z23 Encounter for immunization: Secondary | ICD-10-CM

## 2023-09-13 DIAGNOSIS — R634 Abnormal weight loss: Secondary | ICD-10-CM

## 2023-09-13 DIAGNOSIS — F32A Depression, unspecified: Secondary | ICD-10-CM

## 2023-09-13 DIAGNOSIS — J4541 Moderate persistent asthma with (acute) exacerbation: Secondary | ICD-10-CM

## 2023-09-13 DIAGNOSIS — E782 Mixed hyperlipidemia: Secondary | ICD-10-CM | POA: Diagnosis not present

## 2023-09-13 DIAGNOSIS — F419 Anxiety disorder, unspecified: Secondary | ICD-10-CM

## 2023-09-13 DIAGNOSIS — Z0000 Encounter for general adult medical examination without abnormal findings: Secondary | ICD-10-CM | POA: Diagnosis not present

## 2023-09-13 LAB — CBC
HCT: 45.6 % (ref 39.0–52.0)
Hemoglobin: 14.9 g/dL (ref 13.0–17.0)
MCHC: 32.6 g/dL (ref 30.0–36.0)
MCV: 87.9 fl (ref 78.0–100.0)
Platelets: 191 10*3/uL (ref 150.0–400.0)
RBC: 5.19 Mil/uL (ref 4.22–5.81)
RDW: 13.7 % (ref 11.5–15.5)
WBC: 2.9 10*3/uL — ABNORMAL LOW (ref 4.0–10.5)

## 2023-09-13 LAB — COMPREHENSIVE METABOLIC PANEL WITH GFR
ALT: 7 U/L (ref 0–53)
AST: 13 U/L (ref 0–37)
Albumin: 4.1 g/dL (ref 3.5–5.2)
Alkaline Phosphatase: 38 U/L — ABNORMAL LOW (ref 39–117)
BUN: 10 mg/dL (ref 6–23)
CO2: 29 meq/L (ref 19–32)
Calcium: 9.7 mg/dL (ref 8.4–10.5)
Chloride: 104 meq/L (ref 96–112)
Creatinine, Ser: 1.27 mg/dL (ref 0.40–1.50)
GFR: 70.09 mL/min (ref >60.00)
Glucose, Bld: 99 mg/dL (ref 70–99)
Potassium: 4.7 meq/L (ref 3.5–5.1)
Sodium: 139 meq/L (ref 135–145)
Total Bilirubin: 0.7 mg/dL (ref 0.2–1.2)
Total Protein: 6.3 g/dL (ref 6.0–8.3)

## 2023-09-13 LAB — LIPID PANEL
Cholesterol: 156 mg/dL (ref 0–200)
HDL: 44.8 mg/dL (ref >39.00)
LDL Cholesterol: 98 mg/dL (ref 0–99)
NonHDL: 110.85
Total CHOL/HDL Ratio: 3
Triglycerides: 64 mg/dL (ref 0.0–149.0)
VLDL: 12.8 mg/dL (ref 0.0–40.0)

## 2023-09-13 LAB — TSH: TSH: 0.32 u[IU]/mL — ABNORMAL LOW (ref 0.35–5.50)

## 2023-09-13 MED ORDER — BUPROPION HCL ER (XL) 150 MG PO TB24: 150.0000 mg | ORAL_TABLET | Freq: Every day | ORAL | 1 refills | Status: DC

## 2023-09-13 NOTE — Patient Instructions (Signed)
It was great seeing you today   We will follow up with you regarding your lab work   Please let me know if you need anything   I have resent in the Wellbutrin. Follow up in 1-2 months to see how you are doing
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
## 2023-09-14 ENCOUNTER — Other Ambulatory Visit: Payer: Self-pay | Admitting: Adult Health

## 2023-09-14 DIAGNOSIS — R7989 Other specified abnormal findings of blood chemistry: Secondary | ICD-10-CM

## 2023-09-14 NOTE — Addendum Note (Signed)
Addended by: Waymon Amato R on: 09/14/2023 08:44 AM   Modules accepted: Orders

## 2023-09-27 ENCOUNTER — Other Ambulatory Visit (INDEPENDENT_AMBULATORY_CARE_PROVIDER_SITE_OTHER): Payer: Federal, State, Local not specified - PPO

## 2023-09-27 DIAGNOSIS — R7989 Other specified abnormal findings of blood chemistry: Secondary | ICD-10-CM | POA: Diagnosis not present

## 2023-09-27 LAB — T4, FREE: Free T4: 0.95 ng/dL (ref 0.60–1.60)

## 2023-09-27 LAB — TSH: TSH: 0.36 u[IU]/mL (ref 0.35–5.50)

## 2023-09-27 LAB — T3, FREE: T3, Free: 3 pg/mL (ref 2.3–4.2)

## 2023-10-31 ENCOUNTER — Other Ambulatory Visit: Payer: Self-pay

## 2023-10-31 DIAGNOSIS — J3081 Allergic rhinitis due to animal (cat) (dog) hair and dander: Secondary | ICD-10-CM | POA: Insufficient documentation

## 2023-10-31 DIAGNOSIS — Z8679 Personal history of other diseases of the circulatory system: Secondary | ICD-10-CM | POA: Insufficient documentation

## 2023-10-31 DIAGNOSIS — J4540 Moderate persistent asthma, uncomplicated: Secondary | ICD-10-CM | POA: Insufficient documentation

## 2023-10-31 DIAGNOSIS — J309 Allergic rhinitis, unspecified: Secondary | ICD-10-CM | POA: Insufficient documentation

## 2023-10-31 DIAGNOSIS — R002 Palpitations: Secondary | ICD-10-CM | POA: Insufficient documentation

## 2023-10-31 DIAGNOSIS — S8991XA Unspecified injury of right lower leg, initial encounter: Secondary | ICD-10-CM | POA: Insufficient documentation

## 2023-10-31 DIAGNOSIS — Z9109 Other allergy status, other than to drugs and biological substances: Secondary | ICD-10-CM | POA: Insufficient documentation

## 2023-10-31 HISTORY — DX: Allergic rhinitis, unspecified: J30.9

## 2023-10-31 HISTORY — DX: Allergic rhinitis due to animal (cat) (dog) hair and dander: J30.81

## 2023-11-01 ENCOUNTER — Encounter: Payer: Self-pay | Admitting: Cardiology

## 2023-11-01 ENCOUNTER — Telehealth: Payer: Self-pay | Admitting: Cardiology

## 2023-11-01 ENCOUNTER — Telehealth: Payer: Self-pay | Admitting: Adult Health

## 2023-11-01 ENCOUNTER — Encounter: Payer: Self-pay | Admitting: Adult Health

## 2023-11-01 ENCOUNTER — Ambulatory Visit: Payer: Federal, State, Local not specified - PPO | Attending: Cardiology | Admitting: Cardiology

## 2023-11-01 VITALS — BP 120/70 | HR 70 | Ht 74.0 in | Wt 177.0 lb

## 2023-11-01 DIAGNOSIS — R002 Palpitations: Secondary | ICD-10-CM

## 2023-11-01 DIAGNOSIS — F17210 Nicotine dependence, cigarettes, uncomplicated: Secondary | ICD-10-CM

## 2023-11-01 DIAGNOSIS — R0609 Other forms of dyspnea: Secondary | ICD-10-CM

## 2023-11-01 HISTORY — DX: Other forms of dyspnea: R06.09

## 2023-11-01 HISTORY — DX: Nicotine dependence, cigarettes, uncomplicated: F17.210

## 2023-11-01 NOTE — Telephone Encounter (Signed)
Call went straight to VM. Left message to call back

## 2023-11-01 NOTE — Telephone Encounter (Addendum)
Patient is requesting a note for work stating he cannot lift anything heavier than 25 lbs, as it may cause fast heart rate and palpitations.  Please advise.

## 2023-11-01 NOTE — Telephone Encounter (Signed)
Spoke to pt and he stated Cardio could not write the letter until they do a stress test However, pt stated he is not set for the test until next month. Pt stated that his heart cannot handle the lifting and he is worried and need the letter to be temp until his stress test.

## 2023-11-01 NOTE — Telephone Encounter (Signed)
Call went straight to VM

## 2023-11-01 NOTE — Telephone Encounter (Signed)
Patient notified of update  and verbalized understanding. 

## 2023-11-01 NOTE — Patient Instructions (Signed)
Medication Instructions:  Your physician recommends that you continue on your current medications as directed. Please refer to the Current Medication list given to you today.  *If you need a refill on your cardiac medications before your next appointment, please call your pharmacy*   Lab Work: None ordered If you have labs (blood work) drawn today and your tests are completely normal, you will receive your results only by: MyChart Message (if you have MyChart) OR A paper copy in the mail If you have any lab test that is abnormal or we need to change your treatment, we will call you to review the results.   Testing/Procedures:  Stress Echocardiogram Instructions:    1. You may take all of your medications.  2. No food, drink or tobacco products four hours prior to your test.  3. Dress prepared to exercise. Best to wear 2 piece outfit and tennis shoes. Shoes must be closed toe.  4. Please bring all current prescription medications.  *Use your INHALER the morning of your stress test and bring it with you to the test*  Follow-Up: At [facility], you and your health needs are our priority.  As part of our continuing mission to provide you with exceptional heart care, we have created designated Provider Care Teams.  These Care Teams include your primary Cardiologist (physician) and Advanced Practice Providers (APPs -  Physician Assistants and Nurse Practitioners) who all work together to provide you with the care you need, when you need it.  We recommend signing up for the patient portal called "MyChart".  Sign up information is provided on this After Visit Summary.  MyChart is used to connect with patients for Virtual Visits (Telemedicine).  Patients are able to view lab/test results, encounter notes, upcoming appointments, etc.  Non-urgent messages can be sent to your provider as well.   To learn more about what you can do with MyChart, go to [email or web address].    Your next  appointment:   9 month(s)  The format for your next appointment:   In Person  Provider:   Belva Crome, MD   Other Instructions Exercise Stress Echocardiogram An exercise stress echocardiogram is a test to check how well your heart is working. This test uses sound waves and a computer to make pictures of your heart. These pictures will be taken before and after you exercise. For this test, you will walk on a treadmill or ride a bicycle to make your heart beat faster. While you exercise, your heart will be checked with an electrocardiogram (ECG). Your blood pressure will also be checked. You may have this test if: You have chest pain or a heart problem. You had a heart attack or heart surgery not long ago. You have heart valve problems. You have a condition that causes narrowing of the blood vessels that supply your heart. You have a high risk of heart disease and: You are starting a new exercise program. You need to have a big surgery. Tell a doctor about: Any allergies you have. All medicines you are taking. This includes vitamins, herbs, eye drops, creams, and over-the-counter medicines. Any problems you or family members have had with medicines that make you fall asleep (anesthetic medicines). Any surgeries you have had. Any blood disorders you have. Any medical conditions you have. Whether you are pregnant or may be pregnant. What are the risks? Generally, this is a safe test. However, problems may occur, including: Chest pain. Feeling dizzy or light-headed. Shortness of breath. Increased or  irregular heartbeat. Feeling like you may vomit (nausea) or vomiting. Heart attack. This is very rare. What happens before the test? Medicines Ask your doctor about changing or stopping your normal medicines. This is important if you take diabetes medicines or blood thinners. If you use an inhaler, bring it to the test. General instructions Wear comfortable clothes and walking  shoes. Follow instructions from your doctor about what you cannot eat or drink before the test. Do not drink or eat anything that has caffeine in it. Stop having caffeine 24 hours before the test. Do not smoke or use products that contain nicotine or tobacco for 4 hours before the test. If you need help quitting, ask your doctor. What happens during the test?  You will take off your clothes from the waist up and put on a hospital gown. Electrodes or patches will be put on your chest. A blood pressure cuff will be put on your arm. Before you exercise, a computer will make a picture of your heart. To do this: You will lie down and a gel will be put on your chest. A wand will be moved over the gel. Sound waves from the wand will go to the computer to make the picture. Then, you will start to exercise. You may walk on a treadmill or pedal a bicycle. Your blood pressure and heart rhythm will be checked while you exercise. The exercise will get harder or faster. You will exercise until: Your heart reaches a certain level. You are too tired to go on. You cannot go on because of chest pain, weakness, or dizziness. You will lie down right away so another picture of your heart can be taken. The procedure may vary among doctors and hospitals. What can I expect after the test? After your test, it is common to have: Mild soreness. Mild tiredness. Your heart rate and blood pressure will be checked until they return to your normal levels. You should not have any new symptoms after this test. Follow these instructions at home: If your doctor says that you can, you may: Eat what you normally eat. Do your normal activities. Take over-the-counter and prescription medicines only as told by your doctor. Keep all follow-up visits. It is up to you to get the results of your test. Ask how to get your results when they are ready. Contact a doctor if: You feel dizzy or light-headed. You have a fast or  irregular heartbeat. You feel like you may vomit or you vomit. You have a headache. You feel short of breath. Get help right away if: You develop pain or pressure: In your chest. In your jaw or neck. Between your shoulders. That goes down your left arm. You faint. You have trouble breathing. These symptoms may be an emergency. Get medical help right away. Call your local emergency services (911 in the [place] Do not wait to see if the symptoms will go away. Do not drive yourself to the hospital. Summary This is a test that checks how well your heart is working. Follow instructions about what you cannot eat or drink before the test. Ask your doctor if you should take your normal medicines before the test. Stop having caffeine 24 hours before the test. Do not smoke or use products with nicotine or tobacco in them for 4 hours before the test. During the test, your blood pressure and heart rhythm will be checked while you exercise. This information is not intended to replace advice given to you by  your health care provider. Make sure you discuss any questions you have with your health care provider. Document Revised: 08/25/2021 Document Reviewed: 08/04/2020 Elsevier Patient Education  2022 [employer].

## 2023-11-01 NOTE — Telephone Encounter (Signed)
Ok for note 

## 2023-11-01 NOTE — Telephone Encounter (Signed)
Patient is requesting call back to discuss note needed for work. He has questions about the test needed for this note. Would like to discuss further.

## 2023-11-01 NOTE — Progress Notes (Signed)
Cardiology Office Note:    Date:  11/01/2023   ID:  Austin Curry, DOB 1981/08/26, MRN [id number]  PCP:  Shirline Frees, NP  Cardiologist:  Garwin Brothers, MD   Referring MD: Shirline Frees, NP    ASSESSMENT:    1. Palpitations   2. Dyspnea on exertion   3. Cigarette smoker    PLAN:    In order of problems listed above:  Primary prevention stressed with the patient.  Importance of compliance with diet medication stressed and patient verbalized standing. Palpitations and dyspnea happening on exertion: I reassured the patient about my findings.  My evaluation today is just fine.  To reassure him and evaluate him for getting back to work will do exercise stress echo.  He is agreeable.  I told him to take his inhaler on the morning of the test.  If the test is negative then he should be fine to get back to work from a cardiovascular standpoint.  Of course he will need the evaluation of his primary care provider for other issues which are noncardiac. Cigarette smoker: I spent 5 minutes with the patient discussing solely about smoking. Smoking cessation was counseled. I suggested to the patient also different medications and pharmacological interventions. Patient is keen to try stopping on its own at this time. He will get back to me if he needs any further assistance in this matter. Patient will be seen in follow-up appointment in 6 months or earlier if the patient has any concerns.    Medication Adjustments/Labs and Tests Ordered: Current medicines are reviewed at length with the patient today.  Concerns regarding medicines are outlined above.  Orders Placed This Encounter  Procedures   Cardiac Stress Test: Informed Consent Details: Physician/Practitioner Attestation; Transcribe to consent form and obtain patient signature   EKG 12-Lead   ECHOCARDIOGRAM STRESS TEST   No orders of the defined types were placed in this encounter.    History of Present Illness:    Austin Curry is a 42 y.o. male who is being seen today for the evaluation of palpitations at the request of Shirline Frees, NP.  Patient is a pleasant 42 year old male.  He is accompanied by a friend.  He denies any history of hypertension dyslipidemia or diabetes mellitus.  He has history of allergies and asthma.  He mentions to me that he works at the [employer].  When he lifts weight he has some palpitations.  He has had some shortness of breath on exertion though he says is nothing much remarkable.  It is steady and has not gotten worse.  This happens when he works at a very fast pace.  No dizziness syncope.  At the time of my evaluation, the patient is alert awake oriented and in no distress.  He mentions to me that his work needs a note for evaluation from a cardiovascular standpoint and therefore he is here.  Past Medical History:  Diagnosis Date   Allergic rhinitis 10/31/2023   Allergic rhinitis due to animal (cat) (dog) hair and dander 10/31/2023   Asthma exacerbation 04/08/2016   PFT's  08/23/16   FEV1 3.97 (94 % ) ratio 73  p 17 % improvement from saba p nothing prior to study  - 02/27/2017  After extensive coaching HFA effectiveness =    90% > change to symb 160 2bid   - Allergy profile 02/27/2017 >  Eos 0.3 /  IgE  595 multiple Pos RAST  Asthma, chronic 06/25/2013   02/27/2017-respiratory allergy panel-multiple elevations, highest with [place] grass, Box Elder IgE, common silver birch, oak, pecan hickory tree, ragweed, sheep Sorrell, rough pigweed, IgE-595     02/27/2017-CBC with differential-eosinophils relative 4.2, eosinophils absolute 0.3     08/23/2016-pulmonary function test-FVC FVC 5.0 (98% predicted), postbronchodilator ratio 73, postbronchodilator FEV1 3.   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 Cherre Huger  NP  (01-12-2021 per pt last exacerbation approxl. 05/ 2021)   Pain in right knee 06/17/2019   Palpitations 05/30/2018   Palpitations     Right shoulder pain 11/23/2017   Snoring 01/27/2023   Ventral hernia 01/12/2016    Past Surgical History:  Procedure Laterality Date   INSERTION OF MESH N/A 04/18/2016   Procedure: INSERTION OF MESH;  Surgeon: Abigail Miyamoto, MD;  Location: [facility];  Service: General;  Laterality: N/A;   KNEE ARTHROSCOPY WITH MEDIAL PATELLAR FEMORAL LIGAMENT RECONSTRUCTION Right 02/18/2021   Procedure: RIGHT KNEE ARTHROSCOPYLIMITED SYNOVECTOMY WITH MEDIAL PATELLAR FEMORAL LIGAMENT RECONSTRUCTION WITH HAMSTRING ALLOGRAFT;  Surgeon: Yolonda Kida, MD;  Location: [facility];  Service: Orthopedics;  Laterality: Right;  2 HRS   VENTRAL HERNIA REPAIR N/A 04/18/2016   Procedure:  VENTRAL HERNIA REPAIR;  Surgeon: Abigail Miyamoto, MD;  Location: [facility];  Service: General;  Laterality: N/A;    Current Medications: Current Meds  Medication Sig   acetaminophen (TYLENOL) 500 MG tablet Take 500 mg by mouth every 6 (six) hours as needed for mild pain (pain score 1-3) or moderate pain (pain score 4-6).   albuterol (PROVENTIL) (2.5 MG/3ML) 0.083% nebulizer solution Take 3 mLs  (2.5 mg total) by nebulization every 4 (four) hours as needed for wheezing or shortness of breath.   albuterol (VENTOLIN HFA) 108 (90 Base) MCG/ACT inhaler 2 puffs 4 times daily as needed   budesonide-formoterol (SYMBICORT) 80-4.5 MCG/ACT inhaler 2 puffs in the morning AS NEEDED right when you wake up, rinse out your mouth after use, Can repeat in 12 hours later 2 puffs AS NEEDED, rinse after use   buPROPion (WELLBUTRIN XL) 150 MG 24 hr tablet Take 1 tablet (150 mg total) by mouth daily.   cetirizine (ZYRTEC) 10 MG tablet Take 1 tablet (10 mg total) by mouth daily as needed.   fluticasone (FLONASE) 50 MCG/ACT nasal spray Place 1 spray into both nostrils daily.   Multiple Vitamins-Minerals (CENTRUM MEN PO) Take 1 each by mouth daily.     Allergies:   Patient has no known allergies.   Social History   Socioeconomic History   Marital status: Married    Spouse name: Not on file  Number of children: Not on file   Years of education: Not on file   Highest education level: Not on file  Occupational History   Occupation: Mail Carrier     Employer: [employer]  Tobacco Use   Smoking status: Former    Current packs/day: 0.00    Average packs/day: 0.5 packs/day for 15.0 years (7.5 ttl pk-yrs)    Types: Cigarettes    Start date: 12/26/2006    Quit date: 12/26/2021    Years since quitting: 1.8   Smokeless tobacco: Never  Vaping Use   Vaping status: Never Used  Substance and Sexual Activity   Alcohol use: Not Currently    Alcohol/week: 0.0 standard drinks of alcohol    Comment: occasional   Drug use: Never   Sexual activity: Not on file  Other Topics Concern   Not on file  Social History Narrative   Married    No children       Social Determinants of Health   Financial Resource Strain: Not on file  Food Insecurity: Not on file  Transportation Needs: Not on file  Physical Activity: Not on file  Stress: Not on file  Social Connections: Unknown (09/13/2022)   Received from [facility],  [facility]   Social Network    Social Network: Not on file     Family History: The patient's family history includes Asthma in his brother and brother; CVA in his mother; Diabetes in his mother; Hypercholesterolemia in his mother; Hyperlipidemia in his father; Hypertension in his mother.  ROS:   Please see the history of present illness.    All other systems reviewed and are negative.  EKGs/Labs/Other Studies Reviewed:    The following studies were reviewed today: .Marland KitchenEKG Interpretation Date/Time:  Wednesday November 01 2023 11:48:45 EST Ventricular Rate:  70 PR Interval:  146 QRS Duration:  108 QT Interval:  368 QTC Calculation: 397 R Axis:   73  Text Interpretation: Normal sinus rhythm with sinus arrhythmia Normal ECG When compared with ECG of 18-Feb-2021 11:05, QRS axis Shifted right Confirmed by Belva Crome [phone]) on 11/01/2023 10:57:44 AM      Recent Labs: 09/13/2023: ALT 7; BUN 10; Creatinine, Ser 1.27; Hemoglobin 14.9; Platelets 191.0; Potassium 4.7; Sodium 139 09/27/2023: TSH 0.36  Recent Lipid Panel    Component Value Date/Time   CHOL 156 09/13/2023 0840   TRIG 64.0 09/13/2023 0840   HDL 44.80 09/13/2023 0840   CHOLHDL 3 09/13/2023 0840   VLDL 12.8 09/13/2023 0840   LDLCALC 98 09/13/2023 0840    Physical Exam:    VS:  BP 120/70   Pulse 70   Ht 6\' 2"  (1.88 m)   Wt 177 lb (80.3 kg)   SpO2 96%   BMI 22.73 kg/m     Wt Readings from Last 3 Encounters:  11/01/23 177 lb (80.3 kg)  09/13/23 169 lb (76.7 kg)  03/01/23 194 lb (88 kg)     GEN: Patient is in no acute distress HEENT: Normal NECK: No JVD; No carotid bruits LYMPHATICS: No lymphadenopathy CARDIAC: S1 S2 regular, 2/6 systolic murmur at the apex. RESPIRATORY:  Clear to auscultation without rales, wheezing or rhonchi  ABDOMEN: Soft, non-tender, non-distended MUSCULOSKELETAL:  No edema; No deformity  SKIN: Warm and dry NEUROLOGIC:  Alert and oriented x 3 PSYCHIATRIC:  Normal affect     Signed, Garwin Brothers, MD  11/01/2023 11:31 AM     [facility]

## 2023-11-15 ENCOUNTER — Telehealth (HOSPITAL_COMMUNITY): Payer: Self-pay | Admitting: *Deleted

## 2023-11-15 NOTE — Telephone Encounter (Signed)
 Gave pt instructions for stress echo.

## 2023-11-16 ENCOUNTER — Ambulatory Visit (INDEPENDENT_AMBULATORY_CARE_PROVIDER_SITE_OTHER): Payer: Federal, State, Local not specified - PPO | Admitting: Adult Health

## 2023-11-16 ENCOUNTER — Encounter: Payer: Self-pay | Admitting: Adult Health

## 2023-11-16 VITALS — BP 138/70 | HR 78 | Temp 98.5°F | Ht 74.0 in | Wt 167.0 lb

## 2023-11-16 DIAGNOSIS — F32A Depression, unspecified: Secondary | ICD-10-CM | POA: Diagnosis not present

## 2023-11-16 DIAGNOSIS — F419 Anxiety disorder, unspecified: Secondary | ICD-10-CM

## 2023-11-16 MED ORDER — BUPROPION HCL ER (XL) 300 MG PO TB24: 300.0000 mg | ORAL_TABLET | Freq: Every day | ORAL | 1 refills | Status: DC
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
## 2023-11-22 ENCOUNTER — Ambulatory Visit: Payer: Federal, State, Local not specified - PPO

## 2023-11-22 ENCOUNTER — Ambulatory Visit (INDEPENDENT_AMBULATORY_CARE_PROVIDER_SITE_OTHER): Payer: Federal, State, Local not specified - PPO

## 2023-11-22 DIAGNOSIS — R002 Palpitations: Secondary | ICD-10-CM | POA: Diagnosis not present

## 2023-11-22 DIAGNOSIS — R0609 Other forms of dyspnea: Secondary | ICD-10-CM

## 2023-11-22 LAB — ECHOCARDIOGRAM STRESS TEST: S' Lateral: 2.8 cm

## 2023-11-27 ENCOUNTER — Telehealth: Payer: Self-pay | Admitting: Cardiology

## 2023-11-27 ENCOUNTER — Telehealth: Payer: Self-pay

## 2023-11-27 DIAGNOSIS — I517 Cardiomegaly: Secondary | ICD-10-CM

## 2023-11-27 DIAGNOSIS — R002 Palpitations: Secondary | ICD-10-CM

## 2023-11-27 NOTE — Telephone Encounter (Signed)
-----   Message from Garwin Brothers sent at 11/22/2023  5:24 PM EST ----- Stress echo fine. LVH needs cardiac MR to clarify. Cc pcp Garwin Brothers, MD 11/22/2023 5:23 PM

## 2023-11-27 NOTE — Telephone Encounter (Signed)
MyChart message

## 2023-11-27 NOTE — Telephone Encounter (Signed)
Pt states that he has sharp chest pain at times and he has palpations when he picks up 25 pounds or more. Pt had several questions related to this and if this was related to the severe LVH noted on the stress echo. Pt had a note in the past that he was not to lift greater than 25 pounds from Dr. Allyson Sabal. Please advise.  Results reviewed with pt as per Dr. Kem Parkinson note.  Pt verbalized understanding and had no additional questions. Routed to PCP.

## 2023-11-27 NOTE — Telephone Encounter (Signed)
Patient is calling requesting a callback to discuss his stress echo results. Please advise.

## 2023-11-29 ENCOUNTER — Ambulatory Visit: Payer: Federal, State, Local not specified - PPO | Attending: Cardiology

## 2023-11-29 DIAGNOSIS — R002 Palpitations: Secondary | ICD-10-CM

## 2023-11-29 NOTE — Telephone Encounter (Signed)
Lvm to return call.  At this time no indication to not lift greater than 25 pounds per Dr. Tomie China. Pt needs to wear a 14 day zio for palpations.

## 2023-11-29 NOTE — Telephone Encounter (Signed)
Recommendations reviewed with pt as per 12/4 note.  Pt verbalized understanding and had no additional questions.

## 2023-11-29 NOTE — Addendum Note (Signed)
Addended by: Eleonore Chiquito on: 11/29/2023 05:07 PM   Modules accepted: Orders

## 2023-12-02 DIAGNOSIS — R002 Palpitations: Secondary | ICD-10-CM

## 2023-12-12 ENCOUNTER — Ambulatory Visit (INDEPENDENT_AMBULATORY_CARE_PROVIDER_SITE_OTHER): Payer: Federal, State, Local not specified - PPO | Admitting: Adult Health

## 2023-12-12 DIAGNOSIS — F419 Anxiety disorder, unspecified: Secondary | ICD-10-CM

## 2023-12-12 DIAGNOSIS — F32A Depression, unspecified: Secondary | ICD-10-CM

## 2023-12-12 MED ORDER — BUPROPION HCL ER (XL) 300 MG PO TB24: 300.0000 mg | ORAL_TABLET | Freq: Every day | ORAL | 1 refills | Status: DC
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
## 2024-01-25 ENCOUNTER — Other Ambulatory Visit: Payer: Federal, State, Local not specified - PPO

## 2024-01-25 ENCOUNTER — Encounter (HOSPITAL_COMMUNITY): Payer: Self-pay

## 2024-01-25 DIAGNOSIS — I517 Cardiomegaly: Secondary | ICD-10-CM | POA: Diagnosis not present

## 2024-01-26 LAB — HEMOGLOBIN AND HEMATOCRIT, BLOOD
Hematocrit: 45.3 % (ref 37.5–51.0)
Hemoglobin: 15.3 g/dL (ref 13.0–17.7)

## 2024-01-30 ENCOUNTER — Other Ambulatory Visit: Payer: Self-pay | Admitting: Cardiology

## 2024-01-30 ENCOUNTER — Ambulatory Visit (HOSPITAL_COMMUNITY)
Admission: RE | Admit: 2024-01-30 | Discharge: 2024-01-30 | Disposition: A | Discharge status: Home / Self Care | Payer: Federal, State, Local not specified - PPO | Source: Ambulatory Visit | Attending: Cardiology | Admitting: Cardiology

## 2024-01-30 DIAGNOSIS — I517 Cardiomegaly: Secondary | ICD-10-CM | POA: Insufficient documentation

## 2024-01-30 MED ORDER — GADOBUTROL 1 MMOL/ML IV SOLN
10.0000 mL | Freq: Once | INTRAVENOUS | Status: AC | PRN
  Administered 2024-01-30: 10 mL via INTRAVENOUS

## 2024-02-14 ENCOUNTER — Telehealth: Payer: Self-pay

## 2024-02-14 DIAGNOSIS — I421 Obstructive hypertrophic cardiomyopathy: Secondary | ICD-10-CM

## 2024-02-14 NOTE — Telephone Encounter (Signed)
Pt states that he gets palpations when he lifts over 25 pounds and would like a work note for same. Please advise

## 2024-02-14 NOTE — Telephone Encounter (Signed)
-----   Message from Garwin Brothers sent at 02/13/2024 12:05 PM EST ----- Best to schedule appointment with Dr. Raynelle Jan who specializes in this Garwin Brothers, MD 02/13/2024 12:05 PM

## 2024-02-14 NOTE — Telephone Encounter (Signed)
 Left vm

## 2024-02-14 NOTE — Addendum Note (Signed)
Addended by: Eleonore Chiquito on: 02/14/2024 12:29 PM   Modules accepted: Orders

## 2024-02-28 ENCOUNTER — Emergency Department (HOSPITAL_COMMUNITY)

## 2024-02-28 ENCOUNTER — Other Ambulatory Visit: Payer: Self-pay

## 2024-02-28 ENCOUNTER — Emergency Department (HOSPITAL_COMMUNITY)
Admission: EM | Admit: 2024-02-28 | Discharge: 2024-02-28 | Disposition: A | Discharge status: Home / Self Care | Attending: Emergency Medicine | Admitting: Emergency Medicine

## 2024-02-28 DIAGNOSIS — Z20822 Contact with and (suspected) exposure to covid-19: Secondary | ICD-10-CM | POA: Insufficient documentation

## 2024-02-28 DIAGNOSIS — Z7951 Long term (current) use of inhaled steroids: Secondary | ICD-10-CM | POA: Diagnosis not present

## 2024-02-28 DIAGNOSIS — J4521 Mild intermittent asthma with (acute) exacerbation: Secondary | ICD-10-CM | POA: Diagnosis not present

## 2024-02-28 DIAGNOSIS — J3489 Other specified disorders of nose and nasal sinuses: Secondary | ICD-10-CM | POA: Insufficient documentation

## 2024-02-28 DIAGNOSIS — R058 Other specified cough: Secondary | ICD-10-CM | POA: Diagnosis not present

## 2024-02-28 DIAGNOSIS — J4531 Mild persistent asthma with (acute) exacerbation: Secondary | ICD-10-CM

## 2024-02-28 DIAGNOSIS — J45901 Unspecified asthma with (acute) exacerbation: Secondary | ICD-10-CM | POA: Diagnosis not present

## 2024-02-28 DIAGNOSIS — R0602 Shortness of breath: Secondary | ICD-10-CM | POA: Diagnosis not present

## 2024-02-28 DIAGNOSIS — J45909 Unspecified asthma, uncomplicated: Secondary | ICD-10-CM

## 2024-02-28 DIAGNOSIS — J4541 Moderate persistent asthma with (acute) exacerbation: Secondary | ICD-10-CM

## 2024-02-28 LAB — CBC WITH DIFFERENTIAL/PLATELET
Abs Immature Granulocytes: 0.02 10*3/uL (ref 0.00–0.07)
Basophils Absolute: 0.1 10*3/uL (ref 0.0–0.1)
Basophils Relative: 1 %
Eosinophils Absolute: 0.3 10*3/uL (ref 0.0–0.5)
Eosinophils Relative: 4 %
HCT: 48.1 % (ref 39.0–52.0)
Hemoglobin: 15.7 g/dL (ref 13.0–17.0)
Immature Granulocytes: 0 %
Lymphocytes Relative: 14 %
Lymphs Abs: 1.1 10*3/uL (ref 0.7–4.0)
MCH: 29.1 pg (ref 26.0–34.0)
MCHC: 32.6 g/dL (ref 30.0–36.0)
MCV: 89.2 fL (ref 80.0–100.0)
Monocytes Absolute: 0.6 10*3/uL (ref 0.1–1.0)
Monocytes Relative: 8 %
Neutro Abs: 5.6 10*3/uL (ref 1.7–7.7)
Neutrophils Relative %: 73 %
Platelets: 173 10*3/uL (ref 150–400)
RBC: 5.39 MIL/uL (ref 4.22–5.81)
RDW: 13 % (ref 11.5–15.5)
WBC: 7.8 10*3/uL (ref 4.0–10.5)
nRBC: 0 % (ref 0.0–0.2)

## 2024-02-28 LAB — BASIC METABOLIC PANEL
Anion gap: 11 (ref 5–15)
BUN: 12 mg/dL (ref 6–20)
CO2: 25 mmol/L (ref 22–32)
Calcium: 9.4 mg/dL (ref 8.9–10.3)
Chloride: 103 mmol/L (ref 98–111)
Creatinine, Ser: 1.01 mg/dL (ref 0.61–1.24)
GFR, Estimated: >60 mL/min (ref >60)
Glucose, Bld: 75 mg/dL (ref 70–99)
Potassium: 4.4 mmol/L (ref 3.5–5.1)
Sodium: 139 mmol/L (ref 135–145)

## 2024-02-28 LAB — RESP PANEL BY RT-PCR (RSV, FLU A&B, COVID)  RVPGX2
Influenza A by PCR: NEGATIVE
Influenza B by PCR: NEGATIVE
Resp Syncytial Virus by PCR: NEGATIVE
SARS Coronavirus 2 by RT PCR: NEGATIVE

## 2024-02-28 MED ORDER — FLUTICASONE PROPIONATE 50 MCG/ACT NA SUSP: 1.0000 | Freq: Every day | NASAL | 0 refills | Status: AC

## 2024-02-28 MED ORDER — ALBUTEROL SULFATE HFA 108 (90 BASE) MCG/ACT IN AERS
2.0000 | INHALATION_SPRAY | RESPIRATORY_TRACT | Status: DC | PRN
  Administered 2024-02-28: 2 via RESPIRATORY_TRACT
  Filled 2024-02-28: qty 6.7

## 2024-02-28 MED ORDER — PREDNISONE 20 MG PO TABS
60.0000 mg | ORAL_TABLET | Freq: Once | ORAL | Status: AC
  Administered 2024-02-28: 60 mg via ORAL
  Filled 2024-02-28: qty 3

## 2024-02-28 MED ORDER — IPRATROPIUM-ALBUTEROL 0.5-2.5 (3) MG/3ML IN SOLN
3.0000 mL | Freq: Once | RESPIRATORY_TRACT | Status: AC
  Administered 2024-02-28: 3 mL via RESPIRATORY_TRACT
  Filled 2024-02-28: qty 3

## 2024-02-28 MED ORDER — ALBUTEROL SULFATE HFA 108 (90 BASE) MCG/ACT IN AERS: INHALATION_SPRAY | RESPIRATORY_TRACT | 3 refills | Status: AC

## 2024-02-28 MED ORDER — ALBUTEROL SULFATE (2.5 MG/3ML) 0.083% IN NEBU: 2.5000 mg | INHALATION_SOLUTION | RESPIRATORY_TRACT | 3 refills | Status: DC | PRN

## 2024-02-28 MED ORDER — BUDESONIDE-FORMOTEROL FUMARATE 80-4.5 MCG/ACT IN AERO: INHALATION_SPRAY | RESPIRATORY_TRACT | 12 refills | Status: AC

## 2024-02-28 MED ORDER — PREDNISONE 10 MG PO TABS: 40.0000 mg | ORAL_TABLET | Freq: Every day | ORAL | 0 refills | Status: DC

## 2024-02-28 NOTE — Discharge Instructions (Signed)
 You were seen in the emergency department for your shortness of breath.  You were wheezing consistent with an asthma exacerbation.  Your labs, viral swab and x-ray were all normal.  I have given you 4 more days of steroids he should complete this as prescribed as well as refills of your inhalers and nebulizer medication that he can continue to use as needed.  You should also use the Flonase for your congestion and continue to take your home Zyrtec.  You can follow-up with your primary doctor to have your symptoms rechecked.  You should return to the emergency department if you have significantly worsening shortness of breath, severe chest pain or any other new or concerning symptoms.

## 2024-02-28 NOTE — ED Triage Notes (Signed)
 Pt arrived via POV. C/o asthma exacerbation that began 4x days ago. Rescue inhaler has had no improved symptoms.  Productive cough for several days.

## 2024-02-28 NOTE — ED Provider Triage Note (Signed)
 Emergency Medicine Provider Triage Evaluation Note  Austin Curry , a 43 y.o. male  was evaluated in triage.  Pt complains of SOB and cough.  Review of Systems  Positive:  Negative:   Physical Exam  BP (!) 131/95 (BP Location: Left Arm)   Pulse 94   Temp 99.4 F (37.4 C) (Oral)   Resp 16   Ht 6\' 2"  (1.88 m)   Wt 80.3 kg   SpO2 98%   BMI 22.73 kg/m  Gen:   Awake, no distress   Resp:  Normal effort  MSK:   Moves extremities without difficulty  Other:    Medical Decision Making  Medically screening exam initiated at 3:47 PM.  Appropriate orders placed.  Austin Curry was informed that the remainder of the evaluation will be completed by another provider, this initial triage assessment does not replace that evaluation, and the importance of remaining in the ED until their evaluation is complete.  Worsening cough and SOB x4 days. No fever, nausea, vomiting, diarrhea. Hx asthma. Rescue inhaler not helping.   Dorthy Cooler, [place] 02/28/24 [phone]

## 2024-02-28 NOTE — ED Provider Notes (Signed)
 [place] [facility] AT [facility] Provider Note   CSN: [id number] Arrival date & time: 02/28/24  1446     History  Chief Complaint  Patient presents with   Asthma    Austin Curry is a 43 y.o. male.  Patient is a 43 year old male with a past medical history of asthma and allergies presenting to the emergency department with shortness of breath.  The patient states that he had been feeling sick since last week.  He states that he has been coughing and congested.  He states initially he thought it was his allergies and increased his Zyrtec without any improvement and then thought he might have a cold and took his over-the-counter cold medication without any improvement.  He states that he has been out of his inhalers are at home and is concerned this could be his asthma.  He denies any fevers.  He states that he was initially coughing up mucus and now it has been clear.  He states he reports chest pain only when he coughs.  The history is provided by the patient.  Asthma       Home Medications Prior to Admission medications   Medication Sig Start Date End Date Taking? Authorizing Provider  predniSONE (DELTASONE) 10 MG tablet Take 4 tablets (40 mg total) by mouth daily. 02/28/24  Yes Elayne Snare K, DO  acetaminophen (TYLENOL) 500 MG tablet Take 500 mg by mouth every 6 (six) hours as needed for mild pain (pain score 1-3) or moderate pain (pain score 4-6).    [provider]  albuterol (PROVENTIL) (2.5 MG/3ML) 0.083% nebulizer solution Take 3 mLs (2.5 mg total) by nebulization every 4 (four) hours as needed for wheezing or shortness of breath. 02/28/24   Rexford Maus, DO  albuterol (VENTOLIN HFA) 108 [phone] Base) MCG/ACT inhaler 2 puffs 4 times daily as needed 02/28/24   Elayne Snare K, DO  budesonide-formoterol [facility]) 80-4.5 MCG/ACT inhaler 2 puffs in the morning AS NEEDED right when you wake up, rinse out your mouth after use, Can  repeat in 12 hours later 2 puffs AS NEEDED, rinse after use 02/28/24   Theresia Lo, [place] K, DO  buPROPion (WELLBUTRIN XL) 300 MG 24 hr tablet Take 1 tablet (300 mg total) by mouth daily. 12/12/23   Nafziger, Kandee Keen, NP  cetirizine (ZYRTEC) 10 MG tablet Take 1 tablet (10 mg total) by mouth daily as needed. 02/15/23   Olalere, Minna Antis, MD  fluticasone (FLONASE) 50 MCG/ACT nasal spray Place 1 spray into both nostrils daily. 02/28/24   Rexford Maus, DO  Multiple Vitamins-Minerals (CENTRUM MEN PO) Take 1 each by mouth daily.    [provider]      Allergies    Patient has no known allergies.    Review of Systems   Review of Systems  Physical Exam Updated Vital Signs BP (!) 132/91   Pulse 67   Temp 98.3 F (36.8 C) (Oral)   Resp 18   Ht 6\' 2"  (1.88 m)   Wt 80.3 kg   SpO2 98%   BMI 22.73 kg/m  Physical Exam Vitals and nursing note reviewed.  Constitutional:      General: He is not in acute distress.    Appearance: Normal appearance.  HENT:     Head: Normocephalic and atraumatic.     Nose: Rhinorrhea present.     Mouth/Throat:     Mouth: Mucous membranes are moist.     Pharynx: Oropharynx is clear.  Eyes:     Extraocular Movements: Extraocular movements intact.     Conjunctiva/sclera: Conjunctivae normal.  Cardiovascular:     Rate and Rhythm: Normal rate and regular rhythm.     Heart sounds: Normal heart sounds.  Pulmonary:     Effort: Pulmonary effort is normal. No respiratory distress.     Breath sounds: Wheezing (Mild inspiratory and significant expiratory) present.  Abdominal:     General: Abdomen is flat.     Palpations: Abdomen is soft.     Tenderness: There is no abdominal tenderness.  Musculoskeletal:        General: Normal range of motion.     Cervical back: Normal range of motion.  Skin:    General: Skin is warm and dry.  Neurological:     General: No focal deficit present.     Mental Status: He is alert and oriented to person, place, and time.   Psychiatric:        Mood and Affect: Mood normal.        Behavior: Behavior normal.     ED Results / Procedures / Treatments   Labs (all labs ordered are listed, but only abnormal results are displayed) Labs Reviewed  RESP PANEL BY RT-PCR (RSV, FLU A&B, COVID)  [id number]  CBC WITH DIFFERENTIAL/PLATELET  BASIC METABOLIC PANEL    EKG EKG Interpretation Date/Time:  Wednesday February 28 2024 15:42:40 EST Ventricular Rate:  84 PR Interval:  143 QRS Duration:  110 QT Interval:  355 QTC Calculation: 420 R Axis:   113  Text Interpretation: Sinus rhythm Left posterior fascicular block ST elevation, consider early repolarization No significant change since last tracing Confirmed by Elayne Snare ([id number]) on 02/28/2024 8:08:07 PM  Radiology DG Chest 2 View Result Date: 02/28/2024 CLINICAL DATA:  Shortness of breath and productive cough for several days. Asthma exacerbation. EXAM: CHEST - 2 VIEW COMPARISON:  11/30/2021 FINDINGS: The heart size and mediastinal contours are within normal limits. Both lungs are clear. The visualized skeletal structures are unremarkable. IMPRESSION: No active cardiopulmonary disease. Electronically Signed   By: Danae Orleans M.D.   On: 02/28/2024 19:04    Procedures Procedures    Medications Ordered in ED Medications  albuterol (VENTOLIN HFA) 108 (90 Base) MCG/ACT inhaler 2 puff (2 puffs Inhalation Given 02/28/24 1611)  ipratropium-albuterol (DUONEB) 0.5-2.5 (3) MG/3ML nebulizer solution 3 mL (3 mLs Nebulization Given 02/28/24 1607)  predniSONE (DELTASONE) tablet 60 mg (60 mg Oral Given 02/28/24 2041)  ipratropium-albuterol (DUONEB) 0.5-2.5 (3) MG/3ML nebulizer solution 3 mL (3 mLs Nebulization Given 02/28/24 2041)    ED Course/ Medical Decision Making/ A&P                                 Medical Decision Making This patient presents to the ED with chief complaint(s) of shortness of breath  With pertinent past medical history of asthma, allergies which further  complicates the presenting complaint. The complaint involves an extensive differential diagnosis and also carries with it a high risk of complications and morbidity.    The differential diagnosis includes patient has wheezing consistent with asthma exacerbation, considering pneumonia, viral syndrome, allergies  Additional history obtained: Additional history obtained from N/A Records reviewed outpatient cardiology records  ED Course and Reassessment: On patient's arrival he is hemodynamically stable in no acute distress.  He was initially evaluated by provider in triage and had EKG, labs and chest x-ray performed.  EKG showed  normal sinus rhythm with no changes and labs including viral swab are within normal range.  Chest x-ray showed no acute changes.  Patient was given albuterol inhaler and DuoNeb in the waiting room with some improvement as well as history of wheeze on exam will be given steroids and additional DuoNeb now and will be closely reassessed.  Independent labs interpretation:  The following labs were independently interpreted: within normal range  Independent visualization of imaging: - I independently visualized the following imaging with scope of interpretation limited to determining acute life threatening conditions related to emergency care: CXR, which revealed no acute disease  Consultation: - Consulted or discussed management/test interpretation w/ external professional: N/A  Consideration for admission or further workup: Patient has no emergent conditions requiring admission or further work-up at this time and is stable for discharge home with primary care follow-up  Social Determinants of health: N/A    Risk Prescription drug management.          Final Clinical Impression(s) / ED Diagnoses Final diagnoses:  Mild intermittent asthma with exacerbation  Asthma due to environmental allergies    Rx / DC Orders ED Discharge Orders          Ordered     predniSONE (DELTASONE) 10 MG tablet  Daily        02/28/24 2141    albuterol (PROVENTIL) (2.5 MG/3ML) 0.083% nebulizer solution  Every 4 hours PRN       Note to Pharmacy: Dx: J45.909   02/28/24 2141    albuterol (VENTOLIN HFA) 108 (90 Base) MCG/ACT inhaler        02/28/24 2141    budesonide-formoterol (SYMBICORT) 80-4.5 MCG/ACT inhaler        02/28/24 2141    fluticasone (FLONASE) 50 MCG/ACT nasal spray  Daily        02/28/24 2141              Rexford Maus, DO 02/28/24 2144

## 2024-02-29 ENCOUNTER — Telehealth: Payer: Self-pay

## 2024-02-29 NOTE — Transitions of Care (Post Inpatient/ED Visit) (Signed)
   02/29/2024  Name: Austin Curry MRN: [id number] DOB: 1981/01/26  Today's TOC FU Call Status: Today's TOC FU Call Status:: Successful TOC FU Call Completed TOC FU Call Complete Date: 02/29/24 Patient's Name and Date of Birth confirmed.  Transition Care Management Follow-up Telephone Call Date of Discharge: 02/28/24 Discharge Facility: [facility]) Type of Discharge: Emergency Department Reason for ED Visit: Other: (asthma) How have you been since you were released from the hospital?: Better Any questions or concerns?: No  Items Reviewed: Did you receive and understand the discharge instructions provided?: Yes Medications obtained,verified, and reconciled?: Yes (Medications Reviewed) Any new allergies since your discharge?: No Dietary orders reviewed?: Yes Do you have support at home?: Yes People in Home: parent(s)  Medications Reviewed Today: Medications Reviewed Today     Reviewed by Karena Addison, LPN (Licensed Practical Nurse) on 02/29/24 at 1108  Med List Status: <None>   Medication Order Taking? Sig Documenting Provider Last Dose Status Informant  acetaminophen (TYLENOL) 500 MG tablet [id number] No Take 500 mg by mouth every 6 (six) hours as needed for mild pain (pain score 1-3) or moderate pain (pain score 4-6). [provider] Taking Active   albuterol (PROVENTIL) (2.5 MG/3ML) 0.083% nebulizer solution [id number]  Take 3 mLs (2.5 mg total) by nebulization every 4 (four) hours as needed for wheezing or shortness of breath. Rexford Maus, DO  Active   albuterol (VENTOLIN HFA) 108 [phone] Base) MCG/ACT inhaler [id number]  2 puffs 4 times daily as needed Rexford Maus, DO  Active   budesonide-formoterol [facility]) 80-4.5 MCG/ACT inhaler [id number]  2 puffs in the morning AS NEEDED right when you wake up, rinse out your mouth after use, Can repeat in 12 hours later 2 puffs AS NEEDED, rinse after use Theresia Lo, Victoria K, DO  Active   buPROPion (WELLBUTRIN  XL) 300 MG 24 hr tablet [id number]  Take 1 tablet (300 mg total) by mouth daily. Nafziger, Kandee Keen, NP  Active   cetirizine (ZYRTEC) 10 MG tablet [id number] No Take 1 tablet (10 mg total) by mouth daily as needed. Tomma Lightning, MD Taking Active   fluticasone (FLONASE) 50 MCG/ACT nasal spray [id number]  Place 1 spray into both nostrils daily. Elayne Snare K, DO  Active   Multiple Vitamins-Minerals (CENTRUM MEN PO) [id number] No Take 1 each by mouth daily. [provider] Taking Active   predniSONE (DELTASONE) 10 MG tablet [id number]  Take 4 tablets (40 mg total) by mouth daily. Rexford Maus, DO  Active             Home Care and Equipment/Supplies: Were Home Health Services Ordered?: NA Any new equipment or medical supplies ordered?: NA  Functional Questionnaire: Do you need assistance with bathing/showering or dressing?: No Do you need assistance with meal preparation?: No Do you need assistance with eating?: No Do you have difficulty maintaining continence: No Do you need assistance with getting out of bed/getting out of a chair/moving?: No Do you have difficulty managing or taking your medications?: No  Follow up appointments reviewed: PCP Follow-up appointment confirmed?: Yes Date of PCP follow-up appointment?: 03/05/24 Follow-up Provider: [facility] Follow-up appointment confirmed?: NA Do you need transportation to your follow-up appointment?: No Do you understand care options if your condition(s) worsen?: Yes-patient verbalized understanding    SIGNATURE Karena Addison, LPN [facility] Nurse Health Advisor Direct Dial [phone]

## 2024-03-05 ENCOUNTER — Ambulatory Visit (INDEPENDENT_AMBULATORY_CARE_PROVIDER_SITE_OTHER): Admitting: Adult Health

## 2024-03-05 VITALS — BP 118/70 | HR 65 | Temp 97.9°F | Ht 74.0 in | Wt 176.6 lb

## 2024-03-05 DIAGNOSIS — J069 Acute upper respiratory infection, unspecified: Secondary | ICD-10-CM

## 2024-03-05 MED ORDER — AZITHROMYCIN 250 MG PO TABS: ORAL_TABLET | ORAL | 0 refills | Status: AC

## 2024-03-05 MED ORDER — PREDNISONE 10 MG PO TABS: ORAL_TABLET | ORAL | 0 refills | Status: DC

## 2024-03-05 NOTE — Progress Notes (Signed)
 Subjective:    Patient ID: Austin Curry, male    DOB: 03-23-1981, 43 y.o.   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  He presents to the office today for follow-up after being seen in the emergency room 6 days ago with shortness of breath.  He had been not feeling well for roughly 1 week with coughing and congestion.  At home he thought it may be allergies so he increased his Zyrtec without any improvement and then started over-the-counter cold and cough medications for suspected cold.  He had been out of his inhalers at home.  He was not having any fevers.  Was initially coughing up mucus but that developed into a clear sputum.  On exam he was positive for wheezing.  EKG showed normal sinus rhythm.  Chest x-ray no acute acute changes. He was negative for   He was given albuterol inhaler and DuoNeb with some improvement.  Discharged on prednisone 40 mg daily x 4 days.   Today he reports that he continues to feel wheezy, shortness of breath and has a  semi productive cough. He does get some mild relief from using his inhalers but did not see much improvement in his symptoms with the 4 days steroids.   He has not had any fevers, chills.    He has an appointment with allergy and asthma in one week.    Review of Systems See HPI   Past Medical History:  Diagnosis Date   Allergic rhinitis 10/31/2023   Allergic rhinitis due to animal (cat) (dog) hair and dander 10/31/2023   Asthma exacerbation 04/08/2016   PFT's  08/23/16   FEV1 3.97 (94 % ) ratio 73  p 17 % improvement from saba p nothing prior to study  - 02/27/2017  After extensive coaching HFA effectiveness =    90% > change to symb 160 2bid   - Allergy profile 02/27/2017 >  Eos 0.3 /  IgE  595 multiple Pos RAST      Asthma, chronic 06/25/2013   02/27/2017-respiratory allergy panel-multiple elevations, highest with [place] grass, Box Elder IgE, common silver birch, oak, pecan hickory tree, ragweed, sheep Sorrell, rough pigweed, IgE-595     02/27/2017-CBC with differential-eosinophils relative 4.2, eosinophils absolute 0.3     08/23/2016-pulmonary function test-FVC FVC 5.0 (98% predicted), postbronchodilator ratio 73, postbronchodilator FEV1 3.   Environmental allergies    Essential hypertension 06/22/2018   Essential hypertension     History of environmental allergies 03/03/2020   02/27/2017-respiratory allergy panel-multiple elevations, highest with [place] grass, Box Elder IgE, common silver birch, oak, pecan hickory tree, ragweed, sheep Sorrell, rough pigweed, IgE-595     02/27/2017-CBC with differential-eosinophils relative 4.2, eosinophils absolute 0.3        History of hypertension    Injury of ligament of right knee    right medial patellofemoral ligament tear   Instability of right patellofemoral joint  07/30/2020   Intermittent palpitations 01-12-2021  per pt have been taking toprol since 09/ 2021 did not think he needed any more, only feel palpitations when lifting something heavy   previous seen by cardiology,  dr Allyson Sabal, lov 06-26-2019 pt was released prn basis;  event monitor 06-09-2018 showed ns/ st   Moderate persistent asthma    pulmonology---  b. Cherre Huger  NP  (01-12-2021 per  pt last exacerbation approxl. 05/ 2021)   Pain in right knee 06/17/2019   Palpitations 05/30/2018   Palpitations     Right shoulder pain 11/23/2017   Snoring 01/27/2023   Ventral hernia 01/12/2016    Social History   Socioeconomic History   Marital status: Married    Spouse name: Not on file   Number of children: Not on file   Years of education: Not on file   Highest education level: Bachelor's degree (e.g., BA, AB, BS)  Occupational History   Occupation: Industrial/product designer: [employer]  Tobacco Use   Smoking status: Former    Current packs/day: 0.00    Average packs/day: 0.5 packs/day for 15.0 years (7.5 ttl pk-yrs)    Types: Cigarettes    Start date: 12/26/2006    Quit date: 12/26/2021    Years since quitting: 2.1   Smokeless tobacco: Never  Vaping Use   Vaping status: Never Used  Substance and Sexual Activity   Alcohol use: Not Currently    Alcohol/week: 0.0 standard drinks of alcohol    Comment: occasional   Drug use: Never   Sexual activity: Not on file  Other Topics Concern   Not on file  Social History Narrative   Married    No children       Social Drivers of Health   Financial Resource Strain: Medium Risk (12/12/2023)   Overall Financial Resource Strain (CARDIA)    Difficulty of Paying Living Expenses: Somewhat hard  Food Insecurity: Food Insecurity Present (12/12/2023)   Hunger Vital Sign    Worried About Running Out of Food in the Last Year: Sometimes true    Ran Out of Food in the Last Year: Patient declined  Transportation Needs: No Transportation Needs (12/12/2023)   PRAPARE - Administrator, Civil Service (Medical): No    Lack of Transportation (Non-Medical): No  Physical Activity: Sufficiently Active (12/12/2023)   Exercise Vital Sign    Days of Exercise per Week: 5 days    Minutes of Exercise per Session: 150+ min  Stress: Stress Concern Present (12/12/2023)   [employer] of Occupational Health - Occupational Stress  Questionnaire    Feeling of Stress : To some extent  Social Connections: Moderately Integrated (12/12/2023)   Social Connection and Isolation Panel [NHANES]    Frequency of Communication with Friends and Family: More than three times a week    Frequency of Social Gatherings with Friends and Family: Once a week    Attends Religious Services: More than 4 times per year    Active Member of [employer] or Organizations: Yes    Attends Banker Meetings: 1 to 4 times per year    Marital Status: Separated  Intimate Partner Violence: Unknown (09/13/2022)   Received from [employer], [facility]   HITS    Physically Hurt: Not on file    Insult or Talk Down To: Not on file    Threaten Physical Harm: Not on file    Scream or Curse: Not on file    Past  Surgical History:  Procedure Laterality Date   INSERTION OF MESH N/A 04/18/2016   Procedure: INSERTION OF MESH;  Surgeon: Abigail Miyamoto, MD;  Location: [facility];  Service: General;  Laterality: N/A;   KNEE ARTHROSCOPY WITH MEDIAL PATELLAR FEMORAL LIGAMENT RECONSTRUCTION Right 02/18/2021   Procedure: RIGHT KNEE ARTHROSCOPYLIMITED SYNOVECTOMY WITH MEDIAL PATELLAR FEMORAL LIGAMENT RECONSTRUCTION WITH HAMSTRING ALLOGRAFT;  Surgeon: Yolonda Kida, MD;  Location: [facility];  Service: Orthopedics;  Laterality: Right;  2 HRS   VENTRAL HERNIA REPAIR N/A 04/18/2016   Procedure:  VENTRAL HERNIA REPAIR;  Surgeon: Abigail Miyamoto, MD;  Location: [facility];  Service: General;  Laterality: N/A;    Family History  Problem Relation Age of Onset   Diabetes Mother    Hypertension Mother    CVA Mother    Hypercholesterolemia Mother    Hyperlipidemia Father    Asthma Brother    Asthma Brother     No Known Allergies  Current Outpatient Medications on File Prior to Visit  Medication Sig Dispense Refill   acetaminophen (TYLENOL) 500 MG tablet Take 500 mg by mouth every 6 (six) hours as needed for mild pain (pain score 1-3) or  moderate pain (pain score 4-6).     albuterol (PROVENTIL) (2.5 MG/3ML) 0.083% nebulizer solution Take 3 mLs (2.5 mg total) by nebulization every 4 (four) hours as needed for wheezing or shortness of breath. 75 mL 3   albuterol (VENTOLIN HFA) 108 (90 Base) MCG/ACT inhaler 2 puffs 4 times daily as needed 18 g 3   budesonide-formoterol (SYMBICORT) 80-4.5 MCG/ACT inhaler 2 puffs in the morning AS NEEDED right when you wake up, rinse out your mouth after use, Can repeat in 12 hours later 2 puffs AS NEEDED, rinse after use 1 each 12   buPROPion (WELLBUTRIN XL) 300 MG 24 hr tablet Take 1 tablet (300 mg total) by mouth daily. 90 tablet 1   cetirizine (ZYRTEC) 10 MG tablet Take 1 tablet (10 mg total) by mouth daily as needed. 90 tablet 3   fluticasone (FLONASE) 50 MCG/ACT nasal spray Place 1 spray into both nostrils daily. 15.8 mL 0   Multiple Vitamins-Minerals (CENTRUM MEN PO) Take 1 each by mouth daily.     predniSONE (DELTASONE) 10 MG tablet Take 4 tablets (40 mg total) by mouth daily. (Patient not taking: Reported on 03/05/2024) 16 tablet 0   No current facility-administered medications on file prior to visit.    BP 118/70   Pulse 65   Temp 97.9 F (36.6 C) (Oral)   Ht 6\' 2"  (1.88 m)   Wt 176 lb 9.6 oz (80.1 kg)   SpO2 95%   BMI 22.67 kg/m       Objective:   Physical Exam Vitals and nursing note reviewed.  Constitutional:      Appearance: Normal appearance.  Cardiovascular:     Rate and Rhythm: Normal rate and regular rhythm.     Pulses: Normal pulses.     Heart sounds: Normal heart sounds.  Pulmonary:     Effort: Pulmonary effort is normal.     Breath sounds: No stridor. Wheezing present. No rhonchi.  Musculoskeletal:        General: Normal range of motion.  Skin:    General: Skin is warm and dry.     Capillary Refill: Capillary refill takes less than 2 seconds.  Neurological:     General: No focal deficit present.     Mental Status: He is alert and oriented to person, place,  and time.  Psychiatric:        Mood and Affect: Mood normal.        Behavior: Behavior normal.        Thought Content: Thought content normal.        Judgment: Judgment normal.       Assessment & Plan:  1. Upper respiratory tract infection, unspecified type (Primary) Asthma vs URI. Will place on longer course of prednisone and add Azithromycin  - Follow up as needed - predniSONE (DELTASONE) 10 MG tablet; 40 mg x 3 days, 20 mg x 3 days, 10 mg x 3 days  Dispense: 21 tablet; Refill: 0 - azithromycin (ZITHROMAX) 250 MG tablet; Take 2 tablets on day 1, then 1 tablet daily on days 2 through 5  Dispense: 6 tablet; Refill: 0  Shirline Frees, NP

## 2024-04-03 ENCOUNTER — Encounter: Payer: Self-pay | Admitting: Internal Medicine

## 2024-04-03 ENCOUNTER — Ambulatory Visit: Payer: Federal, State, Local not specified - PPO | Admitting: Genetic Counselor

## 2024-04-03 ENCOUNTER — Ambulatory Visit: Payer: Federal, State, Local not specified - PPO | Attending: Internal Medicine | Admitting: Internal Medicine

## 2024-04-03 VITALS — BP 114/80 | HR 87 | Ht 74.0 in | Wt 173.6 lb

## 2024-04-03 VITALS — BP 114/80 | Temp 98.0°F | Ht 74.0 in | Wt 173.0 lb

## 2024-04-03 DIAGNOSIS — Z8241 Family history of sudden cardiac death: Secondary | ICD-10-CM | POA: Diagnosis not present

## 2024-04-03 DIAGNOSIS — I421 Obstructive hypertrophic cardiomyopathy: Secondary | ICD-10-CM

## 2024-04-03 DIAGNOSIS — R002 Palpitations: Secondary | ICD-10-CM | POA: Diagnosis not present

## 2024-04-03 NOTE — Patient Instructions (Addendum)
 Medication Instructions:  Your physician recommends that you continue on your current medications as directed. Please refer to the Current Medication list given to you today.  *If you need a refill on your cardiac medications before your next appointment, please call your pharmacy*  Lab Work: NONE  If you have labs (blood work) drawn today and your tests are completely normal, you will receive your results only by: MyChart Message (if you have MyChart) OR A paper copy in the mail If you have any lab test that is abnormal or we need to change your treatment, we will call you to review the results.  Testing/Procedures: NONE  Follow-Up: At [facility], you and your health needs are our priority.  As part of our continuing mission to provide you with exceptional heart care, our providers are all part of one team.  This team includes your primary Cardiologist (physician) and Advanced Practice Providers or APPs (Physician Assistants and Nurse Practitioners) who all work together to provide you with the care you need, when you need it.  Your next appointment:   12 month(s)  Provider:   Riley Lam, MD    Other Instructions    1st Floor: - Lobby - Registration  - Pharmacy  - Lab - Cafe  2nd Floor: - PV Lab - Diagnostic Testing (echo, CT, nuclear med)  3rd Floor: - Vacant  4th Floor: - TCTS (cardiothoracic surgery) - AFib Clinic - Structural Heart Clinic - Vascular Surgery  - Vascular Ultrasound  5th Floor: - HeartCare Cardiology (general and EP) - Clinical Pharmacy for coumadin, hypertension, lipid, weight-loss medications, and med management appointments    Valet parking services will be available as well.

## 2024-04-03 NOTE — Addendum Note (Signed)
 Addended by: Macie Burows on: 04/03/2024 05:58 PM   Modules accepted: Orders

## 2024-04-03 NOTE — Progress Notes (Signed)
 Cardiology Office Note:  .    Date:  04/03/2024  ID:  [name], DOB 11-15-1981, MRN [id number] PCP: Shirline Frees, NP  [facility] Providers Cardiologist:  None     CC: Explanations for exercise sx Consult Questions: HCM Eval  History of Present Illness: .    YAMIN SWINGLER is a 43 y.o. male with family history of SCD (Uncle), FHX of HFpEF, HX of P-SVT with query of HCM   GIANNY SABINO is a 43 year old male who presents for evaluation of hypertrophic cardiomyopathy and symptom review.  He has a history of septal thickness measuring 15 to 16 millimeters by MRI and 20 millimeters by echocardiogram, with no evidence of left ventricular outflow tract obstruction. He is attending the hypertrophic cardiomyopathy clinic to evaluate the presence of hypertrophic cardiomyopathy versus a hypertensive condition or phenocopy.  Since 2019, he experiences symptoms particularly when lifting heavy objects at work, such as packages weighing over 25 pounds. His heart rate elevates as if he had been running, despite only performing routine tasks. A previous heart monitor did not reveal any abnormalities.  He experiences fatigue and questions why he feels 'always so tired.' No chest pain except when associated with asthma. He has a history of a severe asthma attack in 2017, during which his oxygen levels dropped significantly, but he remained coherent and avoided panic.  His family history includes his mother, who was diagnosed with congestive heart failure, and an uncle who passed away during open-heart surgery following a traumatic accident. His father also sees a heart doctor but does not have structural heart problems. He has two biological brothers and two adopted siblings.  He works for the [employer], where his job involves physical activity, which he considers his exercise. He feels more tired on hot days but not significantly more tired after large meals. He weighs  approximately 174 pounds.   Relevant histories: .  Social- his mother is a patient of Dr. Antoine Poche   Discussed the use of AI scribe software for clinical note transcription with the patient, who gave verbal consent to proceed.  ROS: As per HPI.   Studies Reviewed: .   Cardiac Studies & Procedures   ______________________________________________________________________________________________   STRESS TESTS  ECHOCARDIOGRAM STRESS TEST 11/22/2023  Narrative EXERCISE STRESS ECHO REPORT   --------------------------------------------------------------------------------  Patient Name:   [name] Date of Exam: 11/22/2023 Medical Rec #:  [id number]       Height:       74.0 in Accession #:    [id number]      Weight:       167.0 lb Date of Birth:  1981/05/08       BSA:          2.013 m Patient Age:    42 years        BP:           138/70 mmHg Patient Gender: M               HR:           78 bpm. Exam Location:  [place]  Procedure: Stress Echo, Cardiac Doppler and Color Doppler  Indications:    Palpitations [R00.2 (ICD-10-CM)]; Dyspnea on exertion [R06.09 (ICD-10-CM)]  History:        Patient has no prior history of Echocardiogram examinations.  Sonographer:    Louie Boston RDCS Referring Phys: Rito Ehrlich [facility]   Sonographer Comments: * IMPRESSIONS  1. This is a negative stress echocardiogram for ischemia. 2. This is a low risk study. 3. No significant LVOT obstruction at baseline, peak gradient of 6 mm HG observed. 4. Normal biventricular function observed. Mild TR and trace MR observed. Estimated RVSP 20mm Hg. 5. Severe asymmetric left ventricular hypertrophy observed. IVSd thickness 2cm.  Conclusion(s)/Recommendation(s): Consider dedicated TTE or cardiac MRI to assess for hypertrophic cardiomyopathy.  FINDINGS  Exam Protocol: The patient exercised on a treadmill according to a Bruce protocol.   Patient Performance: The patient exercised for 12 minutes  and 24 seconds, achieving 14 METS. The maximum stage achieved was IV of the Bruce protocol. The baseline heart rate was 63 bpm. The heart rate at peak stress was 171 bpm. The target heart rate was calculated to be 151 bpm. The percentage of maximum predicted heart rate achieved was 96.0 %. The baseline blood pressure was 137/93 mmHg. The blood pressure at peak stress was 188/86 mmHg. The blood pressure response was normal. The patient developed fatigue during the stress exam. The symptoms resolved with rest. The patient's functional capacity was excellent.  EKG: Resting EKG showed normal sinus rhythm with left ventricular hypertrophy. The patient developed no abnormal EKG findings during exercise.   2D Echo Findings: The baseline ejection fraction was 60-65%. The peak ejection fraction at stress was 70-75%. Baseline regional wall motion abnormalities were not present. There were no stress-induced wall motion abnormalities. This is a negative stress echocardiogram for ischemia.  Stress Doppler:  LVOT: The baseline LVOT gradient was 6.5 mmHG.  Additional Findings: Severe asymmetric left ventricular hypertrophy observed. IVSd thickness 2cm. No significant LVOT obstruction at baseline, peak gradient of 6 mm HG observed. Normal biventricular function observed. Mild TR and trace MR observed. Estimated RVSP 20mm Hg.   Sreedhar reddy Madireddy Electronically signed on 11/22/2023 at 5:20:20 PM      Final      MONITORS  LONG TERM MONITOR (3-14 DAYS) 12/29/2023  Narrative Patch Wear Time:  13 days and 10 hours (2024-12-07T19:26:49-0500 to 2024-12-21T06:03:44-0500)  Patient had a min HR of 49 bpm, max HR of 162 bpm, and avg HR of 80 bpm.  Predominant underlying rhythm was Sinus Rhythm. Bundle Branch Block/IVCD was present. 4 Supraventricular Tachycardia runs occurred, the run with the fastest interval lasting 4 beats with a max rate of 162 bpm, the longest lasting 8 beats with an avg rate of  112 bpm. Isolated SVEs were rare (<1.0%), SVE Couplets were rare (<1.0%), and SVE Triplets were rare (<1.0%).  Isolated VEs were rare (<1.0%), VE Couplets were rare (<1.0%), and no VE Triplets were present. Ventricular Trigeminy was present.  Impression: Mildly abnormal but largely unremarkable event monitor.  Brief atrial runs noted.     CARDIAC MRI  MR CARDIAC MORPHOLOGY W WO CONTRAST 01/30/2024  Narrative CLINICAL DATA:  LV hypertrophy, assess for HCM  EXAM: CARDIAC MRI  TECHNIQUE: The patient was scanned on a 1.5 Tesla GE magnet. A dedicated cardiac coil was used. Functional imaging was done using Fiesta sequences. 2,3, and 4 chamber views were done to assess for RWMA's. Modified Simpson's rule using a short axis stack was used to calculate an ejection fraction on a dedicated work [employer]. The patient received 10 cc of Gadavist. After 10 minutes inversion recovery sequences were used to assess for infiltration and scar tissue.  FINDINGS: Limited images of the lung fields showed no gross abnormalities.  Normal left ventricular size with mild focal basal septal hypertrophy. No mitral valve  systolic anterior motion noted, no turbulence noted in LV outflow tract. Normal wall motion with LV EF 61%. Normal right ventricular size and systolic function, RV EF 50%. Borderline left atrial enlargement, normal right atrium. Trileaflet aortic valve, no stenosis or regurgitation. Mild mitral regurgitation with regurgitant fraction 10%.  On delayed enhancement imaging, there was subtle mid-wall late gadolinium enhancement (LGE) in the hypertrophied basal septum (<5% total myocardium).  MEASUREMENTS: MEASUREMENTS LVEDV 197 mL  LVEDVi 98 mL/m2  LVSV 120 mL LVEF 61%  RVEDV 217 RVEDVi 107 mL/m2  RVSV 108 mL RVEF 50%  Aortic forward volume 107 mL  Aortic regurgitant fraction 0%  T1 1032, ECV 26%  IMPRESSION: 1. Normal LV size with mild focal basal  septal hypertrophy. LV EF 61%. No mitral valve SAM noted.  2.  Normal RV size and systolic function, EF 50%.  3. There was subtle mid-wall LGE in the hypertrophied basal septum, this is not a coronary disease pattern. <5% total myocardium.  4.  Normal extracellular volume percentage.  This study could be indicative of a variant of hypertrophic cardiomyopathy, but suspect it is a relatively benign variant.  Dalton Mclean   Electronically Signed By: Marca Ancona M.D. On: 01/31/2024 11:46   ______________________________________________________________________________________________      Physical Exam:    VS:  BP 114/80   Pulse 87   Ht 6\' 2"  (1.88 m)   Wt 78.7 kg   SpO2 97%   BMI 22.29 kg/m    Wt Readings from Last 3 Encounters:  04/03/24 78.7 kg  03/05/24 80.1 kg  02/28/24 80.3 kg    Gen: no distress Neck: No JVD Cardiac: No Rubs or Gallops, holosystolic murmur that is soft with standing; loud with squat to stand and Valsalva, RRR +2 radial pulses Respiratory: Clear to auscultation bilaterally, normal effort, normal  respiratory rate GI: Soft, nontender, non-distended  MS: No  edema;  moves all extremities Integument: Skin feels warm Neuro:  At time of evaluation, alert and oriented to person/place/time/situation  Psych: Normal affect, patient feels ok   ASSESSMENT AND PLAN: .     Hypertrophic Cardiomyopathy vs hypertensive phenocopy  - neural septal Variant =- 15- 16 mm on CMR, LGE burden < 5%  - peak gradient 24 mm Hg on stress  - without resting MR, Apical Aneurysm, other considerations - suspicion of Fabry's/Danon/Noonan's or other mimics of HCM: low - Gene variant: Pending - Indexed assessment: [email or web address] - NYHA II with exercise - paroxsymal SVT- asymptomatic - Family history; reviewed his hx of SCD; mothers hx concentric hypertrophy 14 mm in 2024; HFpEF, his two biologic brothers, and fathers' hx (no HCM, AFL and NSVT), Discussed  family screening  - for these reasons genetic testing is appropriate for both him AND his family  SCD  Assessment - Exercise testing normal for sinus rhythm and 24 mm HJg peak gradient and  no change in blood pressure or syncope on exercise testing) - CMR from 2025 notable for for no scar -  2 year assessment for VT on rhythm monitor without - SCD risk estimated to be 2.6% at 5 years SDM: we have discussed not pursing ICD at this time (2A indication; unclear hx in Uncle in the 1990s)   Medication symptom plan - if worsening sx trial of diltiazem 120 mg PO XL  Exercise Prescription  Frequency: three days per week to start  Intensity:  60 to 90 percent of heart rate reserve. 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 Limitations:  Limitation in burst activity (we used heavy weight lifting, and burpees as examples) and squat to stand activity  Time Spent Directly with Patient:   I have spent a total of 62 minutes with the patient reviewing notes, imaging, EKGs,  and examining the patient as well as establishing an assessment and plan that was discussed personally with the patient. Discussed disease state education, using  cardiac modeling, collaboration with clinical genetics, and supporting his work at [employer] with appropriate documentation.  I have cc'ed his mother's cardiologist about his diagnosis.   Riley Lam, MD FASE [facility] Cardiologist [facility]  [address] [place], [address] [place], [place] [address] [phone]  9:15 AM

## 2024-04-05 NOTE — Progress Notes (Signed)
 Pre Test Genetic Consult  Referral Reason  Austin Curry, a new HCM patient, is referred for genetic consult and testing of hypertrophic cardiomyopathy.   Personal Medical Information Austin Curry (III.1 on pedigree) is a 43 year old African American gentleman who reports noticing rapid heartrates, as if he was running, in 2019 while lifting heavy boxes. He had a 14 day heart monitor that did not detect any issues. He works for the [employer] and performs a lot of physical activities that he considers as exercise for him. He reports having asthma at 43 years of age and had to hospitalized in 2017 as his airways were constricted. He notes having shortness of breath but is unsure if it is cardiac or asthma related. Also tells me of having heart palpitations with exertion and fatigue. Denies chest discomfort, dizziness and syncope.  A recent cardiac MRI (01/30/24) demonstrated basal septal hypertrophy of 1.5 cm to 1.6 cm with LVEF of 61% and scar burden less than 5% in the hypertrophied areas.  Traditional Risk Factors Austin Curry reports having HTN at 59 that is well-controlled with medication.  Family history  Relation to Proband Pedigree # Current age Heart condition/age of onset Notes  Children, siblings None           Father II.8 2 None   Paternal uncles, 3 II.1-II.3 Deceased 70s-80s None II.1- Died of cancer @ 50s II.2- Died of old age @ 26  Paternal aunts, 6 II.5-II.9 70s-80s Deceased None II.7-II.9: Died @ 82s of ?  Paternal grandfather 1.1 Deceased None Died @ 66s- cancer  Paternal grandmother I.2 Deceased None Died @ 28s- suddenly- details unknown. Suspect stroke or M.I.        Mother II.11 82 CHF @ 63 LVED 30% Has two more sons (diff father), ages 75 and 29.  Maternal uncles, 2 II.12-II.13 Deceased II.2- CAD II.2- Died during CABG surgery @ 40 II.3- Died of prostate cancer @ 60/70  Maternal grandfather I.3 Deceased None Died @ 7- poor health form Alzheimer's disease and diabetes   Maternal grandmother I.4 Deceased Pacemaker- reason ? Died @ 4- colon cancer   Genetics Austin Curry was counseled on the genetics of hypertrophic cardiomyopathy (HCM). I explained to the patient that this is an autosomal dominant condition with incomplete penetrance i.e. not all individuals harboring the HCM mutation will present clinically with HCM, and age-related penetrance where clinical presentation of HCM increases with advanced age. Variability in clinical expression is also seen in families with HCM with affected family members presenting clinically at different ages and with symptoms ranging from mild to severe.  Since HCM is an autosomal dominant condition, first degree-relatives are at a 50% risk of inheriting this condition. They should seek regular surveillance for HCM.  First-degree relatives include his parents and future children.    Clinical screening of first-degree relatives involves echocardiogram and EKG at regular intervals, frequency is typically determined by age, with children undergoing screening every year until the age of 30 and those over the age of 72 getting screened every 3-5 years until the age of 29. Patient verbalized understanding of this.  Also briefly discussed the inheritance pattern and treatment /management plans for the infiltrative cardiomyopathies that present as HCM phenocopies. About 8-10% of HCM patients can have compound and digenic sarcomeric mutations for HCM  Patient should be aware that genetic testing is a probabilistic test dependent upon age and severity of presentation, presence of risk factors for HCM and importantly family history of HCM or sudden death  in first-degree relatives. The potential outcomes of genetic testing and subsequent management of at-risk family members is listed below-  If a mutation is not identified then it is important that he understands that HCM is a genetic condition and can be passed down to his children. All first-degree  relatives should undergo regular screening for HCM.  A negative test result can be due to limitations of the genetic test.   There is also the likelihood of identifying a "Variant of unknown significance". This result means that the variant has not been detected in a statistically significant number of HCM patients and/or functional studies have not been performed to verify its pathogenicity. This VUS can be tested in the family to see if it segregates with disease. If a VUS is found, first-degree relatives should undergo regular clinical screening for HCM, but genetic testing for the VUS is otherwise not warranted.  If a pathogenic variant is reported, then first-degree family members can get tested for this variant. If they test positive, it is likely they will develop HCM. In light of variable expression and incomplete penetrance associated with HCM, it is not possible to predict when they will manifest clinically with HCM. It is recommended that family members that test positive for the familial pathogenic variant pursue clinical screening for HCM. Family members that test negative for the familial mutation need not pursue periodic screening for HCM, but seek care if symptoms develop.   Impression  Austin Curry was found to have cardiac wall thickness suggestive of HCM at age 13 in the absence of other cardiac loading conditions that can lead to cardiac hypertrophy. There is no family history of HCM or sudden death but reports CHF at a63 in his mother. It is likely he has a de novo mutation for HCM or has inherited this from one of his parents, likely mother who was diagnosed with CHF at 54 in the absence of other risk factors.   Genetic testing is recommended to confirm his diagnosis. This test should include the major sarcomeric genes involved in HCM, namely MYBPPC3, MYH7, TNNI3, TNNT2, TPM1, ACTC1, MYL2 and MYL3. It should also include the genes involved in HCM phenocopies as cardiac-predominant forms of  these conditions present clinically as HCM. These include genes for Fabry disease (GLA), Danon disease (LAMP2), WPW syndrome (PRKAG2), Familial transthyretin amyloidosis (TTR) and phospholamban (PLN).  In addition, patient should be aware of protections afforded by the Genetic Information Non-Discrimination Act (GINA). GINA protects a patient from losing their employment or health insurance based on their genotype. However, these protections do not cover life insurance and disability. Explained to the patient that family members that are found to have the familial genetic mutation will be denied life insurance even if they are asymptomatic and do not exhibit clinical signs of HCM. He verbalized understanding and will discuss this with his two maternal half siblings.   Please note that the patient has not been counseled in this visit on other personal, cultural or ethical issues that he may face due to his heart condition.   Plan After a thorough discussion of the risk and benefits of genetic testing for HCM, Austin Curry expresses interest in pursuing genetic testing for HCM and signed the informed consent. Blood was drawn today for HCM genetic testing.    Sidney Ace, Ph.D, [facility] Clinical Molecular Geneticist

## 2024-04-10 ENCOUNTER — Encounter: Payer: Self-pay | Admitting: Genetic Counselor

## 2024-05-21 DIAGNOSIS — I421 Obstructive hypertrophic cardiomyopathy: Secondary | ICD-10-CM | POA: Diagnosis not present

## 2024-05-22 DIAGNOSIS — I421 Obstructive hypertrophic cardiomyopathy: Secondary | ICD-10-CM | POA: Diagnosis not present

## 2024-05-23 DIAGNOSIS — I421 Obstructive hypertrophic cardiomyopathy: Secondary | ICD-10-CM | POA: Diagnosis not present

## 2024-05-24 DIAGNOSIS — I421 Obstructive hypertrophic cardiomyopathy: Secondary | ICD-10-CM | POA: Diagnosis not present

## 2024-07-31 ENCOUNTER — Encounter: Payer: Self-pay | Admitting: Cardiology

## 2024-07-31 ENCOUNTER — Ambulatory Visit: Attending: Cardiology | Admitting: Cardiology

## 2024-07-31 VITALS — BP 100/70 | HR 68 | Ht 75.0 in | Wt 176.6 lb

## 2024-07-31 DIAGNOSIS — F17210 Nicotine dependence, cigarettes, uncomplicated: Secondary | ICD-10-CM | POA: Diagnosis not present

## 2024-07-31 DIAGNOSIS — I422 Other hypertrophic cardiomyopathy: Secondary | ICD-10-CM | POA: Insufficient documentation

## 2024-07-31 NOTE — Progress Notes (Signed)
 Cardiology Office Note:    Date:  07/31/2024   ID:  Austin Curry, DOB 05-13-1981, MRN [id number]  PCP:  Merna Huxley, NP  Cardiologist:  Jennifer JONELLE Crape, MD   Referring MD: Merna Huxley, NP    ASSESSMENT:    1. Hypertrophic cardiomyopathy (HCC)   2. Cigarette smoker    PLAN:    In order of problems listed above:  Primary prevention stressed with the patient.  Importance of compliance with diet medication stressed and patient verbalized standing. Hypertrophic cardiomyopathy versus hypertensive pheno copy: Discussed this with the patient at length and reviewed the specialist notes.  He is considered low risk for sudden cardiac death in view of his comprehensive evaluation.  I appreciate all consultants input I discussed with the patient at length about this.  He vocalized understanding and questions were answered to satisfaction.  He is asymptomatic at this time.  Has never had a syncopal event. Cigarette smoker: Counseled again to completely quit smoking. Patient will be seen in follow-up appointment in 6 months or earlier if the patient has any concerns.    Medication Adjustments/Labs and Tests Ordered: Current medicines are reviewed at length with the patient today.  Concerns regarding medicines are outlined above.  No orders of the defined types were placed in this encounter.  No orders of the defined types were placed in this encounter.    No chief complaint on file.    History of Present Illness:    Austin Curry is a 43 y.o. male.  Patient was evaluated for the possibility of hypertrophic cardiomyopathy versus phenyl copy for hypertrophic cardiomyopathy.  He denies any problems at this time and takes care of activities of daily living.  No chest pain orthopnea or PND.  At the time of my evaluation, the patient is alert awake oriented and in no distress.  He continues to smoke.  Past Medical History:  Diagnosis Date   Allergic rhinitis 10/31/2023   Allergic  rhinitis due to animal (cat) (dog) hair and dander 10/31/2023   Asthma exacerbation 04/08/2016   PFT's  08/23/16   FEV1 3.97 (94 % ) ratio 73  p 17 % improvement from saba p nothing prior to study  - 02/27/2017  After extensive coaching HFA effectiveness =    90% > change to symb 160 2bid   - Allergy  profile 02/27/2017 >  Eos 0.3 /  IgE  595 multiple Pos RAST      Asthma, chronic 06/25/2013   02/27/2017-respiratory allergy  panel-multiple elevations, highest with [place] grass, Box Elder IgE, common silver birch, oak, pecan hickory tree, ragweed, sheep Sorrell, rough pigweed, IgE-595     02/27/2017-CBC with differential-eosinophils relative 4.2, eosinophils absolute 0.3     08/23/2016-pulmonary function test-FVC FVC 5.0 (98% predicted), postbronchodilator ratio 73, postbronchodilator FEV1 3.   Cigarette smoker 11/01/2023   Dyspnea on exertion 11/01/2023   Environmental allergies    Essential hypertension 06/22/2018   Essential hypertension     History of environmental allergies 03/03/2020   02/27/2017-respiratory allergy  panel-multiple elevations, highest with [place] grass, Box Elder IgE, common silver birch, oak, pecan hickory tree, ragweed, sheep Sorrell, rough [place], IgE-595     02/27/2017-CBC with differential-eosinophils relative 4.2, eosinophils absolute 0.3        History of hypertension    Injury of ligament of right knee    right medial patellofemoral ligament tear   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 tonna  NP  (01-12-2021 per pt last exacerbation approxl. 05/ 2021)   Pain in right knee 06/17/2019   Palpitations 05/30/2018   Palpitations      Right shoulder pain 11/23/2017   Snoring 01/27/2023   Ventral hernia 01/12/2016    Past Surgical History:  Procedure Laterality Date   INSERTION OF MESH N/A 04/18/2016   Procedure: INSERTION OF MESH;  Surgeon: Vicenta Poli, MD;  Location: [facility];  Service: General;  Laterality: N/A;   KNEE ARTHROSCOPY WITH MEDIAL PATELLAR FEMORAL LIGAMENT RECONSTRUCTION Right 02/18/2021   Procedure: RIGHT KNEE ARTHROSCOPYLIMITED SYNOVECTOMY WITH MEDIAL PATELLAR FEMORAL LIGAMENT RECONSTRUCTION WITH HAMSTRING ALLOGRAFT;  Surgeon: Sharl Selinda Dover, MD;  [facility];  Service: Orthopedics;  Laterality: Right;  2 HRS   VENTRAL HERNIA REPAIR N/A 04/18/2016   Procedure:  VENTRAL HERNIA REPAIR;  Surgeon: Vicenta Poli, MD;  Location: [facility];  Service: General;  Laterality: N/A;    Current Medications: Current Meds  Medication Sig   acetaminophen  (TYLENOL ) 500 MG tablet Take 500 mg by mouth every 6 (six) hours as needed for mild pain (pain score 1-3) or moderate pain (pain score 4-6).   albuterol  (PROVENTIL ) (2.5 MG/3ML) 0.083% nebulizer solution Take 3 mLs (2.5 mg total) by nebulization every 4 (four) hours as needed for wheezing or shortness of breath.   albuterol  (VENTOLIN  HFA) 108 (90 Base) MCG/ACT inhaler 2 puffs 4 times daily as needed   budesonide -formoterol  (SYMBICORT ) 80-4.5 MCG/ACT inhaler 2 puffs in the morning AS NEEDED right when you wake up, rinse out your mouth after use, Can repeat in 12 hours later 2 puffs AS NEEDED, rinse after use   buPROPion  (WELLBUTRIN  XL) 300 MG 24 hr tablet Take 1 tablet (300 mg total) by mouth daily.   cetirizine  (ZYRTEC ) 10 MG tablet Take 1 tablet (10 mg total) by mouth daily as needed.   fluticasone  (FLONASE ) 50 MCG/ACT nasal spray Place 1 spray into both nostrils daily.   Multiple Vitamins-Minerals (CENTRUM MEN PO) Take 1 each by mouth daily.     Allergies:   Patient has no known allergies.   Social History   Socioeconomic History    Marital status: Married    Spouse name: Not on file   Number of children: Not on file   Years of education: Not on file   Highest education level: Bachelor's degree (e.g., BA, AB, BS)  Occupational History   Occupation: Industrial/product designer: [employer]  Tobacco Use   Smoking status: Former    Current packs/day: 0.00    Average packs/day: 0.5 packs/day for 15.0 years (7.5 ttl pk-yrs)    Types: Cigarettes    Start date: 12/26/2006    Quit date: 12/26/2021    Years since quitting: 2.5   Smokeless tobacco: Never  Vaping Use   Vaping status: Never Used  Substance and Sexual Activity   Alcohol use: Not Currently    Alcohol/week: 0.0 standard drinks of alcohol    Comment: occasional   Drug use: Never   Sexual activity: Not on file  Other Topics Concern   Not on file  Social History Narrative   Married    No children  Social Drivers of Health   Financial Resource Strain: Medium Risk (12/12/2023)   Overall Financial Resource Strain (CARDIA)    Difficulty of Paying Living Expenses: Somewhat hard  Food Insecurity: Food Insecurity Present (12/12/2023)   Hunger Vital Sign    Worried About Running Out of Food in the Last Year: Sometimes true    Ran Out of Food in the Last Year: Patient declined  Transportation Needs: No Transportation Needs (12/12/2023)   PRAPARE - Administrator, Civil Service (Medical): No    Lack of Transportation (Non-Medical): No  Physical Activity: Sufficiently Active (12/12/2023)   Exercise Vital Sign    Days of Exercise per Week: 5 days    Minutes of Exercise per Session: 150+ min  Stress: Stress Concern Present (12/12/2023)   [employer] of Occupational Health - Occupational Stress Questionnaire    Feeling of Stress : To some extent  Social Connections: Moderately Integrated (12/12/2023)   Social Connection and Isolation Panel    Frequency of Communication with Friends and Family: More than three times a week    Frequency of  Social Gatherings with Friends and Family: Once a week    Attends Religious Services: More than 4 times per year    Active Member of [employer] or Organizations: Yes    Attends Banker Meetings: 1 to 4 times per year    Marital Status: Separated     Family History: The patient's family history includes Asthma in his brother and brother; CVA in his mother; Diabetes in his mother; Hypercholesterolemia in his mother; Hyperlipidemia in his father; Hypertension in his mother.  ROS:   Please see the history of present illness.    All other systems reviewed and are negative.  EKGs/Labs/Other Studies Reviewed:    The following studies were reviewed today: I discussed findings with the patient at length.   Recent Labs: 09/13/2023: ALT 7 09/27/2023: TSH 0.36 02/28/2024: BUN 12; Creatinine, Ser 1.01; Hemoglobin 15.7; Platelets 173; Potassium 4.4; Sodium 139  Recent Lipid Panel    Component Value Date/Time   CHOL 156 09/13/2023 0840   TRIG 64.0 09/13/2023 0840   HDL 44.80 09/13/2023 0840   CHOLHDL 3 09/13/2023 0840   VLDL 12.8 09/13/2023 0840   LDLCALC 98 09/13/2023 0840    Physical Exam:    VS:  BP 100/70   Pulse 68   Ht 6' 3 (1.905 m)   Wt 176 lb 9.6 oz (80.1 kg)   SpO2 98%   BMI 22.07 kg/m     Wt Readings from Last 3 Encounters:  07/31/24 176 lb 9.6 oz (80.1 kg)  05/10/24 173 lb (78.5 kg)  04/03/24 173 lb 9.6 oz (78.7 kg)     GEN: Patient is in no acute distress HEENT: Normal NECK: No JVD; No carotid bruits LYMPHATICS: No lymphadenopathy CARDIAC: Hear sounds regular, 2/6 systolic murmur at the apex. RESPIRATORY:  Clear to auscultation without rales, wheezing or rhonchi  ABDOMEN: Soft, non-tender, non-distended MUSCULOSKELETAL:  No edema; No deformity  SKIN: Warm and dry NEUROLOGIC:  Alert and oriented x 3 PSYCHIATRIC:  Normal affect   Signed, Jennifer JONELLE Crape, MD  07/31/2024 12:06 PM    [facility]

## 2024-07-31 NOTE — Patient Instructions (Signed)

## 2024-10-03 ENCOUNTER — Ambulatory Visit: Payer: Self-pay | Admitting: Adult Health

## 2024-10-03 ENCOUNTER — Ambulatory Visit (INDEPENDENT_AMBULATORY_CARE_PROVIDER_SITE_OTHER): Admitting: Adult Health

## 2024-10-03 VITALS — BP 110/72 | HR 71 | Temp 98.2°F | Ht 73.5 in | Wt 168.0 lb

## 2024-10-03 DIAGNOSIS — Z720 Tobacco use: Secondary | ICD-10-CM

## 2024-10-03 DIAGNOSIS — Z23 Encounter for immunization: Secondary | ICD-10-CM | POA: Diagnosis not present

## 2024-10-03 DIAGNOSIS — E782 Mixed hyperlipidemia: Secondary | ICD-10-CM

## 2024-10-03 DIAGNOSIS — I422 Other hypertrophic cardiomyopathy: Secondary | ICD-10-CM | POA: Diagnosis not present

## 2024-10-03 DIAGNOSIS — Z0000 Encounter for general adult medical examination without abnormal findings: Secondary | ICD-10-CM

## 2024-10-03 DIAGNOSIS — J4541 Moderate persistent asthma with (acute) exacerbation: Secondary | ICD-10-CM | POA: Diagnosis not present

## 2024-10-03 LAB — CBC
HCT: 46.8 % (ref 39.0–52.0)
Hemoglobin: 15.4 g/dL (ref 13.0–17.0)
MCHC: 32.9 g/dL (ref 30.0–36.0)
MCV: 89 fl (ref 78.0–100.0)
Platelets: 187 K/uL (ref 150.0–400.0)
RBC: 5.26 Mil/uL (ref 4.22–5.81)
RDW: 13.5 % (ref 11.5–15.5)
WBC: 5.5 K/uL (ref 4.0–10.5)

## 2024-10-03 LAB — COMPREHENSIVE METABOLIC PANEL WITH GFR
ALT: 7 U/L (ref 0–53)
AST: 14 U/L (ref 0–37)
Albumin: 4.7 g/dL (ref 3.5–5.2)
Alkaline Phosphatase: 43 U/L (ref 39–117)
BUN: 18 mg/dL (ref 6–23)
CO2: 32 meq/L (ref 19–32)
Calcium: 9.3 mg/dL (ref 8.4–10.5)
Chloride: 102 meq/L (ref 96–112)
Creatinine, Ser: 1.04 mg/dL (ref 0.40–1.50)
GFR: 88.42 mL/min (ref >60.00)
Glucose, Bld: 95 mg/dL (ref 70–99)
Potassium: 4.2 meq/L (ref 3.5–5.1)
Sodium: 140 meq/L (ref 135–145)
Total Bilirubin: 0.5 mg/dL (ref 0.2–1.2)
Total Protein: 7.1 g/dL (ref 6.0–8.3)

## 2024-10-03 LAB — TSH: TSH: 0.44 u[IU]/mL (ref 0.35–5.50)

## 2024-10-03 LAB — LIPID PANEL
Cholesterol: 202 mg/dL — ABNORMAL HIGH (ref 0–200)
HDL: 56 mg/dL (ref >39.00)
LDL Cholesterol: 135 mg/dL — ABNORMAL HIGH (ref 0–99)
NonHDL: 146.29
Total CHOL/HDL Ratio: 4
Triglycerides: 57 mg/dL (ref 0.0–149.0)
VLDL: 11.4 mg/dL (ref 0.0–40.0)

## 2024-10-03 NOTE — Patient Instructions (Signed)
 It was great seeing you today   We will follow up with you regarding your lab work   Please let me know if you need anything   Follow up for a nursing visit in one month and 5 months for HPV vaccinations

## 2024-10-03 NOTE — Progress Notes (Signed)
 Subjective:    Patient ID: Austin Curry, male    DOB: 1981/12/09, 43 y.o.   MRN: [id number]  HPI Patient presents for yearly preventative medicine examination. 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  Hypertrophic Cardiomyopathy -has septal thickening measuring 15 to 16 mm by MRI 20 mm by echocardiogram without evidence of left ventricular outflow tract obstruction.  He denies chest pain, orthopnea, or PND unless he lifts something heavy.   Asthma-managed by pulmonary.  Currently prescribed a rescue inhaler as needed and albuterol  nebulizers as needed.  He uses Symbicort  twice daily during the spring and summer.  He reports no recent flares.  Anxiety/Depression-  Was on Wellbutrin  but did not take it very long and took himself off it. His anxiety and depression has been controlled without the medication.   Hyperlipidemia - history of, not currently on medication  Lab Results  Component Value Date   CHOL 156 09/13/2023   HDL 44.80 09/13/2023   LDLCALC 98 09/13/2023   TRIG 64.0 09/13/2023   CHOLHDL 3 09/13/2023   Tobacco Use - Reports that he quit back in June 2025     All immunizations and health maintenance protocols were reviewed with the patient and needed orders were placed.  Appropriate screening laboratory values were ordered for the patient including screening of hyperlipidemia, renal function and hepatic function.  Medication reconciliation,  past medical history, social history, problem list and allergies were reviewed in detail with the patient  Goals were established with regard to weight loss, exercise, and  diet in compliance with medications. He tries to eat healthy and stays active at work  [employer] Readings from Last 3 Encounters:  10/03/24 168 lb (76.2 kg)  07/31/24 176 lb 9.6 oz (80.1 kg)  05/10/24 173 lb (78.5 kg)    Review of Systems  Constitutional: Negative.   HENT: Negative.    Eyes: Negative.   Respiratory: Negative.    Cardiovascular: Negative.   Gastrointestinal: Negative.   Endocrine: Negative.   Genitourinary: Negative.   Musculoskeletal: Negative.   Skin: Negative.   Allergic/Immunologic: Negative.   Neurological: Negative.   Hematological: Negative.   Psychiatric/Behavioral: Negative.    All other systems reviewed and are negative.  Past Medical History:  Diagnosis Date   Allergic rhinitis 10/31/2023   Allergic rhinitis due to animal (cat) (dog) hair and dander 10/31/2023   Asthma exacerbation 04/08/2016   PFT's  08/23/16   FEV1 3.97 (94 % ) ratio 73  p 17 % improvement from saba p nothing prior to study  - 02/27/2017  After extensive coaching HFA effectiveness =    90% > change to symb 160 2bid   - Allergy  profile 02/27/2017 >  Eos 0.3 /  IgE  595 multiple Pos RAST      Asthma, chronic 06/25/2013   02/27/2017-respiratory allergy  panel-multiple elevations, highest with [place] grass,  Box Elder IgE, common silver birch, oak, pecan hickory tree, ragweed, sheep Sorrell, rough pigweed, IgE-595     02/27/2017-CBC with differential-eosinophils relative 4.2, eosinophils absolute 0.3     08/23/2016-pulmonary function test-FVC FVC  5.0 (98% predicted), postbronchodilator ratio 73, postbronchodilator FEV1 3.   Cigarette smoker 11/01/2023   Dyspnea on exertion 11/01/2023   Environmental allergies    Essential hypertension 06/22/2018   Essential hypertension     History of environmental allergies 03/03/2020   02/27/2017-respiratory allergy  panel-multiple elevations, highest with [place] grass, Box Elder IgE, common silver birch, oak, pecan hickory tree, ragweed, sheep Sorrell, rough [place], IgE-595     02/27/2017-CBC with differential-eosinophils relative 4.2, eosinophils absolute 0.3        History of hypertension    Injury of ligament of right knee    right medial patellofemoral ligament tear   Instability of right patellofemoral joint 07/30/2020   Intermittent palpitations 01-12-2021  per pt have been taking toprol  since 09/ 2021 did not think he needed any more, only feel palpitations when lifting something heavy   previous seen by cardiology,  dr court, lov 06-26-2019 pt was released prn basis;  event monitor 06-09-2018 showed ns/ st   Moderate persistent asthma    pulmonology---  b. tonna  NP  (01-12-2021 per pt last exacerbation approxl. 05/ 2021)   Pain in right knee 06/17/2019   Palpitations 05/30/2018   Palpitations     Right shoulder pain 11/23/2017   Snoring 01/27/2023   Ventral hernia 01/12/2016    Social History   Socioeconomic History   Marital status: Legally Separated    Spouse name: Not on file   Number of children: Not on file   Years of education: Not on file   Highest education level: Bachelor's degree (e.g., BA, AB, BS)  Occupational History   Occupation: Industrial/product designer: [employer]  Tobacco Use   Smoking status: Former    Current packs/day: 0.00    Average packs/day: 0.5 packs/day for 15.0 years (7.5 ttl pk-yrs)    Types: Cigarettes    Start date: 12/26/2006    Quit date: 12/26/2021    Years since quitting: 2.7   Smokeless tobacco: Never  Vaping Use   Vaping status: Never Used   Substance and Sexual Activity   Alcohol use: Not Currently    Alcohol/week: 0.0 standard drinks of alcohol    Comment: occasional   Drug use: Never   Sexual activity: Not on file  Other Topics Concern   Not on file  Social History Narrative   Married    No children       Social Drivers of Health   Financial Resource Strain: Low Risk  (10/03/2024)   Overall Financial Resource Strain (CARDIA)    Difficulty of Paying Living Expenses: Not very hard  Food Insecurity: Food Insecurity Present (10/03/2024)   Hunger Vital Sign    Worried About Running Out of Food in the Last Year: Sometimes true    Ran Out of Food in the Last Year: Sometimes true  Transportation Needs: No Transportation Needs (10/03/2024)   PRAPARE - Administrator, Civil Service (Medical): No    Lack of Transportation (Non-Medical): No  Physical Activity: Sufficiently Active (10/03/2024)   Exercise Vital Sign    Days of Exercise per Week: 7 days    Minutes of Exercise per Session: 150+ min  Stress: Stress Concern Present (10/03/2024)   [employer] of Occupational Health - Occupational Stress Questionnaire  Feeling of Stress: To some extent  Social Connections: Moderately Integrated (10/03/2024)   Social Connection and Isolation Panel    Frequency of Communication with Friends and Family: More than three times a week    Frequency of Social Gatherings with Friends and Family: More than three times a week    Attends Religious Services: More than 4 times per year    Active Member of Clubs or Organizations: Yes    Attends Banker Meetings: More than 4 times per year    Marital Status: Separated  Intimate Partner Violence: Unknown (09/13/2022)   Received from [facility]   HITS    Physically Hurt: Not on file    Insult or Talk Down To: Not on file    Threaten Physical Harm: Not on file    Scream or Curse: Not on file    Past Surgical History:  Procedure Laterality Date   INSERTION OF  MESH N/A 04/18/2016   Procedure: INSERTION OF MESH;  Surgeon: Vicenta Poli, MD;  Location: [facility];  Service: General;  Laterality: N/A;   KNEE ARTHROSCOPY WITH MEDIAL PATELLAR FEMORAL LIGAMENT RECONSTRUCTION Right 02/18/2021   Procedure: RIGHT KNEE ARTHROSCOPYLIMITED SYNOVECTOMY WITH MEDIAL PATELLAR FEMORAL LIGAMENT RECONSTRUCTION WITH HAMSTRING ALLOGRAFT;  Surgeon: Sharl Selinda Dover, MD;  Location: [facility];  Service: Orthopedics;  Laterality: Right;  2 HRS   VENTRAL HERNIA REPAIR N/A 04/18/2016   Procedure:  VENTRAL HERNIA REPAIR;  Surgeon: Vicenta Poli, MD;  Location: [facility];  Service: General;  Laterality: N/A;    Family History  Problem Relation Age of Onset   Diabetes Mother    Hypertension Mother    CVA Mother    Hypercholesterolemia Mother    Hyperlipidemia Father    Asthma Brother    Asthma Brother     No Known Allergies  Current Outpatient Medications on File Prior to Visit  Medication Sig Dispense Refill   acetaminophen  (TYLENOL ) 500 MG tablet Take 500 mg by mouth every 6 (six) hours as needed for mild pain (pain score 1-3) or moderate pain (pain score 4-6).     albuterol  (PROVENTIL ) (2.5 MG/3ML) 0.083% nebulizer solution Take 3 mLs (2.5 mg total) by nebulization every 4 (four) hours as needed for wheezing or shortness of breath. 75 mL 3   albuterol  (VENTOLIN  HFA) 108 (90 Base) MCG/ACT inhaler 2 puffs 4 times daily as needed 18 g 3   budesonide -formoterol  (SYMBICORT ) 80-4.5 MCG/ACT inhaler 2 puffs in the morning AS NEEDED right when you wake up, rinse out your mouth after use, Can repeat in 12 hours later 2 puffs AS NEEDED, rinse after use 1 each 12   cetirizine  (ZYRTEC ) 10 MG tablet Take 1 tablet (10 mg total) by mouth daily as needed. 90 tablet 3   fluticasone  (FLONASE ) 50 MCG/ACT nasal spray Place 1 spray into both nostrils daily. 15.8 mL 0   Multiple Vitamins-Minerals (CENTRUM MEN PO) Take 1 each by mouth daily.     No current  facility-administered medications on file prior to visit.    BP 110/72   Pulse 71   Temp 98.2 F (36.8 C) (Oral)   Ht 6' 1.5 (1.867 m)   Wt 168 lb (76.2 kg)   SpO2 97%   BMI 21.86 kg/m       Objective:   Physical Exam Vitals and nursing note reviewed.  Constitutional:      General: He is not in acute distress.    Appearance: Normal appearance. He is not ill-appearing.  HENT:     Head: Normocephalic and atraumatic.     Right Ear: Tympanic membrane, ear canal and external ear normal. There is no impacted cerumen.     Left Ear: Tympanic membrane, ear canal and external ear normal. There is no impacted cerumen.     Nose: Nose normal. No congestion or rhinorrhea.     Mouth/Throat:     Mouth: Mucous membranes are moist.     Pharynx: Oropharynx is clear.  Eyes:     Extraocular Movements: Extraocular movements intact.     Conjunctiva/sclera: Conjunctivae normal.     Pupils: Pupils are equal, round, and reactive to light.  Neck:     Vascular: No carotid bruit.  Cardiovascular:     Rate and Rhythm: Normal rate and regular rhythm.     Pulses: Normal pulses.     Heart sounds: No murmur heard.    No friction rub. No gallop.  Pulmonary:     Effort: Pulmonary effort is normal.     Breath sounds: Normal breath sounds.  Abdominal:     General: Abdomen is flat. Bowel sounds are normal. There is no distension.     Palpations: Abdomen is soft. There is no mass.     Tenderness: There is no abdominal tenderness. There is no guarding or rebound.     Hernia: No hernia is present.  Musculoskeletal:        General: Normal range of motion.     Cervical back: Normal range of motion and neck supple.  Lymphadenopathy:     Cervical: No cervical adenopathy.  Skin:    General: Skin is warm and dry.     Capillary Refill: Capillary refill takes less than 2 seconds.  Neurological:     General: No focal deficit present.     Mental Status: He is alert and oriented to person, place, and time.   Psychiatric:        Mood and Affect: Mood normal.        Behavior: Behavior normal.        Thought Content: Thought content normal.        Judgment: Judgment normal.       Assessment & Plan:  1. Routine general medical examination at a health care facility (Primary) Today patient counseled on age appropriate routine health concerns for screening and prevention, each reviewed and up to date or declined. Immunizations reviewed and up to date or declined. Labs ordered and reviewed. Risk factors for depression reviewed and negative. Hearing function and visual acuity are intact. ADLs screened and addressed as needed. Functional ability and level of safety reviewed and appropriate. Education, counseling and referrals performed based on assessed risks today. Patient provided with a copy of personalized plan for preventive services. - Stay active and eat healthy  - Follow up in one year or sooner if needed   2. Hypertrophic cardiomyopathy (HCC) - Per Cardiology  - Lipid panel; Future - TSH; Future - CBC; Future - Comprehensive metabolic panel with GFR; Future  3. Moderate persistent asthma with exacerbation - Well controlled. Continue with current inhalers  - Lipid panel; Future - TSH; Future - CBC; Future - Comprehensive metabolic panel with GFR; Future  4. Mixed hyperlipidemia - Consider statin  - Lipid panel; Future - TSH; Future - CBC; Future - Comprehensive metabolic panel with GFR; Future  5. Tobacco use - Congratulated on quitting   6. Need for influenza vaccination  - Flu vaccine trivalent PF, 6mos and older(Flulaval,Afluria,Fluarix,Fluzone)  7.  Need for pneumococcal vaccine  - Pneumococcal conjugate vaccine 20-valent (Prevnar 20)  8. Need for HPV vaccination  - HPV 9-valent vaccine,Recombinat - Follow up in 1 month and 5 months to complete vaccination series   Darleene Shape, NP

## 2024-10-08 ENCOUNTER — Ambulatory Visit: Attending: Genetic Counselor | Admitting: Genetic Counselor

## 2024-10-14 NOTE — Progress Notes (Signed)
 Post-test Genetic Consultation notes   Austin Curry is here today for his post-test genetic consult of his HCM genetic test. [name] reports no changes in his medical history or that of his family members. Does state that he gets more fatigued.  I informed him that the HCM genetic test did not detect a pathogenic variant for HCM. However, it reports a variant of unknown significance in a definitive gene, MYL2 that is associated with HCM. 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  Explained to him that MYL2 gene encodes ventricular myosin light chain protein, mutations in which have been seen in patients with HCM. The MYL2 c.163G>T, p.Ala55Ser variant has been reported by [name] al., 2017 in one HCM patient. This variant is absent in large population databases (gnomAD v.4.10,) typical of that expected for a pathogenic variant. Computational algorithms predict a deleterious effect on protein function. However, additional HCM patients and/or functional studies are needed to confirm the pathogenicity of this variant.  In light of the current knowledge base, the MYL2 c.163G>T, p.Ala55Ser variant is classified as a variant of unknown clinical significance   It would be appropriate for his first-degree relatives to pursue cardiology surveillance for HCM by echocardiogram and EKG every 3-5 years. He does not have children or siblings. His mother, now age 9 has CHF and he reports that she would not be interested to undergo testing for this variant to determine if it segregates with disease in his family.   Also reiterated the protections afforded by [employer] and the importance of having life insurance coverage. He verbalized understanding.   [name], Ph.D, [facility] Clinical Molecular Geneticist

## 2024-11-06 ENCOUNTER — Ambulatory Visit: Admitting: Adult Health

## 2024-11-06 ENCOUNTER — Ambulatory Visit (INDEPENDENT_AMBULATORY_CARE_PROVIDER_SITE_OTHER)

## 2024-11-06 DIAGNOSIS — Z23 Encounter for immunization: Secondary | ICD-10-CM

## 2024-11-06 NOTE — Progress Notes (Signed)
 Patient is in office today for a nurse visit for HPV Immunization. Patient Injection was given in the  Right deltoid. Patient tolerated injection well.

## 2024-12-10 ENCOUNTER — Encounter: Payer: Self-pay | Admitting: Adult Health

## 2024-12-10 DIAGNOSIS — J4541 Moderate persistent asthma with (acute) exacerbation: Secondary | ICD-10-CM

## 2024-12-10 MED ORDER — ALBUTEROL SULFATE (2.5 MG/3ML) 0.083% IN NEBU: 2.5000 mg | INHALATION_SOLUTION | RESPIRATORY_TRACT | 0 refills | Status: AC | PRN

## 2025-01-03 ENCOUNTER — Emergency Department (HOSPITAL_BASED_OUTPATIENT_CLINIC_OR_DEPARTMENT_OTHER)
Admission: EM | Admit: 2025-01-03 | Discharge: 2025-01-03 | Disposition: A | Discharge status: Home / Self Care | Attending: Emergency Medicine | Admitting: Emergency Medicine

## 2025-01-03 ENCOUNTER — Other Ambulatory Visit: Payer: Self-pay

## 2025-01-03 ENCOUNTER — Emergency Department (HOSPITAL_BASED_OUTPATIENT_CLINIC_OR_DEPARTMENT_OTHER)

## 2025-01-03 ENCOUNTER — Encounter (HOSPITAL_BASED_OUTPATIENT_CLINIC_OR_DEPARTMENT_OTHER): Payer: Self-pay

## 2025-01-03 DIAGNOSIS — J45909 Unspecified asthma, uncomplicated: Secondary | ICD-10-CM | POA: Insufficient documentation

## 2025-01-03 DIAGNOSIS — Z7951 Long term (current) use of inhaled steroids: Secondary | ICD-10-CM | POA: Diagnosis not present

## 2025-01-03 DIAGNOSIS — W108XXA Fall (on) (from) other stairs and steps, initial encounter: Secondary | ICD-10-CM | POA: Insufficient documentation

## 2025-01-03 DIAGNOSIS — M25511 Pain in right shoulder: Secondary | ICD-10-CM | POA: Insufficient documentation

## 2025-01-03 DIAGNOSIS — M79641 Pain in right hand: Secondary | ICD-10-CM | POA: Insufficient documentation

## 2025-01-03 MED ORDER — METHOCARBAMOL 500 MG PO TABS: 500.0000 mg | ORAL_TABLET | Freq: Two times a day (BID) | ORAL | 0 refills | Status: AC

## 2025-01-03 NOTE — ED Triage Notes (Signed)
 Pt states that he tripped down the steps around midnight last night. Pt reports R hand, shoulder, and hip pain. Little relief with aleve . No thinners.

## 2025-01-03 NOTE — Discharge Instructions (Addendum)
 You are seen in the emergency department today for concerns of a fall.  Your imaging of your right shoulder, hand, and hip were negative for any fracture or other injury.  Based on my exam, I am concerned about your right shoulder due to significant difficulty with range of motion concern for possible rotator cuff injury.  I would recommend placing into a shoulder immobilizer for the next week continue to take Tylenol  ibuprofen , and applying ice to the area.  You may require follow-up with orthopedics if your pain is not improving.

## 2025-01-03 NOTE — ED Provider Notes (Signed)
 " [facility] Provider Note   CSN: [id number] Arrival date & time: 01/03/25  1931     Patient presents with: Austin Curry is a 44 y.o. male.  Patient with past history significant for asthma presents to the emergency department with concerns of a fall.  Reports that he tripped down the steps last night around midnight and landed on his right shoulder, hand, and hip.  Dors is pain primarily to the right hand and shoulder.  Tried taking Aleve  without significant improvement.  He currently works as a environmental manager.  He states that he had some difficulty today with range of motion due to pain in the right shoulder.  Denies any tingling or numbness in the right arm.  Denies head impact, injury, or LOC.   Fall       Prior to Admission medications  Medication Sig Start Date End Date Taking? Authorizing Provider  methocarbamol  (ROBAXIN ) 500 MG tablet Take 1 tablet (500 mg total) by mouth 2 (two) times daily. 01/03/25  Yes Tavita Eastham A, PA-C  acetaminophen  (TYLENOL ) 500 MG tablet Take 500 mg by mouth every 6 (six) hours as needed for mild pain (pain score 1-3) or moderate pain (pain score 4-6).    [provider]  albuterol  (PROVENTIL ) (2.5 MG/3ML) 0.083% nebulizer solution Take 3 mLs (2.5 mg total) by nebulization every 4 (four) hours as needed for wheezing or shortness of breath. 12/10/24   Nafziger, Cory, NP  albuterol  (VENTOLIN  HFA) 108 (90 Base) MCG/ACT inhaler 2 puffs 4 times daily as needed 02/28/24   Kingsley, Victoria K, DO  budesonide -formoterol  (SYMBICORT ) 80-4.5 MCG/ACT inhaler 2 puffs in the morning AS NEEDED right when you wake up, rinse out your mouth after use, Can repeat in 12 hours later 2 puffs AS NEEDED, rinse after use 02/28/24   Kingsley, Victoria K, DO  cetirizine  (ZYRTEC ) 10 MG tablet Take 1 tablet (10 mg total) by mouth daily as needed. 02/15/23   Neda Jennet LABOR, MD  fluticasone  (FLONASE ) 50 MCG/ACT  nasal spray Place 1 spray into both nostrils daily. 02/28/24   Kingsley, Victoria K, DO  Multiple Vitamins-Minerals (CENTRUM MEN PO) Take 1 each by mouth daily.    [provider]    Allergies: Patient has no known allergies.    Review of Systems  Musculoskeletal:        Right shoulder pain  All other systems reviewed and are negative.   Updated Vital Signs BP 134/87 (BP Location: Left Arm)   Pulse 68   Temp 98.1 F (36.7 C)   Resp 17   Ht 6' 2 (1.88 m)   Wt 79.4 kg   SpO2 98%   BMI 22.47 kg/m   Physical Exam Vitals and nursing note reviewed.  Constitutional:      General: He is not in acute distress.    Appearance: He is well-developed.  HENT:     Head: Normocephalic and atraumatic.  Eyes:     Conjunctiva/sclera: Conjunctivae normal.  Cardiovascular:     Rate and Rhythm: Normal rate and regular rhythm.     Heart sounds: No murmur heard. Pulmonary:     Effort: Pulmonary effort is normal. No respiratory distress.     Breath sounds: Normal breath sounds.  Abdominal:     Palpations: Abdomen is soft.     Tenderness: There is no abdominal tenderness.  Musculoskeletal:        General: No swelling. Normal  range of motion.     Cervical back: Neck supple.     Comments: ROM limited due to pain in the right shoulder. Exam limited by general discomfort. Questionable findings with Hawkins, external resisted rotation, and cross body movement of the shoulder.  Skin:    General: Skin is warm and dry.     Capillary Refill: Capillary refill takes less than 2 seconds.  Neurological:     Mental Status: He is alert.  Psychiatric:        Mood and Affect: Mood normal.     (all labs ordered are listed, but only abnormal results are displayed) Labs Reviewed - No data to display  EKG: None  Radiology: DG Hip Unilat  With Pelvis 2-3 Views Right Result Date: 01/03/2025 EXAM: 2 OR MORE VIEW(S) XRAY OF THE RIGHT HIP 01/03/2025 08:03:00 PM COMPARISON: None available. CLINICAL  HISTORY: fall FINDINGS: BONES AND JOINTS: No acute fracture. No malalignment. Well-circumscribed lucent lesion in the right femoral neck favored benign, likely a synovial herniation pit. SOFT TISSUES: Unremarkable. IMPRESSION: 1. No evidence of acute traumatic injury. Electronically signed by: Dorethia Molt MD MD 01/03/2025 08:15 PM EST RP Workstation: [id number]   DG Shoulder Right Result Date: 01/03/2025 EXAM: 1 VIEW(S) XRAY OF THE SHOULDER 01/03/2025 08:03:00 PM COMPARISON: None available. CLINICAL HISTORY: fall FINDINGS: BONES AND JOINTS: Glenohumeral joint is normally aligned. No acute fracture. No malalignment. The [facility] joint is unremarkable. SOFT TISSUES: No abnormal calcifications. Visualized lung is unremarkable. IMPRESSION: 1. No evidence of acute traumatic injury. Electronically signed by: Dorethia Molt MD MD 01/03/2025 08:14 PM EST RP Workstation: [id number]   DG Hand Complete Right Result Date: 01/03/2025 EXAM: 3 OR MORE VIEW(S) XRAY OF THE HAND 01/03/2025 08:03:00 PM COMPARISON: None available. CLINICAL HISTORY: fall FINDINGS: BONES AND JOINTS: No acute fracture. No malalignment. SOFT TISSUES: Unremarkable. IMPRESSION: 1. No evidence of acute traumatic injury. Electronically signed by: Dorethia Molt MD MD 01/03/2025 08:14 PM EST RP Workstation: [id number]     Procedures   Medications Ordered in the ED - No data to display                                  Medical Decision Making Amount and/or Complexity of Data Reviewed Radiology: ordered.   This patient presents to the ED for concern of fall.  Differential diagnosis includes shoulder dislocation, clavicular fracture, hand fracture, hip dislocation    Additional history obtained:  Additional history obtained from chart review   Imaging Studies ordered:  I ordered imaging studies including x-ray of the right shoulder, right hand, hip/right pelvis I independently visualized and interpreted imaging which showed negative for  any acute findings I agree with the radiologist interpretation   Problem List / ED Course:  Patient present to the emergency department concerns of a fall.  Reportedly had a fall down steps and landed on the right side of his body.  Endorses pain primarily to the right shoulder and the right hand.  Has been able to bear weight and ambulate without significant difficulty.  No tingling, numbness, or weakness beyond pain limiting his mobility. Exam is concerning to the right shoulder primarily with possible positive findings of Hawkins, external rotation, and cross body movement.  There is tenderness and difficulty with range of motion in all directions making exam difficult to fully determine exact area of injury.  Will place into a shoulder immobilizer and advise close follow-up with PCP and  likely orthopedics if pain is not improving within the next 1 to 2 weeks.  Encouraged continued use of Tylenol  and ibuprofen .  Return precautions discussed such as concerns for new or worsening symptoms primarily with concerns of neurovascular or neuromuscular compromise. Robaxin  sent to pharmacy. Stable for outpatient follow up and discharged home.   Social Determinants of Health:  None  Final diagnoses:  Acute pain of right shoulder    ED Discharge Orders          Ordered    methocarbamol  (ROBAXIN ) 500 MG tablet  2 times daily        01/03/25 2054               Cecily Legrand LABOR, PA-C 01/03/25 2057    Franklyn Sid SAILOR, MD 01/03/25 2124  "

## 2025-01-07 ENCOUNTER — Ambulatory Visit: Admitting: Physician Assistant

## 2025-04-10 ENCOUNTER — Ambulatory Visit
